# Patient Record
Sex: Male | Born: 1960 | Race: White | Hispanic: No | Smoking: Former smoker
Health system: Southern US, Community
[De-identification: ages and names within clinical notes are randomized; demographics above are authoritative.]

## PROBLEM LIST (undated history)

## (undated) DIAGNOSIS — F410 Panic disorder [episodic paroxysmal anxiety] without agoraphobia: Secondary | ICD-10-CM

## (undated) DIAGNOSIS — G8929 Other chronic pain: Secondary | ICD-10-CM

## (undated) DIAGNOSIS — F17210 Nicotine dependence, cigarettes, uncomplicated: Secondary | ICD-10-CM

## (undated) DIAGNOSIS — M549 Dorsalgia, unspecified: Secondary | ICD-10-CM

## (undated) DIAGNOSIS — F419 Anxiety disorder, unspecified: Secondary | ICD-10-CM

## (undated) DIAGNOSIS — E876 Hypokalemia: Secondary | ICD-10-CM

## (undated) HISTORY — DX: Panic disorder (episodic paroxysmal anxiety): F41.0

## (undated) HISTORY — DX: Hypokalemia: E87.6

## (undated) HISTORY — DX: Anxiety disorder, unspecified: F41.9

## (undated) HISTORY — DX: Dorsalgia, unspecified: M54.9

## (undated) HISTORY — DX: Nicotine dependence, cigarettes, uncomplicated: F17.210

## (undated) HISTORY — DX: Other chronic pain: G89.29

---

## 2005-10-04 ENCOUNTER — Emergency Department: Payer: Self-pay | Admitting: Internal Medicine

## 2008-03-07 HISTORY — PX: COLONOSCOPY: SHX174

## 2008-10-06 ENCOUNTER — Emergency Department: Payer: Self-pay | Admitting: Emergency Medicine

## 2008-12-17 ENCOUNTER — Ambulatory Visit: Payer: Self-pay

## 2008-12-30 ENCOUNTER — Ambulatory Visit: Payer: Self-pay | Admitting: Pain Medicine

## 2009-01-12 ENCOUNTER — Ambulatory Visit: Payer: Self-pay | Admitting: Gastroenterology

## 2009-01-15 ENCOUNTER — Emergency Department: Payer: Self-pay | Admitting: Emergency Medicine

## 2009-01-20 ENCOUNTER — Ambulatory Visit: Payer: Self-pay | Admitting: Physician Assistant

## 2009-02-18 ENCOUNTER — Ambulatory Visit: Payer: Self-pay | Admitting: Family Medicine

## 2009-03-24 ENCOUNTER — Ambulatory Visit: Payer: Self-pay | Admitting: Unknown Physician Specialty

## 2009-03-26 ENCOUNTER — Ambulatory Visit: Payer: Self-pay | Admitting: Unknown Physician Specialty

## 2009-07-28 ENCOUNTER — Ambulatory Visit: Payer: Self-pay | Admitting: Family Medicine

## 2009-08-27 ENCOUNTER — Ambulatory Visit: Payer: Self-pay | Admitting: Family Medicine

## 2009-10-06 ENCOUNTER — Ambulatory Visit: Payer: Self-pay | Admitting: Unknown Physician Specialty

## 2009-12-03 ENCOUNTER — Ambulatory Visit: Payer: Self-pay | Admitting: Unknown Physician Specialty

## 2009-12-08 ENCOUNTER — Inpatient Hospital Stay: Payer: Self-pay | Admitting: Unknown Physician Specialty

## 2010-10-28 ENCOUNTER — Ambulatory Visit: Payer: Self-pay

## 2011-04-28 ENCOUNTER — Ambulatory Visit: Payer: Self-pay

## 2012-08-20 ENCOUNTER — Other Ambulatory Visit (HOSPITAL_COMMUNITY): Payer: Self-pay | Admitting: Neurosurgery

## 2012-08-20 DIAGNOSIS — M545 Low back pain, unspecified: Secondary | ICD-10-CM

## 2012-08-21 ENCOUNTER — Other Ambulatory Visit: Payer: Self-pay | Admitting: Neurosurgery

## 2012-08-22 ENCOUNTER — Ambulatory Visit (HOSPITAL_COMMUNITY)
Admission: RE | Admit: 2012-08-22 | Discharge: 2012-08-22 | Disposition: A | Discharge status: Home / Self Care | Payer: Medicaid Other | Source: Ambulatory Visit | Attending: Neurosurgery | Admitting: Neurosurgery

## 2012-08-22 ENCOUNTER — Encounter (HOSPITAL_COMMUNITY): Payer: Self-pay | Admitting: Pharmacy Technician

## 2012-08-22 ENCOUNTER — Other Ambulatory Visit (HOSPITAL_COMMUNITY): Payer: Self-pay | Admitting: Neurosurgery

## 2012-08-22 DIAGNOSIS — Z981 Arthrodesis status: Secondary | ICD-10-CM | POA: Insufficient documentation

## 2012-08-22 DIAGNOSIS — M545 Low back pain: Secondary | ICD-10-CM

## 2012-08-22 DIAGNOSIS — R52 Pain, unspecified: Secondary | ICD-10-CM

## 2012-08-22 DIAGNOSIS — IMO0002 Reserved for concepts with insufficient information to code with codable children: Secondary | ICD-10-CM | POA: Insufficient documentation

## 2012-08-22 DIAGNOSIS — M5020 Other cervical disc displacement, unspecified cervical region: Secondary | ICD-10-CM | POA: Insufficient documentation

## 2012-08-22 MED ORDER — IOHEXOL 300 MG/ML  SOLN
10.0000 mL | Freq: Once | INTRAMUSCULAR | Status: AC | PRN
  Administered 2012-08-22: 10 mL via INTRATHECAL

## 2012-08-22 MED ORDER — DIAZEPAM 5 MG PO TABS: 10.0000 mg | ORAL_TABLET | Freq: Once | ORAL | Status: AC

## 2012-08-22 MED ORDER — ONDANSETRON HCL 4 MG/2ML IJ SOLN: 4.0000 mg | Freq: Four times a day (QID) | INTRAMUSCULAR | Status: DC | PRN

## 2012-08-22 MED ORDER — OXYCODONE HCL 5 MG PO TABS: 5.0000 mg | ORAL_TABLET | ORAL | Status: DC | PRN

## 2012-08-22 MED ORDER — DIAZEPAM 5 MG PO TABS
ORAL_TABLET | ORAL | Status: AC
  Administered 2012-08-22: 10 mg via ORAL
  Filled 2012-08-22: qty 2

## 2012-08-22 NOTE — Op Note (Signed)
*   No surgery found * Lumbar Myelogram  PATIENT:  Lucas Burke is a 52 y.o. male With pain in the cervical and lumbar regions. He has agreed to undergo an elective myelogram for diagnostic purposes. PRE-OPERATIVE DIAGNOSIS:  Cervicalgia, lumbago  POST-OPERATIVE DIAGNOSIS:  Cervicalgia, lumbago  PROCEDURE:  Lumbar Myelogram  SURGEON:  Adeleine Pask  ANESTHESIA:   local LOCAL MEDICATIONS USED:  LIDOCAINE  and Amount: 4 ml Procedure Note: Lucas Burke was positioned prone on the fluoroscopy table . His back was prepped and draped in a sterile manner. I infiltrated lidocaine into my proposed injection site. With fluoroscopic guidance i placed the needle into the thecal sac. I injected 10cc 300 omnipaque into the thecal sac. I confirmed this with a spot film. Lucas Burke tolerated this procedure without difficulty.    PATIENT DISPOSITION:  PACU - hemodynamically stable.

## 2013-09-12 ENCOUNTER — Encounter: Payer: Self-pay | Admitting: Podiatry

## 2013-10-01 ENCOUNTER — Ambulatory Visit (INDEPENDENT_AMBULATORY_CARE_PROVIDER_SITE_OTHER): Payer: Medicaid Other | Admitting: Podiatry

## 2013-10-01 ENCOUNTER — Encounter: Payer: Self-pay | Admitting: Podiatry

## 2013-10-01 ENCOUNTER — Ambulatory Visit: Payer: Self-pay

## 2013-10-01 VITALS — BP 134/86 | HR 62 | Resp 12

## 2013-10-01 DIAGNOSIS — G5752 Tarsal tunnel syndrome, left lower limb: Secondary | ICD-10-CM

## 2013-10-01 DIAGNOSIS — R52 Pain, unspecified: Secondary | ICD-10-CM

## 2013-10-01 DIAGNOSIS — M7750 Other enthesopathy of unspecified foot: Secondary | ICD-10-CM

## 2013-10-01 MED ORDER — TRIAMCINOLONE ACETONIDE 10 MG/ML IJ SUSP
10.0000 mg | Freq: Once | INTRAMUSCULAR | Status: AC
  Administered 2013-10-01: 10 mg

## 2013-10-01 NOTE — Progress Notes (Signed)
Subjective:     Patient ID: Lucas Burke, male   DOB: [birth year], 53 y.o.   MRN: [id number]  Foot Pain   patient presents stating I'm having a lot of pain in my left ankle over my right and I am due to have my back worked on again and I wanted to see if these may be related. States it's been going on for a fairly long time but it seems to be worse recently   Review of Systems  All other systems reviewed and are negative.      Objective:   Physical Exam  Nursing note and vitals reviewed. Constitutional: He is oriented to person, place, and time.  Cardiovascular: Intact distal pulses.   Musculoskeletal: Normal range of motion.  Neurological: He is oriented to person, place, and time.  Skin: Skin is warm and dry.   neurovascular status found to be intact and muscle strength was noted to be moderately diminished on both sides with diminishment of sharp dull and vibratory testing. 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 I did do an injection into the left tarsal tunnel and into the posterior tibial tendon sheath to try to reduce inflammation and placed into a fascially brace which he stated helped him quite a bit. We may be able to help him that if this is localized but it's hard to make any kind of determination this early as to wear this will go long-term

## 2013-10-01 NOTE — Progress Notes (Signed)
   Subjective:    Patient ID: Lucas Burke, male    DOB: 1960-09-18, 53 y.o.   MRN: [id number]  HPI PT STATED BOTH ANKLE ARE PAINFUL AND HAVE BURNING SENSATION. FEET ARE WORSE NOT GETTING BETTER. THE FEET GET AGGRAVATED BY WALKING. TRIED TO TAKE IBUPROFEN BUT NO HELP.   Review of Systems  All other systems reviewed and are negative.      Objective:   Physical Exam        Assessment & Plan:

## 2013-10-22 ENCOUNTER — Ambulatory Visit: Payer: Medicaid Other | Admitting: Podiatry

## 2015-07-06 ENCOUNTER — Ambulatory Visit
Admission: RE | Admit: 2015-07-06 | Discharge: 2015-07-06 | Disposition: A | Discharge status: Home / Self Care | Payer: Medicaid Other | Source: Ambulatory Visit | Attending: Nurse Practitioner | Admitting: Nurse Practitioner

## 2015-07-06 ENCOUNTER — Other Ambulatory Visit: Payer: Self-pay | Admitting: Nurse Practitioner

## 2015-07-06 DIAGNOSIS — M79672 Pain in left foot: Secondary | ICD-10-CM

## 2015-07-06 DIAGNOSIS — M7732 Calcaneal spur, left foot: Secondary | ICD-10-CM | POA: Insufficient documentation

## 2016-09-27 ENCOUNTER — Emergency Department
Admission: EM | Admit: 2016-09-27 | Discharge: 2016-09-27 | Disposition: A | Discharge status: Home / Self Care | Payer: Medicaid Other | Attending: Emergency Medicine | Admitting: Emergency Medicine

## 2016-09-27 DIAGNOSIS — Z5321 Procedure and treatment not carried out due to patient leaving prior to being seen by health care provider: Secondary | ICD-10-CM | POA: Diagnosis not present

## 2016-09-27 DIAGNOSIS — J029 Acute pharyngitis, unspecified: Secondary | ICD-10-CM | POA: Diagnosis present

## 2016-09-27 NOTE — ED Triage Notes (Signed)
PT reports having had a double ear infection that was treated at [facility]. PT had right ear drum "poped" to relieve swelling. Pt reports pain in ears remains at this time but has improved slightly. Pt also now reports swelling in the right side of neck and nasal drainage. Pt denies sore throat "on the inside" but pain in neck when swallowing. Pt denies fever. No SOB or difficulty breathing. Pt reports he has been taking medications as prescribed for ear infection.

## 2019-01-30 ENCOUNTER — Emergency Department
Admission: EM | Admit: 2019-01-30 | Discharge: 2019-01-30 | Disposition: A | Discharge status: Home / Self Care | Payer: Medicaid Other | Attending: Emergency Medicine | Admitting: Emergency Medicine

## 2019-01-30 ENCOUNTER — Other Ambulatory Visit: Payer: Self-pay

## 2019-01-30 ENCOUNTER — Emergency Department: Payer: Medicaid Other

## 2019-01-30 ENCOUNTER — Encounter: Payer: Self-pay | Admitting: Emergency Medicine

## 2019-01-30 DIAGNOSIS — Y999 Unspecified external cause status: Secondary | ICD-10-CM | POA: Diagnosis not present

## 2019-01-30 DIAGNOSIS — Y929 Unspecified place or not applicable: Secondary | ICD-10-CM | POA: Insufficient documentation

## 2019-01-30 DIAGNOSIS — R42 Dizziness and giddiness: Secondary | ICD-10-CM | POA: Diagnosis not present

## 2019-01-30 DIAGNOSIS — Y939 Activity, unspecified: Secondary | ICD-10-CM | POA: Diagnosis not present

## 2019-01-30 DIAGNOSIS — W208XXA Other cause of strike by thrown, projected or falling object, initial encounter: Secondary | ICD-10-CM | POA: Diagnosis not present

## 2019-01-30 DIAGNOSIS — S0101XA Laceration without foreign body of scalp, initial encounter: Secondary | ICD-10-CM | POA: Diagnosis not present

## 2019-01-30 DIAGNOSIS — S0990XA Unspecified injury of head, initial encounter: Secondary | ICD-10-CM

## 2019-01-30 DIAGNOSIS — F17210 Nicotine dependence, cigarettes, uncomplicated: Secondary | ICD-10-CM | POA: Insufficient documentation

## 2019-01-30 MED ORDER — SODIUM CHLORIDE 0.9 % IV BOLUS: 1000.0000 mL | Freq: Once | INTRAVENOUS | Status: DC

## 2019-01-30 NOTE — Discharge Instructions (Addendum)
Please seek medical attention for any high fevers, chest pain, shortness of breath, change in behavior, persistent vomiting, bloody stool or any other new or concerning symptoms.  

## 2019-01-30 NOTE — ED Provider Notes (Addendum)
[facility] Emergency Department Provider Note   ____________________________________________   I have reviewed the triage vital signs and the nursing notes.   HISTORY  Chief Complaint Head Injury, Head Laceration, and Dizziness   History limited by: Not Limited   HPI Lucas Burke is a 58 y.o. male who presents to the emergency department today because of concern for head injury and dizziness. The patient was putting up christmas decorations when he placed a hammer on the top rung of the ladder. He forgot about it and when he went to move the ladder it fell and hit him on the top of his head. The patient did not lose consciousness nor did he fall to the ground. He did notice some bleeding so applied pressure. Roughly 10 minutes later he started feeling dizzy and light headed, felt like he might pass out. Did have some nausea. Never lost consciousness. Also started developing some neck pain. Denies any recent illness or fevers.    Records reviewed. Per medical record review patient has a history of anxiety, chronic back pain.   Past Medical History:  Diagnosis Date  . Anxiety   . Chronic back pain   . Cigarette smoker   . Hypokalemia   . Panic attacks     There are no active problems to display for this patient.   Past Surgical History:  Procedure Laterality Date  . COLONOSCOPY  2010    Prior to Admission medications   Medication Sig Start Date End Date Taking? Authorizing Provider  gabapentin (NEURONTIN) 300 MG capsule Take 300 mg by mouth 2 (two) times daily.    [provider]  ibuprofen (ADVIL,MOTRIN) 400 MG tablet Take 800 mg by mouth every 6 (six) hours as needed for pain.    [provider]  PARoxetine (PAXIL) 10 MG tablet Take 10 mg by mouth every morning.    [provider]  vitamin B-12 (CYANOCOBALAMIN) 1000 MCG tablet Take 1,000 mcg by mouth daily.    [provider]    Allergies Codeine  No  family history on file.  Social History Social History   Tobacco Use  . Smoking status: Current Every Day Smoker    Packs/day: 2.00  . Smokeless tobacco: Never Used  Substance Use Topics  . Alcohol use: Not Currently  . Drug use: Not on file    Review of Systems Constitutional: No fever/chills Eyes: No visual changes. ENT: No sore throat. Cardiovascular: Denies chest pain. Respiratory: Denies shortness of breath. Gastrointestinal: No abdominal pain.  No nausea, no vomiting.  No diarrhea.   Genitourinary: Negative for dysuria. Musculoskeletal: Positive for neck pain.  Skin: Positive for scalp laceration. Neurological: Positive for headache.  ____________________________________________   PHYSICAL EXAM:  VITAL SIGNS: ED Triage Vitals  Enc Vitals Group     BP 01/30/19 1449 (!) 91/56     Pulse Rate 01/30/19 1449 (!) 57     Resp 01/30/19 1449 14     Temp 01/30/19 1449 97.8 F (36.6 C)     Temp Source 01/30/19 1449 Oral     SpO2 01/30/19 1449 100 %     Weight 01/30/19 1459 175 lb (79.4 kg)     Height 01/30/19 1459 6' (1.829 m)     Head Circumference --      Peak Flow --      Pain Score 01/30/19 1458 0   Constitutional: Alert and oriented.  Eyes: Conjunctivae are normal.  ENT      Head:  Normocephalic and atraumatic.      Nose: No congestion/rhinnorhea.      Mouth/Throat: Mucous membranes are moist.      Neck: No stridor. No midline tenderness.  Hematological/Lymphatic/Immunilogical: No cervical lymphadenopathy. Cardiovascular: Normal rate, regular rhythm.  No murmurs, rubs, or gallops.  Respiratory: Normal respiratory effort without tachypnea nor retractions. Breath sounds are clear and equal bilaterally. No wheezes/rales/rhonchi. Gastrointestinal: Soft and non tender. No rebound. No guarding.  Genitourinary: Deferred Musculoskeletal: Normal range of motion in all extremities. No lower extremity edema. Neurologic:  Normal speech and language. No gross focal  neurologic deficits are appreciated.  Skin:  Roughly 1 cm non bleeding laceration noted to apex of scalp.  Psychiatric: Mood and affect are normal. Speech and behavior are normal. Patient exhibits appropriate insight and judgment.  ____________________________________________    LABS (pertinent positives/negatives)  None  ____________________________________________   EKG  I, Nance Pear, attending physician, personally viewed and interpreted this EKG  EKG Time: 1500 Rate: 57 Rhythm: sinus rhythm Axis: normal Intervals: qtc 405 QRS: narrow ST changes: no st elevation Impression: normal ekg  ____________________________________________    RADIOLOGY  CT head/cervical spine No acute traumatic injuries.   ____________________________________________   PROCEDURES  Procedures  ____________________________________________   INITIAL IMPRESSION / ASSESSMENT AND PLAN / ED COURSE  Pertinent labs & imaging results that were available during my care of the patient were reviewed by me and considered in my medical decision making (see chart for details).   Patient presented to the emergency department today because of concern for head injury after a hammer fell from a hammer and hit him on the top of his head. Was also complaining of some neck pain. CT scan of the head and neck negative for any acute traumatic finding. Patient did feel better while here in the emergency department. Discussed concern for possible concussion and brain rest.   Blood work had been ordered however patient did request discharge prior to blood work resulting. At this point I do think it is reasonable to discharge prior to blood work. Blood pressure did improve and I think it would be unlikely that we would find a concerning abnormality given history of symptoms starting after trauma.  ____________________________________________   FINAL CLINICAL IMPRESSION(S) / ED DIAGNOSES  Final diagnoses:   Injury of head, initial encounter  Laceration of scalp, initial encounter     Note: This dictation was prepared with Dragon dictation. Any transcriptional errors that result from this process are unintentional     Nance Pear, MD 01/30/19 1716    Nance Pear, MD 01/30/19 [phone]

## 2019-01-30 NOTE — ED Triage Notes (Signed)
Pt here via EMS from home after being hit in the top of his head with a hammer which fell from the top of a ladder about 10 feet. Pt states he is dizzy, has blurred vision bilaterally, while in triage, pt became pale and diaphoretic. Lac to top of head noted with bleeding controlled. Alert and oriented. NAD.

## 2019-01-30 NOTE — ED Triage Notes (Signed)
Per ems patient got hit in head with hammer.  Bleeding controlled.  hypotensicve  84/44 with NS running wide open in LFA 18 g IV.

## 2019-01-30 NOTE — ED Notes (Signed)
IV hep lock d/c'd. Steri strips placed on head laC. VSS. PATIENT DISCHARGED TO HOME.

## 2019-05-22 ENCOUNTER — Encounter: Payer: Self-pay | Admitting: Pain Medicine

## 2019-05-22 NOTE — Progress Notes (Signed)
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 impingement.  L4-5: Solid fusion.  Adequate posterior decompression.  No residual neural compression.  Hardware intact.  L5-S1: Solid fusion.  At a posterior decompression.  No residual neural compression.  Hardware intact.  IMPRESSION: Solid L4-S1 fusion.  Severe adjacent segment disease L3-L4 as described, with left greater than right L4 and L3 nerve root impingement.   Original Report Authenticated By: Rolla Flatten, M.D.    Foot Imaging: Foot-L DG Complete:  Results for orders placed during the hospital encounter of 07/06/15  DG Foot Complete Left   Narrative CLINICAL DATA:  Left heel pain and swelling after hitting the heel 4 days ago.  EXAM: LEFT FOOT - COMPLETE 3+ VIEW  COMPARISON:  Left ankle dated 10/01/2013  FINDINGS: Mild posterior and minimal inferior calcaneal spur formation. There is also linear calcification in the distal Achilles tendon. No fracture or dislocation seen. No effusion.  IMPRESSION: 1. No  fracture. 2. Interval calcaneal spurs. 3. Interval distal Achilles calcific tendinosis.   Electronically Signed   By: Claudie Revering M.D.   On: 07/06/2015 15:32  Complexity Note: Imaging results reviewed. Results shared with Mr. Noda, using Layman's terms.                        ROS  Cardiovascular: No reported cardiovascular signs or symptoms such as High blood pressure, coronary artery disease, abnormal heart rate or rhythm, heart attack, blood thinner therapy or heart weakness and/or failure Pulmonary or Respiratory: Smoking Neurological: No reported neurological signs or symptoms such as seizures, abnormal skin sensations, urinary and/or fecal incontinence, being born with an abnormal open spine and/or a tethered spinal cord Psychological-Psychiatric: Anxiousness, Depressed and Prone to panicking Gastrointestinal: No reported gastrointestinal signs or symptoms such as vomiting or evacuating blood, reflux, heartburn, alternating episodes of diarrhea and constipation, inflamed or scarred liver, or pancreas or irrregular and/or infrequent bowel movements Genitourinary: No reported renal or genitourinary signs or symptoms such as difficulty voiding or producing urine, peeing blood, non-functioning kidney, kidney stones, difficulty emptying the bladder, difficulty controlling the flow of urine, or chronic kidney disease Hematological: No reported hematological signs or symptoms such as prolonged bleeding, low or poor functioning platelets, bruising or bleeding easily, hereditary bleeding problems, low energy levels due to low hemoglobin or being anemic Endocrine: No reported endocrine signs or symptoms such as high or low blood sugar, rapid heart rate due to high thyroid levels, obesity or weight gain due to slow thyroid or thyroid disease Rheumatologic: No reported rheumatological signs and symptoms such as fatigue, joint pain, tenderness, swelling, redness, heat, stiffness, decreased range  of motion, with or without associated rash Musculoskeletal: Negative for myasthenia gravis, muscular dystrophy, multiple sclerosis or malignant hyperthermia Work History: Disabled  Allergies  Mr. Habermann is allergic to codeine.  Laboratory Chemistry Profile   Renal No results found for: BUN, CREATININE, LABCREA, BCR, GFR, GFRAA, GFRNONAA, SPECGRAV, PHUR, PROTEINUR  Electrolytes No results found for: NA, K, CL, CALCIUM, MG, PHOS  Hepatic No results found for: AST, ALT, ALBUMIN, ALKPHOS, AMYLASE, LIPASE, AMMONIA  ID No results found for: LYMEIGGIGMAB, HIV, SARSCOV2NAA, STAPHAUREUS, MRSAPCR, HCVAB, PREGTESTUR, RMSFIGG, QFVRPH1IGG, QFVRPH2IGG, LYMEIGGIGMAB  Bone No results found for: VD25OH, GX211HE1DEY, CX4481EH6, DJ4970YO3, 25OHVITD1, 25OHVITD2, 25OHVITD3, TESTOFREE, TESTOSTERONE  Endocrine No results found for: GLUCOSE, GLUCOSEU, HGBA1C, TSH, FREET4, TESTOFREE, TESTOSTERONE, SHBG, ESTRADIOL, ESTRADIOLPCT, ESTRADIOLFRE, LABPREG, ACTH, CRTSLPL, UCORFRPERLTR, UCORFRPERDAY, CORTISOLBASE, LABPREG  Neuropathy No results found for: VITAMINB12, FOLATE, HGBA1C, HIV  CNS No results found for: COLORCSF, APPEARCSF, RBCCOUNTCSF, WBCCSF, POLYSCSF, LYMPHSCSF, EOSCSF, PROTEINCSF, GLUCCSF, JCVIRUS, CSFOLI, IGGCSF, LABACHR, ACETBL, LABACHR, ACETBL  Inflammation (CRP: Acute  ESR: Chronic) No results found for: CRP, ESRSEDRATE, LATICACIDVEN  Rheumatology No results found for: RF, ANA, LABURIC, URICUR, LYMEIGGIGMAB, LYMEABIGMQN, HLAB27  Coagulation No results found for: INR, LABPROT, APTT, PLT, DDIMER, LABHEMA, VITAMINK1, AT3  Cardiovascular No results found for: BNP, CKTOTAL, CKMB, TROPONINI, HGB, HCT, LABVMA, EPIRU, EPINEPH24HUR, NOREPRU, NOREPI24HUR, DOPARU, DOPAM24HRUR  Screening No results found for: SARSCOV2NAA, COVIDSOURCE, STAPHAUREUS, MRSAPCR, HCVAB, HIV, PREGTESTUR  Cancer No results found for: CEA, CA125, LABCA2  Allergens No results found for: ALMOND, APPLE, ASPARAGUS, AVOCADO, BANANA, BARLEY,  BASIL, BAYLEAF, GREENBEAN, LIMABEAN, WHITEBEAN, BEEFIGE, REDBEET, BLUEBERRY, BROCCOLI, CABBAGE, MELON, CARROT, CASEIN, CASHEWNUT, CAULIFLOWER, CELERY    Note: Lab results reviewed.   [place]  Drug: Mr. Gandolfo  has no history on file for drug. Alcohol:  reports previous alcohol use. Tobacco:  reports that he has been smoking. He has been smoking about 2.00 packs per day. He has never used smokeless tobacco. Medical:  has a past medical history of Anxiety, Chronic  back pain, Cigarette smoker, Hypokalemia, and Panic attacks. Family: family history is not on file.  Past Surgical History:  Procedure Laterality Date  . COLONOSCOPY  2010   Active Ambulatory Problems    Diagnosis Date Noted  . Chronic pain syndrome 05/23/2019  . Pharmacologic therapy 05/23/2019  . Disorder of skeletal system 05/23/2019  . Problems influencing health status 05/23/2019  . Long term prescription benzodiazepine use (Alprazolam) 05/23/2019  . Abnormal CT scan, cervical spine (01/30/2019) 05/23/2019  . DDD (degenerative disc disease), cervical 05/23/2019  . Cervical facet arthropathy (Multilevel) (Bilateral) 05/23/2019  . Cervical foraminal stenosis 05/23/2019  . Abnormal myelogram of cervical spine (2014) 05/23/2019  . Cervical Grade 1 Anterolisthesis of C3/C4 05/23/2019  . Abnormal CT myelogram of lumbar spine (2014) 05/23/2019  . Chronic low back pain (2ry area of Pain) (Bilateral) (L>R) 05/23/2019  . Chronic lower extremity pain (1ry area of Pain) (Bilateral) (L>R) 05/23/2019  . Chronic lumbar radiculitis (Left) (L4) 05/23/2019  . Chronic neck pain (3ry area of Pain) (Left) 05/23/2019  . Cervicalgia 05/23/2019  . Chronic thoracic back pain (Midline) 05/23/2019  . Chronic hip pain (Left) 05/23/2019  . Chronic groin pain (Left) 05/23/2019   Resolved Ambulatory Problems    Diagnosis Date Noted  . No Resolved Ambulatory Problems   Past Medical History:  Diagnosis Date  . Anxiety   . Chronic back pain    . Cigarette smoker   . Hypokalemia   . Panic attacks    Constitutional Exam  General appearance: Well nourished, well developed, and well hydrated. In no apparent acute distress Vitals:   05/22/19 1010  BP: 125/84  Pulse: (!) 58  Resp: 18  Temp: 98 F (36.7 C)  SpO2: 97%  Weight: 175 lb (79.4 kg)  Height: 6' (1.829 m)   BMI Assessment: Estimated body mass index is 23.73 kg/m as calculated from the following:   Height as of this encounter: 6' (1.829 m).   Weight as of this encounter: 175 lb (79.4 kg).  BMI interpretation table: BMI level Category Range association with higher incidence of chronic pain  <18 kg/m2 Underweight   18.5-24.9 kg/m2 Ideal body weight   25-29.9 kg/m2 Overweight Increased incidence by 20%  30-34.9 kg/m2 Obese (Class I) Increased incidence by 68%  35-39.9 kg/m2 Severe obesity (Class II) Increased incidence by 136%  >40 kg/m2 Extreme obesity (Class III) Increased incidence by 254%   Patient's current BMI Ideal Body weight  Body mass index is 23.73 kg/m. Ideal body weight: 77.6 kg (171 lb 1.2 oz) Adjusted ideal body weight: 78.3 kg (172 lb 10.3 oz)   BMI Readings from Last 4 Encounters:  05/22/19 23.73 kg/m  01/30/19 23.73 kg/m  09/27/16 26.31 kg/m  08/22/12 30.52 kg/m   Wt Readings from Last 4 Encounters:  05/22/19 175 lb (79.4 kg)  01/30/19 175 lb (79.4 kg)  09/27/16 194 lb (88 kg)  08/22/12 225 lb (102.1 kg)    Psych/Mental status: Alert, oriented x 3 (person, place, & time)       Eyes: PERLA Respiratory: No evidence of acute respiratory distress  Cervical Spine Exam  Skin & Axial Inspection: No masses, redness, edema, swelling, or associated skin lesions Alignment: Symmetrical Functional ROM: Decreased ROMHe indicated experiencing some pain on the left side of the neck when turning or rotating his head towards the right as well as with hyperextension. Stability: No instability detected Muscle Tone/Strength: Functionally intact.  No obvious neuro-muscular anomalies detected. Sensory (Neurological): Unimpaired Palpation: No palpable anomalies  Upper Extremity (UE) Exam    Side: Right upper extremity  Side: Left upper extremity  Skin & Extremity Inspection: Skin color, temperature, and hair growth are WNL. No peripheral edema or cyanosis. No masses, redness, swelling, asymmetry, or associated skin lesions. No contractures.  Skin & Extremity Inspection: Skin color, temperature, and hair growth are WNL. No peripheral edema or cyanosis. No masses, redness, swelling, asymmetry, or associated skin lesions. No contractures.  Functional ROM: Unrestricted ROM          Functional ROM: Unrestricted ROM          Muscle Tone/Strength: Functionally intact. No obvious neuro-muscular anomalies detected.  Muscle Tone/Strength: Functionally intact. No obvious neuro-muscular anomalies detected.  Sensory (Neurological): Unimpaired          Sensory (Neurological): Unimpaired          Palpation: No palpable anomalies              Palpation: No palpable anomalies              Provocative Test(s):  Phalen's test: deferred Tinel's test: deferred Apley's scratch test (touch opposite shoulder):  Action 1 (Across chest): deferred Action 2 (Overhead): deferred Action 3 (LB reach): deferred   Provocative Test(s):  Phalen's test: deferred Tinel's test: deferred Apley's scratch test (touch opposite shoulder):  Action 1 (Across chest): deferred Action 2 (Overhead): deferred Action 3 (LB reach): deferred    Thoracic Spine Area Exam  Skin & Axial Inspection: No masses, redness, or swelling Alignment: Symmetrical Functional ROM: Unrestricted ROM Stability: No instability detected Muscle Tone/Strength: Functionally intact. No obvious neuro-muscular anomalies detected. Sensory (Neurological): Unimpaired Muscle strength & Tone: No palpable anomalies  Lumbar Exam  Skin & Axial Inspection: No masses, redness, or swelling Alignment:  Symmetrical Functional ROM: Minimal ROM affecting both sides Stability: No instability detected Muscle Tone/Strength: Guarding detected Sensory (Neurological): Movement-associated pain Palpation: Complains of area being tender to palpation       Provocative Tests: Hyperextension/rotation test: (+) bilaterally for facet joint pain. Lumbar quadrant test (Kemp's test): (+) bilaterally for facet joint pain. Lateral bending test: deferred today       Patrick's Maneuver: (+) for left hip arthralgia             FABER* test: (+) for left hip arthralgia             S-I anterior distraction/compression test: deferred today         S-I lateral compression test: deferred today         S-I Thigh-thrust test: deferred today         S-I Gaenslen's test: deferred today         *(Flexion, ABduction and External Rotation)  Gait & Posture Assessment  Ambulation: Unassisted Gait: Relatively normal for age and body habitus Posture: WNL   Lower Extremity Exam    Side: Right lower extremity  Side: Left lower extremity  Stability: No instability observed          Stability: No instability observed          Skin & Extremity Inspection: Skin color, temperature, and hair growth are WNL. No peripheral edema or cyanosis. No masses, redness, swelling, asymmetry, or associated skin lesions. No contractures.  Skin & Extremity Inspection: Skin color, temperature, and hair growth are WNL. No peripheral edema or cyanosis. No masses, redness, swelling, asymmetry, or associated skin lesions. No contractures.  Functional ROM: Unrestricted ROM  Functional ROM: Unrestricted ROM                  Muscle Tone/Strength: Able to Toe-walk & Heel-walk without problems  Muscle Tone/Strength: Able to Toe-walk & Heel-walk without problems  Sensory (Neurological): Unimpaired        Sensory (Neurological): Dermatomal pain pattern medial portion of foot (L4)  DTR: Patellar: deferred today Achilles: deferred  today Plantar: deferred today  DTR: Patellar: deferred today Achilles: deferred today Plantar: deferred today  Palpation: No palpable anomalies  Palpation: No palpable anomalies   Assessment  Primary Diagnosis & Pertinent Problem List: The primary encounter diagnosis was Chronic pain syndrome. Diagnoses of Chronic lower extremity pain (1ry area of Pain) (Bilateral) (L>R), Abnormal CT myelogram of lumbar spine (2014), Chronic lumbar radiculitis (Left) (L4), Chronic groin pain (Left), Chronic hip pain (Left), Chronic low back pain (2ry area of Pain) (Bilateral) (L>R), Chronic neck pain (3ry area of Pain) (Left), Cervicalgia, Abnormal myelogram of cervical spine (2014), Abnormal CT scan, cervical spine (01/30/2019), DDD (degenerative disc disease), cervical, Cervical Grade 1 Anterolisthesis of C3/C4, Cervical facet arthropathy (Multilevel) (Bilateral), Cervical foraminal stenosis, Chronic thoracic back pain (Midline), Pharmacologic therapy, Disorder of skeletal system, Problems influencing health status, and Long term prescription benzodiazepine use (Alprazolam) were also pertinent to this visit.  Visit Diagnosis (New problems to examiner): 1. Chronic pain syndrome   2. Chronic lower extremity pain (1ry area of Pain) (Bilateral) (L>R)   3. Abnormal CT myelogram of lumbar spine (2014)   4. Chronic lumbar radiculitis (Left) (L4)   5. Chronic groin pain (Left)   6. Chronic hip pain (Left)   7. Chronic low back pain (2ry area of Pain) (Bilateral) (L>R)   8. Chronic neck pain (3ry area of Pain) (Left)   9. Cervicalgia   10. Abnormal myelogram of cervical spine (2014)   11. Abnormal CT scan, cervical spine (01/30/2019)   12. DDD (degenerative disc disease), cervical   13. Cervical Grade 1 Anterolisthesis of C3/C4   14. Cervical facet arthropathy (Multilevel) (Bilateral)   15. Cervical foraminal stenosis   16. Chronic thoracic back pain (Midline)   17. Pharmacologic therapy   18. Disorder of  skeletal system   19. Problems influencing health status   20. Long term prescription benzodiazepine use (Alprazolam)    Plan of Care (Initial workup plan)  Note: Mr. Parham was reminded that as per protocol, today's visit has been an evaluation only. We have not taken over the patient's controlled substance management.  Problem-specific plan: No problem-specific Assessment & Plan notes found for this encounter.   Lab Orders     Compliance Drug Analysis, Ur     Comp. Metabolic Panel (12)     Magnesium     Vitamin B12     Sedimentation rate     25-Hydroxy vitamin D Lcms D2+D3     C-reactive protein  Imaging Orders     DG HIP UNILAT W OR W/O PELVIS 2-3 VIEWS LEFT     DG Lumbar Spine Complete W/Bend     CT LUMBAR SPINE W CONTRAST     DG Thoracic Spine 2 View Referral Orders  No referral(s) requested today   Procedure Orders    No procedure(s) ordered today   Pharmacotherapy (current): Medications ordered:  No orders of the defined types were placed in this encounter.  Medications administered during this visit: Lucas Burke had no medications administered during this visit.   Pharmacological management options:  Opioid Analgesics: The patient was  informed that there is no guarantee that he would be a candidate for opioid analgesics. The decision will be made following CDC guidelines. This decision will be based on the results of diagnostic studies, as well as Mr. Mander risk profile.   Membrane stabilizer: To be determined at a later time  Muscle relaxant: To be determined at a later time  NSAID: To be determined at a later time  Other analgesic(s): To be determined at a later time   Interventional management options: Mr. Emma was informed that there is no guarantee that he would be a candidate for interventional therapies. The decision will be based on the results of diagnostic studies, as well as Mr. Harman risk profile.  Procedure(s) under consideration:   Diagnostic left caudal ESI + epidurogram  Diagnostic left IA hip joint injection  Diagnostic bilateral lumbar facet block    Provider-requested follow-up: Return for (VV), (2V), (s/p Tests).  No future appointments.  Note by: Gaspar Cola, MD Date: 05/23/2019; Time: 2:29 PM

## 2019-05-23 ENCOUNTER — Other Ambulatory Visit: Payer: Self-pay

## 2019-05-23 ENCOUNTER — Ambulatory Visit: Payer: Medicaid Other | Attending: Pain Medicine | Admitting: Pain Medicine

## 2019-05-23 ENCOUNTER — Encounter: Payer: Self-pay | Admitting: Pain Medicine

## 2019-05-23 VITALS — BP 125/84 | HR 58 | Temp 98.0°F | Resp 18 | Ht 72.0 in | Wt 175.0 lb

## 2019-05-23 DIAGNOSIS — M4310 Spondylolisthesis, site unspecified: Secondary | ICD-10-CM

## 2019-05-23 DIAGNOSIS — G8929 Other chronic pain: Secondary | ICD-10-CM

## 2019-05-23 DIAGNOSIS — M5416 Radiculopathy, lumbar region: Secondary | ICD-10-CM | POA: Diagnosis present

## 2019-05-23 DIAGNOSIS — R9089 Other abnormal findings on diagnostic imaging of central nervous system: Secondary | ICD-10-CM | POA: Insufficient documentation

## 2019-05-23 DIAGNOSIS — M542 Cervicalgia: Secondary | ICD-10-CM | POA: Insufficient documentation

## 2019-05-23 DIAGNOSIS — M79604 Pain in right leg: Secondary | ICD-10-CM

## 2019-05-23 DIAGNOSIS — M47812 Spondylosis without myelopathy or radiculopathy, cervical region: Secondary | ICD-10-CM | POA: Diagnosis present

## 2019-05-23 DIAGNOSIS — G894 Chronic pain syndrome: Secondary | ICD-10-CM | POA: Diagnosis present

## 2019-05-23 DIAGNOSIS — M25552 Pain in left hip: Secondary | ICD-10-CM | POA: Diagnosis present

## 2019-05-23 DIAGNOSIS — M4802 Spinal stenosis, cervical region: Secondary | ICD-10-CM | POA: Diagnosis present

## 2019-05-23 DIAGNOSIS — M899 Disorder of bone, unspecified: Secondary | ICD-10-CM

## 2019-05-23 DIAGNOSIS — R937 Abnormal findings on diagnostic imaging of other parts of musculoskeletal system: Secondary | ICD-10-CM

## 2019-05-23 DIAGNOSIS — Z79899 Other long term (current) drug therapy: Secondary | ICD-10-CM

## 2019-05-23 DIAGNOSIS — M5030 Other cervical disc degeneration, unspecified cervical region: Secondary | ICD-10-CM | POA: Diagnosis present

## 2019-05-23 DIAGNOSIS — M5442 Lumbago with sciatica, left side: Secondary | ICD-10-CM

## 2019-05-23 DIAGNOSIS — M546 Pain in thoracic spine: Secondary | ICD-10-CM

## 2019-05-23 DIAGNOSIS — Z789 Other specified health status: Secondary | ICD-10-CM

## 2019-05-23 DIAGNOSIS — M79605 Pain in left leg: Secondary | ICD-10-CM

## 2019-05-23 DIAGNOSIS — R1032 Left lower quadrant pain: Secondary | ICD-10-CM

## 2019-05-23 NOTE — Progress Notes (Signed)
Safety precautions to be maintained throughout the outpatient stay will include: orient to surroundings, keep bed in low position, maintain call bell within reach at all times, provide assistance with transfer out of bed and ambulation.  

## 2019-05-28 ENCOUNTER — Other Ambulatory Visit: Payer: Self-pay | Admitting: Pain Medicine

## 2019-05-28 DIAGNOSIS — F1290 Cannabis use, unspecified, uncomplicated: Secondary | ICD-10-CM | POA: Insufficient documentation

## 2019-05-28 LAB — COMPLIANCE DRUG ANALYSIS, UR

## 2019-05-29 LAB — COMP. METABOLIC PANEL (12)
AST: 17 IU/L (ref 0–40)
Albumin/Globulin Ratio: 1.6 (ref 1.2–2.2)
Albumin: 4.4 g/dL (ref 3.8–4.9)
Alkaline Phosphatase: 105 IU/L (ref 39–117)
BUN/Creatinine Ratio: 11 (ref 10–24)
BUN: 16 mg/dL (ref 8–27)
Bilirubin Total: 0.3 mg/dL (ref 0.0–1.2)
Calcium: 10.1 mg/dL (ref 8.6–10.2)
Chloride: 103 mmol/L (ref 96–106)
Creatinine, Ser: 1.42 mg/dL — ABNORMAL HIGH (ref 0.76–1.27)
GFR calc Af Amer: 62 mL/min/{1.73_m2} (ref >59)
GFR calc non Af Amer: 53 mL/min/{1.73_m2} — ABNORMAL LOW (ref >59)
Globulin, Total: 2.8 g/dL (ref 1.5–4.5)
Glucose: 71 mg/dL (ref 65–99)
Potassium: 5 mmol/L (ref 3.5–5.2)
Sodium: 141 mmol/L (ref 134–144)
Total Protein: 7.2 g/dL (ref 6.0–8.5)

## 2019-05-29 LAB — C-REACTIVE PROTEIN: CRP: 4 mg/L (ref 0–10)

## 2019-05-29 LAB — 25-HYDROXY VITAMIN D LCMS D2+D3
25-Hydroxy, Vitamin D-2: <1 ng/mL
25-Hydroxy, Vitamin D-3: 39 ng/mL
25-Hydroxy, Vitamin D: 39 ng/mL

## 2019-05-29 LAB — MAGNESIUM: Magnesium: 1.9 mg/dL (ref 1.6–2.3)

## 2019-05-29 LAB — SEDIMENTATION RATE: Sed Rate: 21 mm/hr (ref 0–30)

## 2019-05-29 LAB — VITAMIN B12: Vitamin B-12: 463 pg/mL (ref 232–1245)

## 2021-05-22 ENCOUNTER — Other Ambulatory Visit: Payer: Self-pay

## 2021-05-22 ENCOUNTER — Inpatient Hospital Stay
Admission: EM | Admit: 2021-05-22 | Discharge: 2021-05-30 | DRG: 871 | Disposition: A | Discharge status: Other Acute Inpatient Hospital | Payer: Medicaid Other | Attending: Internal Medicine | Admitting: Internal Medicine

## 2021-05-22 ENCOUNTER — Emergency Department: Payer: Medicaid Other

## 2021-05-22 DIAGNOSIS — G8929 Other chronic pain: Secondary | ICD-10-CM | POA: Diagnosis present

## 2021-05-22 DIAGNOSIS — R5381 Other malaise: Secondary | ICD-10-CM

## 2021-05-22 DIAGNOSIS — J9601 Acute respiratory failure with hypoxia: Secondary | ICD-10-CM | POA: Diagnosis present

## 2021-05-22 DIAGNOSIS — A419 Sepsis, unspecified organism: Principal | ICD-10-CM | POA: Diagnosis present

## 2021-05-22 DIAGNOSIS — Z6821 Body mass index (BMI) 21.0-21.9, adult: Secondary | ICD-10-CM

## 2021-05-22 DIAGNOSIS — Z20822 Contact with and (suspected) exposure to covid-19: Secondary | ICD-10-CM | POA: Diagnosis present

## 2021-05-22 DIAGNOSIS — C3492 Malignant neoplasm of unspecified part of left bronchus or lung: Secondary | ICD-10-CM | POA: Diagnosis not present

## 2021-05-22 DIAGNOSIS — E46 Unspecified protein-calorie malnutrition: Secondary | ICD-10-CM | POA: Insufficient documentation

## 2021-05-22 DIAGNOSIS — J9811 Atelectasis: Secondary | ICD-10-CM | POA: Diagnosis present

## 2021-05-22 DIAGNOSIS — Z885 Allergy status to narcotic agent status: Secondary | ICD-10-CM

## 2021-05-22 DIAGNOSIS — F410 Panic disorder [episodic paroxysmal anxiety] without agoraphobia: Secondary | ICD-10-CM | POA: Diagnosis present

## 2021-05-22 DIAGNOSIS — D75839 Thrombocytosis, unspecified: Secondary | ICD-10-CM

## 2021-05-22 DIAGNOSIS — E209 Hypoparathyroidism, unspecified: Secondary | ICD-10-CM | POA: Diagnosis not present

## 2021-05-22 DIAGNOSIS — R638 Other symptoms and signs concerning food and fluid intake: Secondary | ICD-10-CM | POA: Diagnosis present

## 2021-05-22 DIAGNOSIS — J189 Pneumonia, unspecified organism: Secondary | ICD-10-CM | POA: Diagnosis present

## 2021-05-22 DIAGNOSIS — E8809 Other disorders of plasma-protein metabolism, not elsewhere classified: Secondary | ICD-10-CM | POA: Diagnosis present

## 2021-05-22 DIAGNOSIS — R443 Hallucinations, unspecified: Secondary | ICD-10-CM | POA: Diagnosis not present

## 2021-05-22 DIAGNOSIS — E611 Iron deficiency: Secondary | ICD-10-CM | POA: Diagnosis present

## 2021-05-22 DIAGNOSIS — R634 Abnormal weight loss: Secondary | ICD-10-CM | POA: Diagnosis not present

## 2021-05-22 DIAGNOSIS — F17210 Nicotine dependence, cigarettes, uncomplicated: Secondary | ICD-10-CM | POA: Diagnosis present

## 2021-05-22 DIAGNOSIS — R6520 Severe sepsis without septic shock: Secondary | ICD-10-CM | POA: Diagnosis present

## 2021-05-22 DIAGNOSIS — R17 Unspecified jaundice: Secondary | ICD-10-CM | POA: Diagnosis present

## 2021-05-22 DIAGNOSIS — E43 Unspecified severe protein-calorie malnutrition: Secondary | ICD-10-CM | POA: Diagnosis present

## 2021-05-22 DIAGNOSIS — J90 Pleural effusion, not elsewhere classified: Secondary | ICD-10-CM | POA: Diagnosis present

## 2021-05-22 DIAGNOSIS — E860 Dehydration: Secondary | ICD-10-CM | POA: Diagnosis present

## 2021-05-22 DIAGNOSIS — R451 Restlessness and agitation: Secondary | ICD-10-CM | POA: Diagnosis not present

## 2021-05-22 DIAGNOSIS — J869 Pyothorax without fistula: Secondary | ICD-10-CM | POA: Diagnosis present

## 2021-05-22 DIAGNOSIS — M549 Dorsalgia, unspecified: Secondary | ICD-10-CM | POA: Diagnosis present

## 2021-05-22 DIAGNOSIS — D509 Iron deficiency anemia, unspecified: Secondary | ICD-10-CM | POA: Diagnosis present

## 2021-05-22 DIAGNOSIS — Z79899 Other long term (current) drug therapy: Secondary | ICD-10-CM

## 2021-05-22 DIAGNOSIS — R251 Tremor, unspecified: Secondary | ICD-10-CM

## 2021-05-22 LAB — CBC WITH DIFFERENTIAL/PLATELET
Abs Immature Granulocytes: 0.22 10*3/uL — ABNORMAL HIGH (ref 0.00–0.07)
Basophils Absolute: 0.1 10*3/uL (ref 0.0–0.1)
Basophils Relative: 0 %
Eosinophils Absolute: 0 10*3/uL (ref 0.0–0.5)
Eosinophils Relative: 0 %
HCT: 37.6 % — ABNORMAL LOW (ref 39.0–52.0)
Hemoglobin: 12 g/dL — ABNORMAL LOW (ref 13.0–17.0)
Immature Granulocytes: 1 %
Lymphocytes Relative: 4 %
Lymphs Abs: 1 10*3/uL (ref 0.7–4.0)
MCH: 25.6 pg — ABNORMAL LOW (ref 26.0–34.0)
MCHC: 31.9 g/dL (ref 30.0–36.0)
MCV: 80.3 fL (ref 80.0–100.0)
Monocytes Absolute: 2.6 10*3/uL — ABNORMAL HIGH (ref 0.1–1.0)
Monocytes Relative: 10 %
Neutro Abs: 21.8 10*3/uL — ABNORMAL HIGH (ref 1.7–7.7)
Neutrophils Relative %: 85 %
Platelets: 712 10*3/uL — ABNORMAL HIGH (ref 150–400)
RBC: 4.68 MIL/uL (ref 4.22–5.81)
RDW: 13.1 % (ref 11.5–15.5)
WBC: 25.7 10*3/uL — ABNORMAL HIGH (ref 4.0–10.5)
nRBC: 0 % (ref 0.0–0.2)

## 2021-05-22 LAB — COMPREHENSIVE METABOLIC PANEL
ALT: 18 U/L (ref 0–44)
AST: 21 U/L (ref 15–41)
Albumin: 2.1 g/dL — ABNORMAL LOW (ref 3.5–5.0)
Alkaline Phosphatase: 168 U/L — ABNORMAL HIGH (ref 38–126)
Anion gap: 8 (ref 5–15)
BUN: 41 mg/dL — ABNORMAL HIGH (ref 8–23)
CO2: 27 mmol/L (ref 22–32)
Calcium: 12.7 mg/dL — ABNORMAL HIGH (ref 8.9–10.3)
Chloride: 100 mmol/L (ref 98–111)
Creatinine, Ser: 1.25 mg/dL — ABNORMAL HIGH (ref 0.61–1.24)
GFR, Estimated: >60 mL/min (ref >60)
Glucose, Bld: 110 mg/dL — ABNORMAL HIGH (ref 70–99)
Potassium: 4.5 mmol/L (ref 3.5–5.1)
Sodium: 135 mmol/L (ref 135–145)
Total Bilirubin: 1.3 mg/dL — ABNORMAL HIGH (ref 0.3–1.2)
Total Protein: 7.3 g/dL (ref 6.5–8.1)

## 2021-05-22 LAB — URINALYSIS, ROUTINE W REFLEX MICROSCOPIC
Bilirubin Urine: NEGATIVE
Glucose, UA: NEGATIVE mg/dL
Hgb urine dipstick: NEGATIVE
Ketones, ur: NEGATIVE mg/dL
Leukocytes,Ua: NEGATIVE
Nitrite: NEGATIVE
Protein, ur: NEGATIVE mg/dL
Specific Gravity, Urine: 1.024 (ref 1.005–1.030)
pH: 5 (ref 5.0–8.0)

## 2021-05-22 LAB — TROPONIN I (HIGH SENSITIVITY): Troponin I (High Sensitivity): 9 ng/L (ref <18)

## 2021-05-22 LAB — LIPASE, BLOOD: Lipase: 33 U/L (ref 11–51)

## 2021-05-22 LAB — RESP PANEL BY RT-PCR (FLU A&B, COVID) ARPGX2
Influenza A by PCR: NEGATIVE
Influenza B by PCR: NEGATIVE
SARS Coronavirus 2 by RT PCR: NEGATIVE

## 2021-05-22 MED ORDER — LACTATED RINGERS IV BOLUS
1000.0000 mL | Freq: Once | INTRAVENOUS | Status: AC
  Administered 2021-05-22: 1000 mL via INTRAVENOUS

## 2021-05-22 MED ORDER — ACETAMINOPHEN 325 MG PO TABS
650.0000 mg | ORAL_TABLET | Freq: Four times a day (QID) | ORAL | Status: DC | PRN
  Administered 2021-05-22 – 2021-05-28 (×13): 650 mg via ORAL
  Filled 2021-05-22 (×13): qty 2

## 2021-05-22 MED ORDER — ACETAMINOPHEN 650 MG RE SUPP: 650.0000 mg | Freq: Four times a day (QID) | RECTAL | Status: DC | PRN

## 2021-05-22 MED ORDER — ONDANSETRON HCL 4 MG/2ML IJ SOLN
4.0000 mg | Freq: Four times a day (QID) | INTRAMUSCULAR | Status: DC | PRN
  Administered 2021-05-23: 4 mg via INTRAVENOUS
  Filled 2021-05-22: qty 2

## 2021-05-22 MED ORDER — SODIUM CHLORIDE 0.9 % IV SOLN: 2.0000 g | INTRAVENOUS | Status: DC

## 2021-05-22 MED ORDER — ONDANSETRON HCL 4 MG PO TABS: 4.0000 mg | ORAL_TABLET | Freq: Four times a day (QID) | ORAL | Status: DC | PRN

## 2021-05-22 MED ORDER — ENOXAPARIN SODIUM 40 MG/0.4ML IJ SOSY
40.0000 mg | PREFILLED_SYRINGE | INTRAMUSCULAR | Status: DC
  Administered 2021-05-22 – 2021-05-29 (×8): 40 mg via SUBCUTANEOUS
  Filled 2021-05-22 (×8): qty 0.4

## 2021-05-22 MED ORDER — LACTATED RINGERS IV SOLN: INTRAVENOUS | Status: DC

## 2021-05-22 MED ORDER — SODIUM CHLORIDE 0.9 % IV SOLN
2.0000 g | INTRAVENOUS | Status: DC
  Administered 2021-05-22 – 2021-05-25 (×4): 2 g via INTRAVENOUS
  Filled 2021-05-22 (×4): qty 20

## 2021-05-22 MED ORDER — SODIUM CHLORIDE 0.9 % IV SOLN
500.0000 mg | INTRAVENOUS | Status: DC
  Administered 2021-05-22 – 2021-05-25 (×4): 500 mg via INTRAVENOUS
  Filled 2021-05-22 (×4): qty 5

## 2021-05-22 MED ORDER — SODIUM CHLORIDE 0.9 % IV SOLN: 500.0000 mg | INTRAVENOUS | Status: DC

## 2021-05-22 NOTE — Assessment & Plan Note (Addendum)
Management as above under unintentional weight loss.  Off IV hydration.  ?Pt reports appetite significantly improved. ?--Appreciate dietitian recommendations ?

## 2021-05-22 NOTE — Consult Note (Signed)
CODE SEPSIS - PHARMACY COMMUNICATION ? ?**Broad Spectrum Antibiotics should be administered within 1 hour of Sepsis diagnosis** ? ?Time Code Sepsis Called/Page Received: none received ?Consult entered at 2103 ? ?Antibiotics Ordered: azithromycin and ceftriaxone ? ?Time of 1st antibiotic administration: 2106 ? ?Additional action taken by pharmacy: none ? ?If necessary, Name of Provider/Nurse Contacted: n/a ? ? ?Ashanta Amoroso Rodriguez-Guzman PharmD, BCPS ?05/22/2021 9:43 PM ? ? ?

## 2021-05-22 NOTE — ED Notes (Signed)
Confirmed with alexis in lab, two sets of blood cultures are currently in lab.  ?

## 2021-05-22 NOTE — ED Notes (Signed)
Pt had incontinence of urine; bed linens changed  ?

## 2021-05-22 NOTE — Assessment & Plan Note (Addendum)
Possibly related to underlying acute illness with some concern for underlying malignant process given hypercalcemia. ?Management as outlined. ?Dietitian following and patient started on supplements and vitamins. ? ?

## 2021-05-22 NOTE — Assessment & Plan Note (Addendum)
Pain control as needed. ?Mobility as tolerated ?Nonpharmacologic pain control ?

## 2021-05-22 NOTE — ED Triage Notes (Signed)
BIB [facility] from home for drastic weight loss in the last few days. Family advised pt is not acting normal. 80s oxygen on RA per EMS.  ? ?127 ?100 BP  ?98/64 after 500 NS ?15NB ?Gabapentin PO at home ?ECG normal ?Bilateral 18G  ?Urine is very dark per family with odor.  ?

## 2021-05-22 NOTE — Assessment & Plan Note (Addendum)
PT recommended home health and rolling walker which has been arranged. ?

## 2021-05-22 NOTE — Assessment & Plan Note (Addendum)
Secondary to acute illness ?Monitor CBC ? ?

## 2021-05-22 NOTE — H&P (Signed)
?History and Physical  ? ? ?Patient: Lucas Burke FVC:[id number] DOB: March 02, 1961 ?DOA: 05/22/2021 ?DOS: the patient was seen and examined on 05/22/2021 ?PCP: [facility] [place]  ?Patient coming from: Home ? ?Chief Complaint:  ?Chief Complaint  ?Patient presents with  ? Weakness  ? ? ?HPI: Lucas Burke is a 61 y.o. male with medical history significant for With history of chronic back pain who was brought in by EMS with a 1 week history of generalized weakness, poor oral intake and unintentional weight loss.  On arrival of EMS O2 sat reportedly in the 80s on room air. ?On arrival, tachypneic to 24 with O2 sat 93% on room air, BP initially 149/119, then trending down to 98/69 by admission.  Blood work significant for leukocytosis of 25,000.  Lactic acid not done.  Also had thrombocytosis of 712,000.  Hemoglobin 12, baseline unknown.  Creatinine 1.25 with no recent baseline.  Also noted to have elevated alk phos of 168 and elevated total bilirubin of 1.3.  Troponin 9.  Lipase normal.  EKG, personally viewed and interpreted showed NSR at 96 with nonspecific ST-T wave changes.  Chest x-ray showed the following: ?IMPRESSION: ?Opacification of the lower 1/2 of left hemithorax, likely ?combination of pleural effusion and atelectasis and/or airspace ?disease. ? ?Patient started on Rocephin and azithromycin and given a 1 L fluid bolus.  Hospitalist consulted for admission.  RUQ ultrasound ordered from the ED just prior to admission.  ? ?Review of Systems: As mentioned in the history of present illness. All other systems reviewed and are negative. ?Past Medical History:  ?Diagnosis Date  ? Anxiety   ? Chronic back pain   ? Cigarette smoker   ? Hypokalemia   ? Panic attacks   ? ?Past Surgical History:  ?Procedure Laterality Date  ? COLONOSCOPY  2010  ? ?Social History:  reports that he has been smoking. He has been smoking an average of 2 packs per day. He has never used smokeless tobacco. He reports that he  does not currently use alcohol. No history on file for drug use. ? ?Allergies  ?Allergen Reactions  ? Codeine Other (See Comments)  ?  Critical  ? ? ?No family history on file. ? ?Prior to Admission medications   ?Medication Sig Start Date End Date Taking? Authorizing Provider  ?acetaminophen (TYLENOL) 325 MG tablet Take 650 mg by mouth every 6 (six) hours as needed.    [provider]  ?gabapentin (NEURONTIN) 300 MG capsule Take 300 mg by mouth 2 (two) times daily.    [provider]  ?ibuprofen (ADVIL,MOTRIN) 400 MG tablet Take 800 mg by mouth every 6 (six) hours as needed for pain.    [provider]  ?PARoxetine (PAXIL) 10 MG tablet Take 10 mg by mouth every morning.    [provider]  ? ? ?Physical Exam: ?Vitals:  ? 05/22/21 1842 05/22/21 1843 05/22/21 1845 05/22/21 1934  ?BP: (!) 149/119  (!) 179/74 98/69  ?Pulse: 97  98 88  ?Resp: 16  10 (!) 24  ?Temp: 98 ?F (36.7 ?C)     ?TempSrc: Oral     ?SpO2: 93%  94% 93%  ?Weight:  72.6 kg    ?Height:  6' (1.829 m)    ? ?Physical Exam ?Vitals and nursing note reviewed.  ?Constitutional:   ?   General: He is not in acute distress. ?   Appearance: Normal appearance.  ?   Comments: Frail appearing  ?HENT:  ?  Head: Normocephalic and atraumatic.  ?Cardiovascular:  ?   Rate and Rhythm: Normal rate and regular rhythm.  ?   Pulses: Normal pulses.  ?   Heart sounds: Normal heart sounds. No murmur heard. ?Pulmonary:  ?   Effort: Pulmonary effort is normal.  ?   Breath sounds: Decreased air movement present. Examination of the left-middle field reveals decreased breath sounds. Decreased breath sounds present. No wheezing or rhonchi.  ?Abdominal:  ?   General: Bowel sounds are normal.  ?   Palpations: Abdomen is soft.  ?   Tenderness: There is no abdominal tenderness.  ?Musculoskeletal:     ?   General: No swelling or tenderness. Normal range of motion.  ?   Cervical back: Normal range of motion and neck supple.  ?Skin: ?   General: Skin is  warm and dry.  ?Neurological:  ?   General: No focal deficit present.  ?   Mental Status: He is alert. Mental status is at baseline.  ?   Comments: Fine tremor  ?Psychiatric:     ?   Mood and Affect: Mood normal.     ?   Behavior: Behavior normal.  ? ? ? ?Data Reviewed: ?Relevant notes from primary care and specialist visits, past discharge summaries as available in EHR, including Care Everywhere. ?Prior diagnostic testing as pertinent to current admission diagnoses ?Updated medications and problem lists for reconciliation ?ED course, including vitals, labs, imaging, treatment and response to treatment ?Triage notes, nursing and pharmacy notes and ED provider's notes ?Notable results as noted in HPI ? ? ?Assessment and Plan: ?* Pneumonia ?Pneumonia with some concern for sepsis ?Lethargic, with decreased mentation, hypoxia with EMS, hypotensive after arrival in ED, leukocytosis ?We will get lactic acid to evaluate for sepsis as well as procalcitonin ?Rocephin and azithromycin ?IV hydration for possible sepsis ?As needed DuoNebs, antitussives ?We will keep n.p.o. overnight ?Aspiration precautions ?Consider further evaluation with CT given associated unintentional weight loss ? ?Thrombocytosis ?Secondary to acute illness ?Continue to trend ? ? ?Hyperbilirubinemia ?Elevated bilirubin and alkaline phosphatase ?Follow-up right upper quadrant ultrasound ordered from the ED ? ?Physical debility ?Physical therapy evaluation ? ?Inadequate oral intake ?Management as above under unintentional weight loss ?IV hydration ? ?Unintentional weight loss ?Possibly related to underlying acute illness with some concern for underlying occult process ?Management as above ?Can consider nutritionist eval in the a.m. ? ?Tremor ?chronic ? ?Chronic back pain ?Pain control: Pharmacologic and nonpharmacologic ? ? ? ? ? ? ?Advance Care Planning:   Code Status: Not on file full ? ?Consults: none ? ?Family Communication: son at bedside ? ?Severity  of Illness: ?The appropriate patient status for this patient is INPATIENT. Inpatient status is judged to be reasonable and necessary in order to provide the required intensity of service to ensure the patient's safety. The patient's presenting symptoms, physical exam findings, and initial radiographic and laboratory data in the context of their chronic comorbidities is felt to place them at high risk for further clinical deterioration. Furthermore, it is not anticipated that the patient will be medically stable for discharge from the hospital within 2 midnights of admission.  ? ?* I certify that at the point of admission it is my clinical judgment that the patient will require inpatient hospital care spanning beyond 2 midnights from the point of admission due to high intensity of service, high risk for further deterioration and high frequency of surveillance required.* ? ?Author: ?Athena Masse, MD ?05/22/2021 8:56 PM ? ?For on  call review [email or web address].  ?

## 2021-05-22 NOTE — Assessment & Plan Note (Addendum)
Severe sepsis -present on admission with leukocytosis, hypotension, altered mental status consistent with organ dysfunction, hypoxia with EMS. ?Started on Rocephin and Zithromax on admission.  Transitioned to cefepime due to ongoing fevers.  Now on Unasyn for empyema. ?-- Continue Unasyn ?-- Incentive spirometer ?-- Antipyretics, antitussives mucolytic's, pulmonary hygiene ?-- Supplement O2 if needed to maintain sat above 90% ?

## 2021-05-22 NOTE — Assessment & Plan Note (Addendum)
Elevated bilirubin and alkaline phosphatase ?RUQ ultrasound showed gallbladder filled with sludge, no cholelithiasis or evidence of acute cholecystitis.  No evidence of bile duct obstruction.  Abnormal parenchymal echogenicity of the liver compared to the kidney differential considerations include acute hepatic edema such as due to hepatitis, versus abnormal kidney echogenicity due to chronic medical renal disease. ? ?3/22 T. bili 0.6, resolved ?

## 2021-05-22 NOTE — ED Provider Notes (Signed)
? ?[facility] ?Provider Note ? ? ? Event Date/Time  ? First MD Initiated Contact with Patient 05/22/21 2042   ?  (approximate) ? ? ?History  ? ?Weakness ? ? ?HPI ? ?Lucas Burke is a 61 y.o. male with a history of chronic pain, anxiety who is brought to the ED due to generalized weakness and decreased oral intake for the past week, weight loss.  Patient also reports a cough and dark urine.  EMS reported oxygen saturation of 89% on room air, but it is 95% on room air on arrival to the ED. ? ?Patient denies any focal pain.  No chest pain or abdominal pain, no vomiting or diarrhea.  He states he has loss of appetite. ?  ? ? ?Physical Exam  ? ?Triage Vital Signs: ?ED Triage Vitals  ?Enc Vitals Group  ?   BP 05/22/21 1842 (!) 149/119  ?   Pulse Rate 05/22/21 1842 97  ?   Resp 05/22/21 1842 16  ?   Temp 05/22/21 1842 98 ?F (36.7 ?C)  ?   Temp Source 05/22/21 1842 Oral  ?   SpO2 05/22/21 1842 93 %  ?   Weight 05/22/21 1843 160 lb (72.6 kg)  ?   Height 05/22/21 1843 6' (1.829 m)  ?   Head Circumference --   ?   Peak Flow --   ?   Pain Score 05/22/21 1843 0  ?   Pain Loc --   ?   Pain Edu? --   ?   Excl. in [place]? --   ? ? ?Most recent vital signs: ?Vitals:  ? 05/22/21 1845 05/22/21 1934  ?BP: (!) 179/74 98/69  ?Pulse: 98 88  ?Resp: 10 (!) 24  ?Temp:    ?SpO2: 94% 93%  ? ? ? ?General: Awake, no distress.  ?CV:  Good peripheral perfusion.  Regular rate and rhythm. ?Resp:  Normal effort.  Diminished breath sounds in the left lower lung. ?Abd:  No distention.  Soft and nontender ?Other:  Dry mucous membranes. ? ? ?ED Results / Procedures / Treatments  ? ?Labs ?(all labs ordered are listed, but only abnormal results are displayed) ?Labs Reviewed  ?COMPREHENSIVE METABOLIC PANEL - Abnormal; Notable for the following components:  ?    Result Value  ? Glucose, Bld 110 (*)   ? BUN 41 (*)   ? Creatinine, Ser 1.25 (*)   ? Calcium 12.7 (*)   ? Albumin 2.1 (*)   ? Alkaline Phosphatase 168 (*)   ? Total Bilirubin 1.3  (*)   ? All other components within normal limits  ?CBC WITH DIFFERENTIAL/PLATELET - Abnormal; Notable for the following components:  ? WBC 25.7 (*)   ? Hemoglobin 12.0 (*)   ? HCT 37.6 (*)   ? MCH 25.6 (*)   ? Platelets 712 (*)   ? Neutro Abs 21.8 (*)   ? Monocytes Absolute 2.6 (*)   ? Abs Immature Granulocytes 0.22 (*)   ? All other components within normal limits  ?RESP PANEL BY RT-PCR (FLU A&B, COVID) ARPGX2  ?LIPASE, BLOOD  ?URINALYSIS, ROUTINE W REFLEX MICROSCOPIC  ?ETHANOL  ?TROPONIN I (HIGH SENSITIVITY)  ?TROPONIN I (HIGH SENSITIVITY)  ? ? ? ?EKG ? ?Interpreted by me ?Sinus rhythm rate of 98.  Left axis, normal intervals.  Normal QRS ST segments and T waves.  No ischemic changes. ? ? ?RADIOLOGY ?Chest x-ray viewed and interpreted by me, shows opacification of the left lower lung.  Radiology report reviewed. ? ? ? ?PROCEDURES: ? ?Critical Care performed: No ? ?.1-3 Lead EKG Interpretation ?Performed by: Carrie Mew, MD ?Authorized by: Carrie Mew, MD  ? ?  Interpretation: normal   ?  ECG rate:  90 ?  ECG rate assessment: normal   ?  Rhythm: sinus rhythm   ?  Ectopy: none   ?  Conduction: normal   ? ? ?MEDICATIONS ORDERED IN ED: ?Medications  ?cefTRIAXone (ROCEPHIN) 2 g in sodium chloride 0.9 % 100 mL IVPB (has no administration in time range)  ?azithromycin (ZITHROMAX) 500 mg in sodium chloride 0.9 % 250 mL IVPB (has no administration in time range)  ?lactated ringers bolus 1,000 mL (1,000 mLs Intravenous New Bag/Given 05/22/21 1930)  ? ? ? ?IMPRESSION / MDM / ASSESSMENT AND PLAN / ED COURSE  ?I reviewed the triage vital signs and the nursing notes. ?             ?               ? ?Differential diagnosis includes, but is not limited to, pneumonia, pleural effusion, pneumothorax, tumor, UTI, electrolyte abnormality, dehydration, COVID ? ?**The patient is on the cardiac monitor to evaluate for evidence of arrhythmia and/or significant heart rate changes.**} ? ?Patient presents with generalized  weakness, decreased oral intake, abnormal lung exam.  X-ray shows opacification of the left lower lung consistent with pneumonia versus pleural effusion, possibility of underlying mass given the weight loss.  Additionally, lab abnormalities could relate to malignancy versus biliary disease.  By my assessment he is not septic.  ? ? Will obtain ultrasound right upper quadrant.  With his abrupt weight loss and functional decline, he will need to be hospitalized for further management of this acute illness.  Case discussed with hospitalist. ? ?  ? ? ?FINAL CLINICAL IMPRESSION(S) / ED DIAGNOSES  ? ?Final diagnoses:  ?Community acquired pneumonia of left lower lobe of lung  ? ? ? ?Rx / DC Orders  ? ?ED Discharge Orders   ? ? None  ? ?  ? ? ? ?Note:  This document was prepared using Dragon voice recognition software and may include unintentional dictation errors. ?  ?Carrie Mew, MD ?05/22/21 2156 ? ?

## 2021-05-23 ENCOUNTER — Inpatient Hospital Stay: Payer: Medicaid Other

## 2021-05-23 ENCOUNTER — Encounter: Payer: Self-pay | Admitting: Internal Medicine

## 2021-05-23 DIAGNOSIS — R638 Other symptoms and signs concerning food and fluid intake: Secondary | ICD-10-CM

## 2021-05-23 DIAGNOSIS — R251 Tremor, unspecified: Secondary | ICD-10-CM

## 2021-05-23 DIAGNOSIS — D75839 Thrombocytosis, unspecified: Secondary | ICD-10-CM

## 2021-05-23 DIAGNOSIS — J189 Pneumonia, unspecified organism: Secondary | ICD-10-CM | POA: Diagnosis not present

## 2021-05-23 LAB — BASIC METABOLIC PANEL
Anion gap: 5 (ref 5–15)
BUN: 40 mg/dL — ABNORMAL HIGH (ref 8–23)
CO2: 27 mmol/L (ref 22–32)
Calcium: 11.9 mg/dL — ABNORMAL HIGH (ref 8.9–10.3)
Chloride: 101 mmol/L (ref 98–111)
Creatinine, Ser: 1.13 mg/dL (ref 0.61–1.24)
GFR, Estimated: >60 mL/min (ref >60)
Glucose, Bld: 106 mg/dL — ABNORMAL HIGH (ref 70–99)
Potassium: 4.3 mmol/L (ref 3.5–5.1)
Sodium: 133 mmol/L — ABNORMAL LOW (ref 135–145)

## 2021-05-23 LAB — CBC
HCT: 33.6 % — ABNORMAL LOW (ref 39.0–52.0)
Hemoglobin: 10.6 g/dL — ABNORMAL LOW (ref 13.0–17.0)
MCH: 25.7 pg — ABNORMAL LOW (ref 26.0–34.0)
MCHC: 31.5 g/dL (ref 30.0–36.0)
MCV: 81.6 fL (ref 80.0–100.0)
Platelets: 605 10*3/uL — ABNORMAL HIGH (ref 150–400)
RBC: 4.12 MIL/uL — ABNORMAL LOW (ref 4.22–5.81)
RDW: 13.1 % (ref 11.5–15.5)
WBC: 21 10*3/uL — ABNORMAL HIGH (ref 4.0–10.5)
nRBC: 0 % (ref 0.0–0.2)

## 2021-05-23 LAB — LACTIC ACID, PLASMA
Lactic Acid, Venous: 1.2 mmol/L (ref 0.5–1.9)
Lactic Acid, Venous: 1.3 mmol/L (ref 0.5–1.9)

## 2021-05-23 LAB — ETHANOL: Alcohol, Ethyl (B): <10 mg/dL (ref <10)

## 2021-05-23 LAB — TROPONIN I (HIGH SENSITIVITY): Troponin I (High Sensitivity): 6 ng/L (ref <18)

## 2021-05-23 LAB — HIV ANTIBODY (ROUTINE TESTING W REFLEX): HIV Screen 4th Generation wRfx: NONREACTIVE

## 2021-05-23 NOTE — TOC Initial Note (Signed)
Transition of Care (TOC) - Initial/Assessment Note  ? ? ?Patient Details  ?Name: Lucas Burke ?MRN: [id number] ?Date of Birth: 21-Aug-1960 ? ?Transition of Care (TOC) CM/SW Contact:    ?Jashayla Glatfelter E Jocob Dambach, LCSW ?Phone Number: ?05/23/2021, 3:38 PM ? ?Clinical Narrative:                Spoke with daughter [name] who is at bedside. ?[place] reported patient lives with his 3 children (ages 82- [place], 25, and 5). ?PCP is [facility]. Pharmacy is [facility]. No DME, HH, or SNF history.  ?Patient typically drives himself places at baseline.  ?Patient and daughter are agreeable to HHPT and RW recs. No agency preferences. Confirmed home address. Patient prefers afternoon [facility] appointments if possible. Will need to call [facility] (listed in chart) for scheduling.  ?[place] referral made to [facility] with [facility]. RW referral made to [facility] with [facility] who requested TOC call when DC date is confirmed. ? ? ?Expected Discharge Plan: [place] ?Barriers to Discharge: Continued Medical Work up ? ? ?Patient Goals and CMS Choice ?Patient states their goals for this hospitalization and ongoing recovery are:: home with home health ?CMS Medicare.gov Compare Post Acute Care list provided to:: Patient Represenative (must comment) ?Choice offered to / list presented to : Adult Children ? ?Expected Discharge Plan and Services ?Expected Discharge Plan: [place] ?  ?  ?  ?Living arrangements for the past 2 months: [place] ?                ?DME Arranged: [place] ?DME Agency: [facility] ?Date DME Agency Contacted: 05/23/21 ?  ?Representative spoke with at DME Agency: Delana Meyer ?HH Arranged: PT ?[place] Agency: [place] [facility]) ?Date HH Agency Contacted: 05/23/21 ?  ?Representative spoke with at [place]: Corene Cornea ? ?Prior Living Arrangements/Services ?Living arrangements for the past 2 months: [place] ?Lives with:: Minor Children ?Patient language and need for interpreter  reviewed:: Yes ?Do you feel safe going back to the place where you live?: Yes      ?Need for Family Participation in Patient Care: Yes (Comment) ?Care giver support system in place?: Yes (comment) ?  ?Criminal Activity/Legal Involvement Pertinent to Current Situation/Hospitalization: No - Comment as needed ? ?Activities of Daily Living ?Home Assistive Devices/Equipment: None ?ADL Screening (condition at time of admission) ?Patient's cognitive ability adequate to safely complete daily activities?: Yes ?Is the patient deaf or have difficulty hearing?: No ?Does the patient have difficulty seeing, even when wearing glasses/contacts?: Yes ?Does the patient have difficulty concentrating, remembering, or making decisions?: No ?Patient able to express need for assistance with ADLs?: Yes ?Does the patient have difficulty dressing or bathing?: Yes ?Independently performs ADLs?: No ?Communication: Independent ?Dressing (OT): Needs assistance ?Is this a change from baseline?: Change from baseline, expected to last >3 days ?Grooming: Needs assistance ?Is this a change from baseline?: Change from baseline, expected to last >3 days ?Feeding: Independent ?Bathing: Needs assistance ?Is this a change from baseline?: Change from baseline, expected to last >3 days ?In/Out Bed: Needs assistance ?Is this a change from baseline?: Change from baseline, expected to last >3 days ?Walks in Home: Needs assistance ?Is this a change from baseline?: Change from baseline, expected to last >3 days ?Does the patient have difficulty walking or climbing stairs?: Yes ?Weakness of Legs: Both ?Weakness of Arms/Hands: Both ? ?Permission Sought/Granted ?Permission sought to share information with : Customer service manager ?Permission granted to share information with :  Yes, Verbal Permission Granted ?   ? Permission granted to share info w AGENCY: [place] and DME agencies ?   ?   ? ?Emotional Assessment ?  ?  ?  ?  ?Alcohol / Substance Use: Not  Applicable ?Psych Involvement: No (comment) ? ?Admission diagnosis:  Pneumonia [J18.9] ?Community acquired pneumonia of left lower lobe of lung [J18.9] ?Patient Active Problem List  ? Diagnosis Date Noted  ? Tremor 05/23/2021  ? Pneumonia 05/22/2021  ? Chronic back pain   ? Unintentional weight loss   ? Inadequate oral intake   ? Physical debility   ? Hyperbilirubinemia   ? Thrombocytosis   ? Marijuana use 05/28/2019  ? Chronic pain syndrome 05/23/2019  ? Pharmacologic therapy 05/23/2019  ? Disorder of skeletal system 05/23/2019  ? Problems influencing health status 05/23/2019  ? Long term prescription benzodiazepine use (Alprazolam) 05/23/2019  ? Abnormal CT scan, cervical spine (01/30/2019) 05/23/2019  ? DDD (degenerative disc disease), cervical 05/23/2019  ? Cervical facet arthropathy (Multilevel) (Bilateral) 05/23/2019  ? 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 Abnormal myelogram of cervical spine (2014) 05/23/2019  ? Cervical Grade 1 Anterolisthesis of C3/C4 05/23/2019  ? Abnormal CT myelogram of lumbar spine (2014) 05/23/2019  ? Chronic low back pain (2ry area of Pain) (Bilateral) (L>R) 05/23/2019  ? Chronic lower extremity pain (1ry area of Pain) (Bilateral) (L>R) 05/23/2019  ? Chronic lumbar radiculitis (Left) (L4) 05/23/2019  ? Chronic neck pain (3ry area of Pain) (Left) 05/23/2019  ? Cervicalgia 05/23/2019  ? Chronic thoracic back pain (Midline) 05/23/2019  ? Chronic hip pain (Left) 05/23/2019  ? Chronic groin pain (Left) 05/23/2019  ? ?PCP:  [facility] [place] ?Pharmacy:   ?[facility] [place], [place] AT [facility] OF [address] ?[place] ?[place] [address] ?Phone: [phone] Fax: [phone] ? ? ? ? ?Social Determinants of Health (SDOH) Interventions ?  ? ?Readmission Risk Interventions ?Readmission Risk Prevention Plan 05/23/2021  ?Post Dischage Appt Complete  ?Medication Screening Complete  ?Transportation Screening Complete  ?Some  recent data might be hidden  ? ? ? ?

## 2021-05-23 NOTE — Evaluation (Addendum)
Clinical/Bedside Swallow Evaluation ?Patient Details  ?Name: Lucas Burke ?MRN: [id number] ?Date of Birth: [birth year] ? ?Today's Date: 05/23/2021 ?Time: SLP Start Time (ACUTE ONLY): 3532 SLP Stop Time (ACUTE ONLY): 1155 ?SLP Time Calculation (min) (ACUTE ONLY): 25 min ? ?Past Medical History:  ?Past Medical History:  ?Diagnosis Date  ? Anxiety   ? Chronic back pain   ? Cigarette smoker   ? Hypokalemia   ? Panic attacks   ? ?Past Surgical History:  ?Past Surgical History:  ?Procedure Laterality Date  ? COLONOSCOPY  2010  ? ?HPI:  ?Per H&P "Lucas Burke is a 61 y.o. male with medical history significant for With history of chronic back pain who was brought in by EMS with a 1 week history of generalized weakness, poor oral intake and unintentional weight loss.  On arrival of EMS O2 sat reportedly in the 80s on room air.  On arrival, tachypneic to 24 with O2 sat 93% on room air, BP initially 149/119, then trending down to 98/69 by admission.  Blood work significant for leukocytosis of 25,000.  Lactic acid not done.  Also had thrombocytosis of 712,000.  Hemoglobin 12, baseline unknown.  Creatinine 1.25 with no recent baseline.  Also noted to have elevated alk phos of 168 and elevated total bilirubin of 1.3.  Troponin 9.  Lipase normal.  EKG, personally viewed and interpreted showed NSR at 96 with nonspecific ST-T wave changes.  Chest x-ray showed the following:  IMPRESSION:  Opacification of the lower 1/2 of left hemithorax, likely  combination of pleural effusion and atelectasis and/or airspace  disease.     Patient started on Rocephin and azithromycin and given a 1 L fluid bolus.  Hospitalist consulted for admission.  RUQ ultrasound ordered from the ED just prior to admission."  ?  ?Assessment / Plan / Recommendation  ?Clinical Impression ? Pt seen for clinical swallowing evaluation. Pt alert and cooperative. ?mild confusion noted. Daughter at bedside. Pt and daughter deny pt with hx of dysphagia. Pt with baseline,  congested cough throughout evaluation.  ? ?Oral motor exam significant for edentulism and lingual weakness bilaterally.  ? ?Per chart review, temp WNL and WBC trending downward. CXR on admission "Opacification of the lower 1/2 of left hemithorax, likely combination of pleural effusion and atelectasis and/or airspace disease." ? ?Pt given trials of thin liquids (via cup, straw), pureed, and solid. Pt  demonstrated s/sx mild oral dysphagia c/b prolonged mastication of solids likely due to edentulism and lingual weakness. Pharyngeal swallow appeared [facility] per clinical assessment. No overt s/sx pharyngeal dysphagia. To palpation, pt with seemingly timely swallow initiation and seemingly adequate laryngeal elevation. Pt required assistance with feeding due to BUE tremors. Noted congestive cough noted; however, did not appear related to pharyngeal swallow function. ? ?Recommend initiation of a mech soft diet with thin liquids and safe swallowing strategies/aspiration precautions as outlined below.  ? ?SLP to f/u per POC for diet tolerance and trials of upgraded textures as appropriate.  ? ?Pt, pt's daughter, RN, and MD made aware of diet recommendations, safe swallowing precautions/aspiration precautions, and SLP POC. Pt and pt's daughter verbalized understanding/agreement. ? ?SLP Visit Diagnosis: Dysphagia, oral phase (R13.11) ?   ?Aspiration Risk ?    ?  ?Diet Recommendation Dysphagia 3 (Mech soft);Thin liquid  ? ?Medication Administration:  (as tolerated) ?Supervision: Staff to assist with self feeding (set up and assistance PRN) ?Compensations: Minimize environmental distractions;Slow rate;Small sips/bites ?Postural Changes: Seated upright at 90 degrees;Remain upright for at least 30  minutes after po intake  ?  ?Other  Recommendations Oral Care Recommendations: Oral care QID;Staff/trained caregiver to provide oral care (set up)   ? ?Recommendations for follow up therapy are one component of a multi-disciplinary  discharge planning process, led by the attending physician.  Recommendations may be updated based on patient status, additional functional criteria and insurance authorization. ? ?Follow up Recommendations No SLP follow up  ? ? ?  ?Assistance Recommended at Discharge Intermittent Supervision/Assistance  ?Functional Status Assessment Patient has had a recent decline in their functional status and demonstrates the ability to make significant improvements in function in a reasonable and predictable amount of time.  ?Frequency and Duration min 2x/week  ?2 weeks ?  ?   ? ?Prognosis Prognosis for Safe Diet Advancement: Fair ?Barriers to Reach Goals: Severity of deficits  ? ?  ? ?Swallow Study   ?General Date of Onset: 05/22/21 ?HPI: Per H&P "Lucas Burke is a 61 y.o. male with medical history significant for With history of chronic back pain who was brought in by EMS with a 1 week history of generalized weakness, poor oral intake and unintentional weight loss.  On arrival of EMS O2 sat reportedly in the 80s on room air.  On arrival, tachypneic to 24 with O2 sat 93% on room air, BP initially 149/119, then trending down to 98/69 by admission.  Blood work significant for leukocytosis of 25,000.  Lactic acid not done.  Also had thrombocytosis of 712,000.  Hemoglobin 12, baseline unknown.  Creatinine 1.25 with no recent baseline.  Also noted to have elevated alk phos of 168 and elevated total bilirubin of 1.3.  Troponin 9.  Lipase normal.  EKG, personally viewed and interpreted showed NSR at 96 with nonspecific ST-T wave changes.  Chest x-ray showed the following:  IMPRESSION:  Opacification of the lower 1/2 of left hemithorax, likely  combination of pleural effusion and atelectasis and/or airspace  disease.     Patient started on Rocephin and azithromycin and given a 1 L fluid bolus.  Hospitalist consulted for admission.  RUQ ultrasound ordered from the ED just prior to admission." ?Type of Study: Bedside Swallow  Evaluation ?Previous Swallow Assessment: unknown ?Diet Prior to this Study: NPO ?Temperature Spikes Noted: No ?Respiratory Status: Room air ?History of Recent Intubation: No ?Behavior/Cognition: Alert;Pleasant mood;Requires cueing ?Oral Cavity Assessment: Within Functional Limits ?Oral Care Completed by SLP: Recent completion by staff ?Oral Cavity - Dentition: Edentulous ?Vision: Functional for self-feeding ?Self-Feeding Abilities: Needs assist;Needs set up ?Patient Positioning: Upright in bed ?Baseline Vocal Quality: Normal ?Volitional Cough: Strong;Congested ?Volitional Swallow: Able to elicit  ?  ?Oral/Motor/Sensory Function Overall Oral Motor/Sensory Function: Mild impairment ?Lingual Strength: Reduced   ?Thin Liquid Thin Liquid: Within functional limits ?Presentation: Cup;Straw;Self Fed  ?  ?Puree Puree: Within functional limits ?Presentation: Self Fed   ?Solid ? ? ?  Solid: Impaired ?Oral Phase Impairments: Impaired mastication ?Oral Phase Functional Implications: Impaired mastication  ? ?  ?Cherrie Gauze, M.S., CCC-SLP ?Speech-Language Pathologist ?[facility] ?[phone] ([place])  ? ?Quintella Baton ?05/23/2021,2:45 PM ? ? ? ?

## 2021-05-23 NOTE — Progress Notes (Signed)
At 0443, updated Calla Kicks about  BP of 89/60, no new order made, will continue to monitor patient. ?

## 2021-05-23 NOTE — Plan of Care (Signed)

## 2021-05-23 NOTE — Progress Notes (Addendum)
Progress Note    Lucas Burke  ZOX:[id number] DOB: [birth year]  DOA: 05/22/2021 PCP: [facility]      Brief Narrative:    Medical records reviewed and are as summarized below:  Lucas Burke is a 61 y.o. male with medical history significant for chronic back pain panic attacks, anxiety, tobacco use disorder, tremors, who presented to the hospital because of generalized weakness, poor oral intake and unintentional weight loss.  Reportedly, when EMS arrived, he was hypoxic with oxygen saturation of 80% on room air.  He was admitted to the hospital for sepsis secondary to pneumonia complicated by acute hypoxic respiratory failure.  He was treated with empiric IV antibiotics, IV fluids and oxygen via nasal cannula.        Assessment/Plan:   Principal Problem:   Pneumonia Active Problems:   Hyperbilirubinemia   Thrombocytosis   Unintentional weight loss   Inadequate oral intake   Physical debility   Chronic back pain   Tremor    Body mass index is 21.35 kg/m.   Sepsis secondary to pneumonia, leukocytosis: Continue empiric IV antibiotics and IV fluids.  Follow-up blood cultures. Suspected dysphagia: Consult to speech therapist for swallow evaluation  Acute hypoxic respiratory failure: Improved.  He is tolerating room air.  Hypotension, dehydration and poor oral intake: Continue IV fluids  Mild hyperbilirubinemia: Right upper quadrant sonogram showed gallbladder filled with sludge.  No evidence of cholelithiasis or cholecystitis.  Thrombocytosis: This is likely reactive.  Monitor CBC  Generalized weakness: PT and OT consulted  Other comorbidities include chronic back pain, tremor, anxiety   Diet Order             DIET DYS 3 Room service appropriate? Yes; Fluid consistency: Thin  Diet effective now                           Consultants: None  Procedures: None    Medications:    enoxaparin (LOVENOX)  injection  40 mg Subcutaneous Q24H   Continuous Infusions:  azithromycin Stopped (05/22/21 2237)   cefTRIAXone (ROCEPHIN)  IV Stopped (05/22/21 2207)   lactated ringers 150 mL/hr at 05/23/21 0547     Anti-infectives (From admission, onward)    Start     Dose/Rate Route Frequency Ordered Stop   05/22/21 2115  cefTRIAXone (ROCEPHIN) 2 g in sodium chloride 0.9 % 100 mL IVPB  Status:  Discontinued        2 g 200 mL/hr over 30 Minutes Intravenous Every 24 hours 05/22/21 2105 05/22/21 2114   05/22/21 2115  azithromycin (ZITHROMAX) 500 mg in sodium chloride 0.9 % 250 mL IVPB  Status:  Discontinued        500 mg 250 mL/hr over 60 Minutes Intravenous Every 24 hours 05/22/21 2105 05/22/21 2114   05/22/21 2030  cefTRIAXone (ROCEPHIN) 2 g in sodium chloride 0.9 % 100 mL IVPB        2 g 200 mL/hr over 30 Minutes Intravenous Every 24 hours 05/22/21 2019 05/27/21 2029   05/22/21 2030  azithromycin (ZITHROMAX) 500 mg in sodium chloride 0.9 % 250 mL IVPB        500 mg 250 mL/hr over 60 Minutes Intravenous Every 24 hours 05/22/21 2019 05/27/21 2029              Family Communication/Anticipated D/C date and plan/Code Status   DVT prophylaxis: enoxaparin (LOVENOX) injection 40 mg Start: 05/22/21 2200  Code Status: Full Code  Family Communication: Plan discussed with Robin Searing, son at the bedside Disposition Plan: Plan to discharge home in 2 to 3 days   Status is: Inpatient Remains inpatient appropriate because: On IV antibiotics and IV fluids       Subjective:   Interval events noted.  He complains of cough productive of yellowish sputum, generalized weakness  Objective:    Vitals:   05/23/21 0436 05/23/21 0443 05/23/21 0550 05/23/21 0841  BP: (!) 86/59 (!) 89/60 (!) 86/54 91/63  Pulse: 66 63 64 78  Resp: 20     Temp: (!) 97.5 F (36.4 C)   98.6 F (37 C)  TempSrc: Oral     SpO2: 99%   92%  Weight:      Height:       No data found.   Intake/Output  Summary (Last 24 hours) at 05/23/2021 1224 Last data filed at 05/23/2021 0502 Gross per 24 hour  Intake 2062.61 ml  Output 300 ml  Net 1762.61 ml   Filed Weights   05/22/21 1843 05/22/21 2308  Weight: 72.6 kg 71.4 kg    Exam:  GEN: NAD SKIN: No rash EYES: EOMI ENT: MMM CV: RRR PULM: CTA B ABD: soft, ND, NT, +BS CNS: AAO x 3, non focal, tremors of upper extremities EXT: No edema or tenderness        Data Reviewed:   I have personally reviewed following labs and imaging studies:  Labs: Labs show the following:   Basic Metabolic Panel: Recent Labs  Lab 05/22/21 1859 05/23/21 0134  NA 135 133*  K 4.5 4.3  CL 100 101  CO2 27 27  GLUCOSE 110* 106*  BUN 41* 40*  CREATININE 1.25* 1.13  CALCIUM 12.7* 11.9*   GFR Estimated Creatinine Clearance: 68.5 mL/min (by C-G formula based on SCr of 1.13 mg/dL). Liver Function Tests: Recent Labs  Lab 05/22/21 1859  AST 21  ALT 18  ALKPHOS 168*  BILITOT 1.3*  PROT 7.3  ALBUMIN 2.1*   Recent Labs  Lab 05/22/21 1859  LIPASE 33   No results for input(s): AMMONIA in the last 168 hours. Coagulation profile No results for input(s): INR, PROTIME in the last 168 hours.  CBC: Recent Labs  Lab 05/22/21 1859 05/23/21 0134  WBC 25.7* 21.0*  NEUTROABS 21.8*  --   HGB 12.0* 10.6*  HCT 37.6* 33.6*  MCV 80.3 81.6  PLT 712* 605*   Cardiac Enzymes: No results for input(s): CKTOTAL, CKMB, CKMBINDEX, TROPONINI in the last 168 hours. BNP (last 3 results) No results for input(s): PROBNP in the last 8760 hours. CBG: No results for input(s): GLUCAP in the last 168 hours. D-Dimer: No results for input(s): DDIMER in the last 72 hours. Hgb A1c: No results for input(s): HGBA1C in the last 72 hours. Lipid Profile: No results for input(s): CHOL, HDL, LDLCALC, TRIG, CHOLHDL, LDLDIRECT in the last 72 hours. Thyroid function studies: No results for input(s): TSH, T4TOTAL, T3FREE, THYROIDAB in the last 72 hours.  Invalid  input(s): FREET3 Anemia work up: No results for input(s): VITAMINB12, FOLATE, FERRITIN, TIBC, IRON, RETICCTPCT in the last 72 hours. Sepsis Labs: Recent Labs  Lab 05/22/21 1859 05/22/21 2352 05/23/21 0134  WBC 25.7*  --  21.0*  LATICACIDVEN  --  1.3 1.2    Microbiology Recent Results (from the past 240 hour(s))  Resp Panel by RT-PCR (Flu A&B, Covid) Nasopharyngeal Swab     Status: None   Collection Time: 05/22/21  6:46  PM   Specimen: Nasopharyngeal Swab; Nasopharyngeal(NP) swabs in vial transport medium  Result Value Ref Range Status   SARS Coronavirus 2 by RT PCR NEGATIVE NEGATIVE Final    Comment: (NOTE) SARS-CoV-2 target nucleic acids are NOT DETECTED.  The SARS-CoV-2 RNA is generally detectable in upper respiratory specimens during the acute phase of infection. The lowest concentration of SARS-CoV-2 viral copies this assay can detect is 138 copies/mL. A negative result does not preclude SARS-Cov-2 infection and should not be used as the sole basis for treatment or other patient management decisions. A negative result may occur with  improper specimen collection/handling, submission of specimen other than nasopharyngeal swab, presence of viral mutation(s) within the areas targeted by this assay, and inadequate number of viral copies(<138 copies/mL). A negative result must be combined with clinical observations, patient history, and epidemiological information. The expected result is Negative.  Fact Sheet for Patients:  BloggerCourse.com  Fact Sheet for Healthcare Providers:  SeriousBroker.it  This test is no t yet approved or cleared by the [place] FDA and  has been authorized for detection and/or diagnosis of SARS-CoV-2 by FDA under an Emergency Use Authorization (EUA). This EUA will remain  in effect (meaning this test can be used) for the duration of the COVID-19 declaration under Section 564(b)(1) of the Act,  21 U.S.C.section 360bbb-3(b)(1), unless the authorization is terminated  or revoked sooner.       Influenza A by PCR NEGATIVE NEGATIVE Final   Influenza B by PCR NEGATIVE NEGATIVE Final    Comment: (NOTE) The Xpert Xpress SARS-CoV-2/FLU/RSV plus assay is intended as an aid in the diagnosis of influenza from Nasopharyngeal swab specimens and should not be used as a sole basis for treatment. Nasal washings and aspirates are unacceptable for Xpert Xpress SARS-CoV-2/FLU/RSV testing.  Fact Sheet for Patients: BloggerCourse.com  Fact Sheet for Healthcare Providers: SeriousBroker.it  This test is not yet approved or cleared by the [place] FDA and has been authorized for detection and/or diagnosis of SARS-CoV-2 by FDA under an Emergency Use Authorization (EUA). This EUA will remain in effect (meaning this test can be used) for the duration of the COVID-19 declaration under Section 564(b)(1) of the Act, 21 U.S.C. section 360bbb-3(b)(1), unless the authorization is terminated or revoked.  Performed at [facility], [address] [place], [place] [address]   CULTURE, BLOOD (ROUTINE X 2) w Reflex to ID Panel     Status: None (Preliminary result)   Collection Time: 05/22/21  6:46 PM   Specimen: BLOOD LEFT HAND  Result Value Ref Range Status   Specimen Description BLOOD LEFT HAND  Final   Special Requests   Final    BOTTLES DRAWN AEROBIC AND ANAEROBIC Blood Culture adequate volume   Culture   Final    NO GROWTH < 12 HOURS Performed at [facility], [address], [place], [place] [address]    Report Status PENDING  Incomplete  CULTURE, BLOOD (ROUTINE X 2) w Reflex to ID Panel     Status: None (Preliminary result)   Collection Time: 05/22/21  6:50 PM   Specimen: Left Antecubital; Blood  Result Value Ref Range Status   Specimen Description LEFT ANTECUBITAL  Final   Special Requests   Final    BOTTLES DRAWN  AEROBIC AND ANAEROBIC Blood Culture adequate volume   Culture   Final    NO GROWTH < 12 HOURS Performed at [facility], [address] [place], [place] [address]    Report  Status PENDING  Incomplete    Procedures and diagnostic studies:  DG Chest Portable 1 View  Result Date: 05/22/2021 CLINICAL DATA:  Weakness. Cough. Weight loss. Shortness of breath. EXAM: PORTABLE CHEST 1 VIEW COMPARISON:  Remote radiograph 12/11/2009 FINDINGS: Opacification of the lower 1/2 of left hemithorax, likely combination of pleural effusion and atelectasis and/or airspace disease. The heart is grossly normal in size, although the left heart border is obscured. No pulmonary edema. The right lung is clear. No visualized pneumothorax, although the lung apices are partially excluded from the field of view. On limited evaluation, no acute osseous abnormalities are seen. IMPRESSION: Opacification of the lower 1/2 of left hemithorax, likely combination of pleural effusion and atelectasis and/or airspace disease. Electronically Signed   By: Narda Rutherford M.D.   On: 05/22/2021 19:17   US ABDOMEN LIMITED RUQ (LIVER/GB)  Result Date: 05/23/2021 CLINICAL DATA:  61 year old male with abnormal LFTs. EXAM: ULTRASOUND ABDOMEN LIMITED RIGHT UPPER QUADRANT COMPARISON:  CT Abdomen and Pelvis 06/28/2009. FINDINGS: Gallbladder: The gallbladder appears filled with sludge (image 3), but the gallbladder wall thickness remains normal. No shadowing echogenic stones. No pericholecystic fluid. No sonographic Murphy sign elicited. Common bile duct: Diameter: 4 mm, normal. Liver: Questionable starry sky appearance of the liver raising the possibility of hypoechoic (edematous) liver parenchyma. No discrete liver lesion. Portal vein is patent on color Doppler imaging with normal direction of blood flow towards the liver. Other: Right renal cortex appears mildly echogenic compared to the liver (image 33). Otherwise negative visible right  kidney. Negative visible pancreas. No right upper quadrant free fluid. IMPRESSION: 1. Gallbladder filled with sludge. No cholelithiasis or evidence of acute cholecystitis. No evidence of bile duct obstruction. 2. Abnormal parenchymal echogenicity of the liver compared to the kidney. Differential considerations include acute hepatic edema such as due to hepatitis, versus abnormal kidney echogenicity due to chronic medical renal disease. Electronically Signed   By: Odessa Fleming M.D.   On: 05/23/2021 05:59               LOS: 1 day   Daevon Holdren  [facility]   Pager on [email or web address]. If 7PM-7AM, please contact night-coverage at [email or web address]     05/23/2021, 12:24 PM

## 2021-05-23 NOTE — Evaluation (Signed)
Physical Therapy Evaluation ?Patient Details ?Name: Lucas Burke ?MRN: [id number] ?DOB: 1960/03/29 ?Today's Date: 05/23/2021 ? ?History of Present Illness ? Lucas Burke is a 61 y.o. male with medical history significant for With history of chronic back pain who was brought in by EMS with a 1 week history of generalized weakness, poor oral intake and unintentional weight loss. ?  ?Clinical Impression ? Pt admitted with above diagnosis. Pt received upright in bed with daughter at bedside. Agreeable to PT services. Pt resting on RA with Spo2 >/= 90% at rest and with mobility. Pt reports at baseline he has resting tremor but currently it is worse and he also is fully indep at baseline with ADL's/IADL's and mobility. Pt requires supervision to transfer with increased time and effort with [facility] elevated tolerating seated balance with SUE support without difficulty. Noted bed saturated in urine so pt assisted in donning clean gown. Pt able to stand with minguard with VC's for hand placement and tolerate standing for ~ 60 sec reporting dizziness thus step transferred to recliner with good sequencing but requiring min VC's for RW proximity. Dizziness improves with sitting. Anticipate orthostatics involved due to positional changes and soft BP from EMR. All needs in reach with family at bedside. Anticipate with ease of bed mobility and transfer, if pt can safely ambulate household distances pt will be safe to d/c home. Pt currently with functional limitations due to the deficits listed below (see PT Problem List). Pt will benefit from skilled PT to increase their independence and safety with mobility to allow discharge to the venue listed below.     ? ?Recommendations for follow up therapy are one component of a multi-disciplinary discharge planning process, led by the attending physician.  Recommendations may be updated based on patient status, additional functional criteria and insurance authorization. ? ?Follow Up  Recommendations Home health PT ? ?  ?Assistance Recommended at Discharge Frequent or constant Supervision/Assistance  ?Patient can return home with the following ? A little help with walking and/or transfers;Assistance with cooking/housework;Assist for transportation;A little help with bathing/dressing/bathroom ? ?  ?Equipment Recommendations Rolling walker (2 wheels)  ?Recommendations for Other Services ?    ?  ?Functional Status Assessment Patient has had a recent decline in their functional status and demonstrates the ability to make significant improvements in function in a reasonable and predictable amount of time.  ? ?  ?Precautions / Restrictions Precautions ?Precautions: Fall ?Restrictions ?Weight Bearing Restrictions: No  ? ?  ? ?Mobility ? Bed Mobility ?Overal bed mobility: Needs Assistance ?Bed Mobility: Supine to Sit ?  ?  ?Supine to sit: Supervision ?  ?  ?General bed mobility comments: use of bed rails ?Patient Response: Cooperative ? ?Transfers ?Overall transfer level: Needs assistance ?Equipment used: Rolling walker (2 wheels) ?Transfers: Sit to/from Stand ?Sit to Stand: Min guard ?  ?  ?  ?  ?  ?General transfer comment: VC's for hand placement ?  ? ?Ambulation/Gait ?  ?  ?  ?  ?  ?  ?  ?General Gait Details: deferred due to dizziness ? ?Stairs ?  ?  ?  ?  ?  ? ?Wheelchair Mobility ?  ? ?Modified Rankin (Stroke Patients Only) ?  ? ?  ? ?Balance Overall balance assessment: Needs assistance ?Sitting-balance support: Single extremity supported, Feet supported ?Sitting balance-Leahy Scale: Fair ?  ?  ?  ?Standing balance-Leahy Scale: Fair ?Standing balance comment: Use of AD for steadying ?  ?  ?  ?  ?  ?  ?  ?  ?  ?  ?  ?   ? ? ? ?  Pertinent Vitals/Pain Pain Assessment ?Pain Assessment: No/denies pain  ? ? ?Home Living Family/patient expects to be discharged to:: Private residence ?Living Arrangements: Children ?Available Help at Discharge: Family;Available PRN/intermittently ?Type of Home: House ?Home  Access: Stairs to enter ?Entrance Stairs-Rails: Can reach both ?Entrance Stairs-Number of Steps: 3-4 ?  ?Home Layout: One level ?  ?Additional Comments: Pt reports he may still have a RW from prior back surgery but unsure. Otherwise no DME.  ?  ?Prior Function Prior Level of Function : Independent/Modified Independent ?  ?  ?  ?  ?  ?  ?  ?  ?  ? ? ?Hand Dominance  ?   ? ?  ?Extremity/Trunk Assessment  ? Upper Extremity Assessment ?Upper Extremity Assessment: Generalized weakness;Defer to OT evaluation ?  ? ?Lower Extremity Assessment ?Lower Extremity Assessment: Generalized weakness ?  ? ?Cervical / Trunk Assessment ?Cervical / Trunk Assessment: Normal  ?Communication  ? Communication: No difficulties  ?Cognition Arousal/Alertness: Awake/alert ?Behavior During Therapy: [facility] for tasks assessed/performed ?Overall Cognitive Status: Within Functional Limits for tasks assessed ?  ?  ?  ?  ?  ?  ?  ?  ?  ?  ?  ?  ?  ?  ?  ?  ?  ?  ?  ? ?  ?General Comments General comments (skin integrity, edema, etc.): SPo2 on RA: >/=90% throughout mobility and rest ? ?  ?Exercises Other Exercises ?Other Exercises: Role of PT in acute setting, D/c recs, use of RW for stability and falls prevention  ? ?Assessment/Plan  ?  ?PT Assessment Patient needs continued PT services  ?PT Problem List Decreased strength;Decreased knowledge of use of DME;Decreased activity tolerance;Decreased safety awareness;Decreased mobility ? ?   ?  ?PT Treatment Interventions DME instruction;Balance training;Gait training;Neuromuscular re-education;Stair training;Functional mobility training;Patient/family education;Therapeutic activities;Therapeutic exercise   ? ?PT Goals (Current goals can be found in the Care Plan section)  ?Acute Rehab PT Goals ?Patient Stated Goal: to get stronger ?PT Goal Formulation: With patient ?Time For Goal Achievement: 06/06/21 ?Potential to Achieve Goals: Good ? ?  ?Frequency Min 2X/week ?  ? ? ?Co-evaluation   ?  ?  ?  ?  ? ? ?   ?AM-PAC PT "6 Clicks" Mobility  ?Outcome Measure Help needed turning from your back to your side while in a flat bed without using bedrails?: A Little ?Help needed moving from lying on your back to sitting on the side of a flat bed without using bedrails?: A Little ?Help needed moving to and from a bed to a chair (including a wheelchair)?: A Little ?Help needed standing up from a chair using your arms (e.g., wheelchair or bedside chair)?: A Little ?Help needed to walk in hospital room?: A Lot ?Help needed climbing 3-5 steps with a railing? : A Lot ?6 Click Score: 16 ? ?  ?End of Session Equipment Utilized During Treatment: Gait belt ?Activity Tolerance: Patient tolerated treatment well ?Patient left: in chair;with family/visitor present ?Nurse Communication: Mobility status ?PT Visit Diagnosis: Unsteadiness on feet (R26.81);Other abnormalities of gait and mobility (R26.89);Muscle weakness (generalized) (M62.81) ?  ? ?Time: 2585-2778 ?PT Time Calculation (min) (ACUTE ONLY): 24 min ? ? ?Charges:   PT Evaluation ?$PT Eval Moderate Complexity: 1 Mod ?PT Treatments ?$Therapeutic Activity: 8-22 mins ?  ?   ? ?Salem Caster. Fairly IV, PT, DPT ?Physical Therapist- [place]  ?[facility]  ?05/23/2021, 12:33 PM ? ?

## 2021-05-23 NOTE — Assessment & Plan Note (Addendum)
chronic, stable ?

## 2021-05-24 DIAGNOSIS — J189 Pneumonia, unspecified organism: Secondary | ICD-10-CM | POA: Diagnosis not present

## 2021-05-24 LAB — BASIC METABOLIC PANEL
Anion gap: 8 (ref 5–15)
BUN: 29 mg/dL — ABNORMAL HIGH (ref 8–23)
CO2: 27 mmol/L (ref 22–32)
Calcium: 11.9 mg/dL — ABNORMAL HIGH (ref 8.9–10.3)
Chloride: 102 mmol/L (ref 98–111)
Creatinine, Ser: 0.99 mg/dL (ref 0.61–1.24)
GFR, Estimated: >60 mL/min (ref >60)
Glucose, Bld: 88 mg/dL (ref 70–99)
Potassium: 4.7 mmol/L (ref 3.5–5.1)
Sodium: 137 mmol/L (ref 135–145)

## 2021-05-24 LAB — CBC WITH DIFFERENTIAL/PLATELET
Abs Immature Granulocytes: 0.11 10*3/uL — ABNORMAL HIGH (ref 0.00–0.07)
Basophils Absolute: 0 10*3/uL (ref 0.0–0.1)
Basophils Relative: 0 %
Eosinophils Absolute: 0.1 10*3/uL (ref 0.0–0.5)
Eosinophils Relative: 0 %
HCT: 38.1 % — ABNORMAL LOW (ref 39.0–52.0)
Hemoglobin: 11.6 g/dL — ABNORMAL LOW (ref 13.0–17.0)
Immature Granulocytes: 1 %
Lymphocytes Relative: 8 %
Lymphs Abs: 1.4 10*3/uL (ref 0.7–4.0)
MCH: 25.4 pg — ABNORMAL LOW (ref 26.0–34.0)
MCHC: 30.4 g/dL (ref 30.0–36.0)
MCV: 83.6 fL (ref 80.0–100.0)
Monocytes Absolute: 1.7 10*3/uL — ABNORMAL HIGH (ref 0.1–1.0)
Monocytes Relative: 10 %
Neutro Abs: 13.9 10*3/uL — ABNORMAL HIGH (ref 1.7–7.7)
Neutrophils Relative %: 81 %
Platelets: 681 10*3/uL — ABNORMAL HIGH (ref 150–400)
RBC: 4.56 MIL/uL (ref 4.22–5.81)
RDW: 13.3 % (ref 11.5–15.5)
WBC: 17.2 10*3/uL — ABNORMAL HIGH (ref 4.0–10.5)
nRBC: 0 % (ref 0.0–0.2)

## 2021-05-24 LAB — MAGNESIUM: Magnesium: 2.1 mg/dL (ref 1.7–2.4)

## 2021-05-24 MED ORDER — GABAPENTIN 300 MG PO CAPS
300.0000 mg | ORAL_CAPSULE | Freq: Two times a day (BID) | ORAL | Status: DC
  Administered 2021-05-24 – 2021-05-30 (×13): 300 mg via ORAL
  Filled 2021-05-24 (×13): qty 1

## 2021-05-24 MED ORDER — ADULT MULTIVITAMIN W/MINERALS CH
1.0000 | ORAL_TABLET | Freq: Every day | ORAL | Status: DC
  Administered 2021-05-24 – 2021-05-30 (×7): 1 via ORAL
  Filled 2021-05-24 (×7): qty 1

## 2021-05-24 MED ORDER — PAROXETINE HCL 20 MG PO TABS
20.0000 mg | ORAL_TABLET | Freq: Every day | ORAL | Status: DC
  Administered 2021-05-24 – 2021-05-30 (×7): 20 mg via ORAL
  Filled 2021-05-24 (×7): qty 1

## 2021-05-24 MED ORDER — ENSURE ENLIVE PO LIQD
237.0000 mL | Freq: Three times a day (TID) | ORAL | Status: DC
  Administered 2021-05-24 – 2021-05-30 (×16): 237 mL via ORAL

## 2021-05-24 NOTE — Progress Notes (Signed)
Initial Nutrition Assessment ? ?DOCUMENTATION CODES:  ? ?Severe malnutrition in context of social or environmental circumstances ? ?INTERVENTION:  ? ?Ensure Enlive po TID, each supplement provides 350 kcal and 20 grams of protein. ? ?Magic cup TID with meals, each supplement provides 290 kcal and 9 grams of protein ? ?MVI po daily  ? ?Pt at high refeed risk; recommend monitor potassium, magnesium and phosphorus labs daily until stable ? ?NUTRITION DIAGNOSIS:  ? ?Severe Malnutrition related to social / environmental circumstances (chronic poor appetite and oral intake) as evidenced by moderate fat depletion, severe fat depletion, moderate muscle depletion, severe muscle depletion, percent weight loss. ? ?GOAL:  ? ?Patient will meet greater than or equal to 90% of their needs ? ?MONITOR:  ? ?PO intake, Supplement acceptance, Labs, I & O's, Skin, Weight trends ? ?REASON FOR ASSESSMENT:  ? ?Malnutrition Screening Tool ?  ? ?ASSESSMENT:  ? 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 ? ?Met with pt in room today. Pt reports decreased appetite and oral intake for several months pta but reports that his oral intake has been poor for weeks. Per chart, pt is down 34lbs(18%) since July; this is significant weight loss. Pt reports his UBW is ~190lbs; pt does have one documented weight from July of 191lbs. Pt with poor oral intake in hospital; pt eating sips and bites of meals. Pt did not touch his lunch tray today. Pt reports that he is willing to drink protein supplements. Pt seen by SLP who recommended mechanical soft diet. RD will add supplements and MVI to help pt meet his estimated needs. Pt is at high refeed risk. Pt noted to have hypercalcemia. MD notified of recent weight loss.  ? ?Medications reviewed and include: lovenox, paxil, azithromycin, ceftriaxone  ? ?Labs reviewed: K 4.7 wnl, BUN 29(H), Ca 11.9(H) adj 13.42(H), Mg 2.1 wnl, alb 2.1(L) ?Wbc- 17.2(H) ? ?NUTRITION -  FOCUSED PHYSICAL EXAM: ? ?Flowsheet Row Most Recent Value  ?Orbital Region Moderate depletion  ?Upper Arm Region Severe depletion  ?Thoracic and Lumbar Region Moderate depletion  ?Buccal Region Moderate depletion  ?Temple Region Moderate depletion  ?Clavicle Bone Region Severe depletion  ?Clavicle and Acromion Bone Region Severe depletion  ?Scapular Bone Region Moderate depletion  ?Dorsal Hand Moderate depletion  ?Patellar Region Severe depletion  ?Anterior Thigh Region Severe depletion  ?Posterior Calf Region Severe depletion  ?Edema (RD Assessment) None  ?Hair Reviewed  ?Eyes Reviewed  ?Mouth Reviewed  ?Skin Reviewed  ?Nails Reviewed  ? ?Diet Order:   ?Diet Order   ? ?       ?  DIET DYS 3 Room service appropriate? Yes; Fluid consistency: Thin  Diet effective now       ?  ? ?  ?  ? ?  ? ?EDUCATION NEEDS:  ? ?Education needs have been addressed ? ?Skin:  Skin Assessment: Reviewed RN Assessment ? ?Last BM:  3/18 ? ?Height:  ? ?Ht Readings from Last 1 Encounters:  ?05/22/21 6' (1.829 m)  ? ? ?Weight:  ? ?Wt Readings from Last 1 Encounters:  ?05/22/21 71.4 kg  ? ? ?Ideal Body Weight:  80.9 kg ? ?BMI:  Body mass index is 21.35 kg/m?. ? ?Estimated Nutritional Needs:  ? ?Kcal:  2100-2400kcal/day ? ?Protein:  105-120g/day ? ?Fluid:  2.2-2.5L/day ? ?Koleen Distance MS, RD, LDN ?Please refer to [facility]ION for RD and/or RD on-call/weekend/after hours pager ? ?

## 2021-05-24 NOTE — Evaluation (Signed)
Occupational Therapy Evaluation ?Patient Details ?Name: Lucas Burke ?MRN: [id number] ?DOB: [birth year] ?Today's Date: 05/24/2021 ? ? ?History of Present Illness DANI DANIS is a 61 y.o. male with medical history significant for With history of chronic back pain who was brought in by EMS with a 1 week history of generalized weakness, poor oral intake and unintentional weight loss.  ? ?Clinical Impression ?  ?Patient presenting with decreased Ind in self care, balance, functional mobility/transfers, endurance, and safety awareness. Patient reports living at home with 3 children ages 26, 22, and 74. Pt reports staying at home with 5 year during the day. Pt does not utilize AD at baseline and is independent with IADLs.  Patient currently needing min A overall without use of RW for functional mobility and toileting needs. Pt fatigues very quickly. BP of 99/66 after returning back to bed from bathroom. Pt reports having older adult children, friends, and neighbors that will and are currently assisting at home. Patient will benefit from acute OT to increase overall independence in the areas of ADLs, functional mobility, and safety awareness in order to safely discharge home with assist. ?   ? ?Recommendations for follow up therapy are one component of a multi-disciplinary discharge planning process, led by the attending physician.  Recommendations may be updated based on patient status, additional functional criteria and insurance authorization.  ? ?Follow Up Recommendations ? Home health OT  ?  ?Assistance Recommended at Discharge Intermittent Supervision/Assistance  ?Patient can return home with the following A little help with walking and/or transfers;A little help with bathing/dressing/bathroom;Assistance with cooking/housework;Help with stairs or ramp for entrance ? ?  ?Functional Status Assessment ? Patient has had a recent decline in their functional status and demonstrates the ability to make significant  improvements in function in a reasonable and predictable amount of time.  ?Equipment Recommendations ? BSC/3in1;Other (comment) (RW)  ?  ?   ?Precautions / Restrictions Precautions ?Precautions: Fall  ? ?  ? ?Mobility Bed Mobility ?Overal bed mobility: Needs Assistance ?Bed Mobility: Supine to Sit ?  ?  ?Supine to sit: Supervision ?  ?  ?General bed mobility comments: use of bed rails ?  ? ?Transfers ?Overall transfer level: Needs assistance ?Equipment used: Rolling walker (2 wheels) ?Transfers: Sit to/from Stand ?Sit to Stand: Min guard, Min assist ?  ?  ?  ?  ?  ?  ?  ? ?  ?Balance Overall balance assessment: Needs assistance ?Sitting-balance support: Single extremity supported, Feet supported ?Sitting balance-Leahy Scale: Good ?  ?  ?Standing balance support: During functional activity ?Standing balance-Leahy Scale: Fair ?Standing balance comment: min A for balance ?  ?  ?  ?  ?  ?  ?  ?  ?  ?  ?  ?   ? ?ADL either performed or assessed with clinical judgement  ? ?ADL Overall ADL's : Needs assistance/impaired ?  ?  ?Grooming: Wash/dry hands;Standing;Min guard ?  ?  ?  ?  ?  ?  ?  ?  ?  ?Toilet Transfer: Minimal assistance ?  ?Toileting- Clothing Manipulation and Hygiene: Minimal assistance;Sit to/from stand ?  ?  ?  ?Functional mobility during ADLs: Minimal assistance ?General ADL Comments: min A for mobility without use of AD  ? ? ? ?Vision Patient Visual Report: No change from baseline ?   ?   ?   ?   ? ?Pertinent Vitals/Pain Pain Assessment ?Pain Assessment: No/denies pain  ? ? ? ?Hand Dominance Right ?  ?  Extremity/Trunk Assessment Upper Extremity Assessment ?Upper Extremity Assessment: Generalized weakness ?  ?Lower Extremity Assessment ?Lower Extremity Assessment: Generalized weakness ?  ?  ?  ?Communication Communication ?Communication: No difficulties ?  ?Cognition Arousal/Alertness: Awake/alert ?Behavior During Therapy: [facility] for tasks assessed/performed ?Overall Cognitive Status: Within Functional Limits  for tasks assessed ?  ?  ?  ?  ?  ?  ?  ?  ?  ?  ?  ?  ?  ?  ?  ?  ?General Comments: pleasant throughout. ?  ?  ?   ?   ?   ? ? ?Home Living Family/patient expects to be discharged to:: Private residence ?Living Arrangements: Children ?Available Help at Discharge: Family;Available PRN/intermittently;Friend(s);Neighbor ?Type of Home: House ?Home Access: Stairs to enter ?Entrance Stairs-Number of Steps: 3-4 ?Entrance Stairs-Rails: Can reach both ?Home Layout: One level ?  ?  ?Bathroom Shower/Tub: Tub/shower unit ?  ?Bathroom Toilet: Standard ?Bathroom Accessibility: Yes ?  ?Home Equipment: None ?  ?  ?  ? ?  ?Prior Functioning/Environment Prior Level of Function : Independent/Modified Independent ?  ?  ?  ?  ?  ?  ?  ?ADLs Comments: Pt reports being independent in all aspects of care and has 3 children at home he cares for ages 19,8, and 5. He is at home each day with the 63 year old. ?  ? ?  ?  ?OT Problem List: Decreased strength;Decreased activity tolerance;Impaired balance (sitting and/or standing);Decreased safety awareness;Decreased knowledge of use of DME or AE;Decreased range of motion ?  ?   ?OT Treatment/Interventions: Self-care/ADL training;Therapeutic exercise;Energy conservation;DME and/or AE instruction;Cognitive remediation/compensation;Therapeutic activities;Balance training;Patient/family education;Modalities;Manual therapy  ?  ?OT Goals(Current goals can be found in the care plan section) Acute Rehab OT Goals ?Patient Stated Goal: to get stronger and go home ?OT Goal Formulation: With patient ?Time For Goal Achievement: 06/07/21 ?Potential to Achieve Goals: Fair ?ADL Goals ?Pt Will Perform Grooming: with modified independence;standing ?Pt Will Perform Lower Body Dressing: with modified independence;sit to/from stand ?Pt Will Transfer to Toilet: with modified independence;ambulating ?Pt Will Perform Toileting - Clothing Manipulation and hygiene: with modified independence;sit to/from stand  ?OT  Frequency: Min 2X/week ?  ? ?   ?AM-PAC OT "6 Clicks" Daily Activity     ?Outcome Measure Help from another person eating meals?: None ?Help from another person taking care of personal grooming?: A Little ?Help from another person toileting, which includes using toliet, bedpan, or urinal?: A Little ?Help from another person bathing (including washing, rinsing, drying)?: A Little ?Help from another person to put on and taking off regular upper body clothing?: None ?Help from another person to put on and taking off regular lower body clothing?: A Little ?6 Click Score: 20 ?  ?End of Session Nurse Communication: Mobility status;Other (comment) (hypotension) ? ?Activity Tolerance: Patient limited by fatigue ?Patient left: in bed;with call bell/phone within reach;with bed alarm set;Other (comment) (MD present) ? ?OT Visit Diagnosis: Unsteadiness on feet (R26.81);Muscle weakness (generalized) (M62.81)  ?              ?Time: 7564-3329 ?OT Time Calculation (min): 17 min ?Charges:  OT General Charges ?$OT Visit: 1 Visit ?OT Evaluation ?$OT Eval Moderate Complexity: 1 Mod ?OT Treatments ?$Self Care/Home Management : 8-22 mins ? ?Darleen Crocker, MS, OTR/L , CBIS ?ascom [phone]  ?05/24/21, 1:25 PM  ?

## 2021-05-24 NOTE — Progress Notes (Signed)
Speech Language Pathology Treatment:    ?Patient Details ?Name: Lucas Burke ?MRN: [id number] ?DOB: [birth year] ?Today's Date: 05/24/2021 ?Time: 3338-3291 ?SLP Time Calculation (min) (ACUTE ONLY): 10 min ? ?Assessment / Plan / Recommendation ?Clinical Impression ? Pt seen for diet tolerance. Pt alert, pleasant, and cooperative. Denied difficulty with breakfast this AM.  ? ?Per chart review, temp WNL and WBC trending downward. Pt on room air. No recent chest imaging.  ? ?Pt observed with soft solid and thin liquids (via straw). Pt demonstrated a grossly intact oral swallow with soft solids c/b mildly prolonged, but functional, mastication which likely due to dental status and lingual weakness. Pharyngeal swallow appeared [facility] per clinical assessment. No overt s/sx pharyngeal dysphagia. To palpation, pt with seemingly timely swallow initiation and seemingly adequate laryngeal elevation. BUE tremors noted; however, pt able to self feed this date. ? ?Recommend initiation of a mech soft diet with thin liquids and safe swallowing strategies/aspiration precautions as outlined below.  ?  ?SLP to f/u per POC for diet tolerance and trials of upgraded textures as appropriate.  ?  ?HPI HPI: Per H&P "Lucas Burke is a 61 y.o. male with medical history significant for With history of chronic back pain who was brought in by EMS with a 1 week history of generalized weakness, poor oral intake and unintentional weight loss.  On arrival of EMS O2 sat reportedly in the 80s on room air.  On arrival, tachypneic to 24 with O2 sat 93% on room air, BP initially 149/119, then trending down to 98/69 by admission.  Blood work significant for leukocytosis of 25,000.  Lactic acid not done.  Also had thrombocytosis of 712,000.  Hemoglobin 12, baseline unknown.  Creatinine 1.25 with no recent baseline.  Also noted to have elevated alk phos of 168 and elevated total bilirubin of 1.3.  Troponin 9.  Lipase normal.  EKG, personally viewed and  interpreted showed NSR at 96 with nonspecific ST-T wave changes.  Chest x-ray showed the following:  IMPRESSION:  Opacification of the lower 1/2 of left hemithorax, likely  combination of pleural effusion and atelectasis and/or airspace  disease.     Patient started on Rocephin and azithromycin and given a 1 L fluid bolus.  Hospitalist consulted for admission.  RUQ ultrasound ordered from the ED just prior to admission." ?  ?   ?SLP Plan ? Continue with current plan of care ? ?  ?  ?Recommendations for follow up therapy are one component of a multi-disciplinary discharge planning process, led by the attending physician.  Recommendations may be updated based on patient status, additional functional criteria and insurance authorization. ?  ? ?Recommendations  ?Diet recommendations: Dysphagia 3 (mechanical soft);Thin liquid ?Medication Administration:  (as tolerated) ?Supervision: Patient able to self feed (set up assistance) ?Compensations: Minimize environmental distractions;Slow rate;Small sips/bites ?Postural Changes and/or Swallow Maneuvers: Out of bed for meals;Seated upright 90 degrees;Upright 30-60 min after meal  ?   ?    ?   ? ? ? ? Oral Care Recommendations: Oral care QID;Staff/trained caregiver to provide oral care (set up assistance) ?Follow Up Recommendations: No SLP follow up ?Assistance recommended at discharge: Intermittent Supervision/Assistance ?SLP Visit Diagnosis: Dysphagia, oral phase (R13.11) ?Plan: Continue with current plan of care ? ? ? ? ?  ?  ? ?Cherrie Gauze, M.S., CCC-SLP ?Speech-Language Pathologist ?[facility] ?[phone])  ? ?Quintella Baton ? ?05/24/2021, 12:57 PM ?

## 2021-05-24 NOTE — Progress Notes (Addendum)
? ? ? ?Progress Note  ? ? ?Lucas Burke  NAT:[id number] DOB: 03-11-1960  DOA: 05/22/2021 ?PCP: [facility]  ? ? ? ? ?Brief Narrative:  ? ? ?Medical records reviewed and are as summarized below: ? ?Lucas Burke is a 61 y.o. male with medical history significant for chronic back pain panic attacks, anxiety, tobacco use disorder, tremors, who presented to the hospital because of generalized weakness, poor oral intake and unintentional weight loss.  Reportedly, when EMS arrived, he was hypoxic with oxygen saturation of 80% on room air. ? ?He was admitted to the hospital for sepsis secondary to pneumonia complicated by acute hypoxic respiratory failure.  He was treated with empiric IV antibiotics, IV fluids and oxygen via nasal cannula. ? ? ? ? ? ? ? ?Assessment/Plan:  ? ?Principal Problem: ?  Pneumonia ?Active Problems: ?  Hyperbilirubinemia ?  Thrombocytosis ?  Unintentional weight loss ?  Inadequate oral intake ?  Physical debility ?  Chronic back pain ?  Tremor ? ? ? ?Body mass index is 21.35 kg/m?. ? ? ?Sepsis secondary to pneumonia, leukocytosis: Leukocytosis is improving.  Continue IV ceftriaxone and azithromycin.  No growth on blood cultures thus far.   ? ?Dysphagia: Speech therapist recommended dysphagia 3 diet.  ? ?Acute hypoxic respiratory failure: Improved.  He is tolerating room air. ? ?Hypotension, dehydration and poor oral intake: Improving.  Discontinue IV fluids.  Orthostatic vital signs as follows: Lying BP 93/51, sitting 111/64 and standing within 1 minute 115/71, standing at 3 minutes 105/73.  No evidence of orthostatic hypotension. ? ?Mild hyperbilirubinemia: Right upper quadrant sonogram showed gallbladder filled with sludge.  No evidence of cholelithiasis or cholecystitis. ? ?Thrombocytosis: This is likely reactive.  Monitor CBC ? ?Generalized weakness: Home health therapy was recommended by OT.  Awaiting PT evaluation. ? ?Other comorbidities include chronic back pain,  tremor, anxiety ? ? ?Diet Order   ? ?       ?  DIET DYS 3 Room service appropriate? Yes; Fluid consistency: Thin  Diet effective now       ?  ? ?  ?  ? ?  ? ? ? ? ? ? ? ? ? ? ?Consultants: ?None ? ?Procedures: ?None ? ? ? ?Medications:  ? ? enoxaparin (LOVENOX) injection  40 mg Subcutaneous Q24H  ? gabapentin  300 mg Oral BID  ? PARoxetine  20 mg Oral Daily  ? ?Continuous Infusions: ? azithromycin Stopped (05/23/21 2138)  ? cefTRIAXone (ROCEPHIN)  IV Stopped (05/23/21 2106)  ? ? ? ?Anti-infectives (From admission, onward)  ? ? Start     Dose/Rate Route Frequency Ordered Stop  ? 05/22/21 2115  cefTRIAXone (ROCEPHIN) 2 g in sodium chloride 0.9 % 100 mL IVPB  Status:  Discontinued       ? 2 g ?200 mL/hr over 30 Minutes Intravenous Every 24 hours 05/22/21 2105 05/22/21 2114  ? 05/22/21 2115  azithromycin (ZITHROMAX) 500 mg in sodium chloride 0.9 % 250 mL IVPB  Status:  Discontinued       ? 500 mg ?250 mL/hr over 60 Minutes Intravenous Every 24 hours 05/22/21 2105 05/22/21 2114  ? 05/22/21 2030  cefTRIAXone (ROCEPHIN) 2 g in sodium chloride 0.9 % 100 mL IVPB       ? 2 g ?200 mL/hr over 30 Minutes Intravenous Every 24 hours 05/22/21 2019 05/27/21 2029  ? 05/22/21 2030  azithromycin (ZITHROMAX) 500 mg in sodium chloride 0.9 % 250 mL IVPB       ?  500 mg ?250 mL/hr over 60 Minutes Intravenous Every 24 hours 05/22/21 2019 05/27/21 2029  ? ?  ? ? ? ? ? ? ? ? ? ?Family Communication/Anticipated D/C date and plan/Code Status  ? ?DVT prophylaxis: enoxaparin (LOVENOX) injection 40 mg Start: 05/22/21 2200 ? ? ?  Code Status: Full Code ? ?Family Communication: None ?Disposition Plan: Plan to discharge home in 1 to 2 days ? ? ?Status is: Inpatient ?Remains inpatient appropriate because: On IV antibiotics  ? ? ? ? ? ? ?Subjective:  ? ?Interval events noted.  He complains of generalized weakness and productive cough.  He feels dizzy on standing.  He feels a little better today. ? ?Objective:  ? ? ?Vitals:  ? 05/23/21 1610 05/23/21 1616  05/23/21 1938 05/24/21 0500  ?BP: (!) 150/59 (!) 96/50 96/60 95/66   ?Pulse: 60  74 68  ?Resp:   16 16  ?Temp: 97.8 ?F (36.6 ?C)  98.4 ?F (36.9 ?C) 98.1 ?F (36.7 ?C)  ?TempSrc:   Oral Oral  ?SpO2: 98%  96% 96%  ?Weight:      ?Height:      ? ?Orthostatic VS for the past 24 hrs: ? BP- Lying Pulse- Lying BP- Sitting Pulse- Sitting BP- Standing at 0 minutes Pulse- Standing at 0 minutes  ?05/24/21 1100 93/51 108 111/64 85 115/71 120  ? ? ? ?Intake/Output Summary (Last 24 hours) at 05/24/2021 1418 ?Last data filed at 05/24/2021 1033 ?Gross per 24 hour  ?Intake 7866.07 ml  ?Output 700 ml  ?Net 7166.07 ml  ? ?Filed Weights  ? 05/22/21 1843 05/22/21 2308  ?Weight: 72.6 kg 71.4 kg  ? ? ?Exam: ? ?GEN: NAD ?SKIN: No rash ?EYES: EOMI ?ENT: MMM ?CV: RRR ?PULM: Decreased air entry bilaterally, no wheezing or rales heard ?ABD: soft, ND, NT, +BS ?CNS: AAO x 3, non focal ?EXT: No edema or tenderness ? ? ? ?  ? ? ?Data Reviewed:  ? ?I have personally reviewed following labs and imaging studies: ? ?Labs: ?Labs show the following:  ? ?Basic Metabolic Panel: ?Recent Labs  ?Lab 05/22/21 ?1859 05/23/21 ?0134 05/24/21 ?9518  ?NA 135 133* 137  ?K 4.5 4.3 4.7  ?CL 100 101 102  ?CO2 27 27 27   ?GLUCOSE 110* 106* 88  ?BUN 41* 40* 29*  ?CREATININE 1.25* 1.13 0.99  ?CALCIUM 12.7* 11.9* 11.9*  ?MG  --   --  2.1  ? ?GFR ?Estimated Creatinine Clearance: 78.1 mL/min (by C-G formula based on SCr of 0.99 mg/dL). ?Liver Function Tests: ?Recent Labs  ?Lab 05/22/21 ?1859  ?AST 21  ?ALT 18  ?ALKPHOS 168*  ?BILITOT 1.3*  ?PROT 7.3  ?ALBUMIN 2.1*  ? ?Recent Labs  ?Lab 05/22/21 ?1859  ?LIPASE 33  ? ?No results for input(s): AMMONIA in the last 168 hours. ?Coagulation profile ?No results for input(s): INR, PROTIME in the last 168 hours. ? ?CBC: ?Recent Labs  ?Lab 05/22/21 ?1859 05/23/21 ?0134 05/24/21 ?8416  ?WBC 25.7* 21.0* 17.2*  ?NEUTROABS 21.8*  --  13.9*  ?HGB 12.0* 10.6* 11.6*  ?HCT 37.6* 33.6* 38.1*  ?MCV 80.3 81.6 83.6  ?PLT 712* 605* 681*  ? ?Cardiac  Enzymes: ?No results for input(s): CKTOTAL, CKMB, CKMBINDEX, TROPONINI in the last 168 hours. ?BNP (last 3 results) ?No results for input(s): PROBNP in the last 8760 hours. ?CBG: ?No results for input(s): GLUCAP in the last 168 hours. ?D-Dimer: ?No results for input(s): DDIMER in the last 72 hours. ?Hgb A1c: ?No results for input(s): HGBA1C in the last  72 hours. ?Lipid Profile: ?No results for input(s): CHOL, HDL, LDLCALC, TRIG, CHOLHDL, LDLDIRECT in the last 72 hours. ?Thyroid function studies: ?No results for input(s): TSH, T4TOTAL, T3FREE, THYROIDAB in the last 72 hours. ? ?Invalid input(s): FREET3 ?Anemia work up: ?No results for input(s): VITAMINB12, FOLATE, FERRITIN, TIBC, IRON, RETICCTPCT in the last 72 hours. ?Sepsis Labs: ?Recent Labs  ?Lab 05/22/21 ?1859 05/22/21 ?2352 05/23/21 ?0134 05/24/21 ?7737  ?WBC 25.7*  --  21.0* 17.2*  ?LATICACIDVEN  --  1.3 1.2  --   ? ? ?Microbiology ?Recent Results (from the past 240 hour(s))  ?Resp Panel by RT-PCR (Flu A&B, Covid) Nasopharyngeal Swab     Status: None  ? Collection Time: 05/22/21  6:46 PM  ? Specimen: Nasopharyngeal Swab; Nasopharyngeal(NP) swabs in vial transport medium  ?Result Value Ref Range Status  ? SARS Coronavirus 2 by RT PCR NEGATIVE NEGATIVE Final  ?  Comment: (NOTE) ?SARS-CoV-2 target nucleic acids are NOT DETECTED. ? ?The SARS-CoV-2 RNA is generally detectable in upper respiratory ?specimens during the acute phase of infection. The lowest ?concentration of SARS-CoV-2 viral copies this assay can detect is ?138 copies/mL. A negative result does not preclude SARS-Cov-2 ?infection and should not be used as the sole basis for treatment or ?other patient management decisions. A negative result may occur with  ?improper specimen collection/handling, submission of specimen other ?than nasopharyngeal swab, presence of viral mutation(s) within the ?areas targeted by this assay, and inadequate number of viral ?copies(<138 copies/mL). A negative result must be  combined with ?clinical observations, patient history, and epidemiological ?information. The expected result is Negative. ? ?Fact Sheet for Patients:  ?Ent[employer]ur[employer]ulse.com.au ? ?Fact Sh

## 2021-05-24 NOTE — Progress Notes (Signed)
PT Cancellation Note ? ?Patient Details ?Name: Lucas Burke ?MRN: [id number] ?DOB: 09-Jul-1960 ? ? ?Cancelled Treatment:    Reason Eval/Treat Not Completed: Patient declined, no reason specified. Pt received upright in bed eating lunch with visitor present. Pt politely declining PT to eat and also reporting fatigue as he has worked with OT and mobility specialist. Will re-attempt at later time/date per POC. ? ? ?Salem Caster. Fairly IV, PT, DPT ?Physical Therapist- [place]  ?[facility]  ?05/24/2021, 2:03 PM ?

## 2021-05-24 NOTE — Progress Notes (Signed)
Mobility Specialist - Progress Note ? ? 05/24/21 1100  ?Orthostatic Lying   ?BP- Lying 93/51  ?Pulse- Lying 108  ?Orthostatic Sitting  ?BP- Sitting 111/64  ?Pulse- Sitting 85  ?Orthostatic Standing at 0 minutes  ?BP- Standing at 0 minutes 115/71  ?Pulse- Standing at 0 minutes 120  ?Orthostatic Standing at 3 minutes  ?BP- Standing at 3 minutes 105/73  ?Mobility  ?Activity Ambulated with assistance to bathroom;Stood at bedside;Dangled on edge of bed  ?Level of Assistance Standby assist, set-up cues, supervision of patient - no hands on  ?Assistive Device Front wheel walker  ?Distance Ambulated (ft) 15 ft  ?Activity Response Tolerated well  ?$Mobility charge 1 Mobility  ? ? ? ?Pt lying in bed upon arrival, utilizing RA. Still with tremors throughout session. Pt able to achieve EOB without assist. Denied dizziness while taking orthostatics. Pt performed STS x3 during session, minA on first attempt then progressing to supervision. Pt ambulated to bathroom for urinal output then performed hand hygiene at sink with supervision, does report mild dizziness during ambulation but no LOB. Pt returned supine with alarm set, needs in reach. BP 113/53 post-session. RN notified. ? ? ?Kathee Delton ?Mobility Specialist ?05/24/21, 11:29 AM ? ? ? ? ?

## 2021-05-24 NOTE — TOC Progression Note (Signed)
Transition of Care (TOC) - Progression Note  ? ? ?Patient Details  ?Name: Lucas Burke ?MRN: [id number] ?Date of Birth: [birth year] ? ?Transition of Care (TOC) CM/SW Contact  ?Beverly Sessions, RN ?Phone Number: ?05/24/2021, 2:38 PM ? ?Clinical Narrative:    ? ?Per MD anticipated DC tomorrow.  ?Danielle with [employer] notified to deliver RW  ? ?Expected Discharge Plan: [place] ?Barriers to Discharge: Continued Medical Work up ? ?Expected Discharge Plan and Services ?Expected Discharge Plan: [place] ?  ?  ?  ?Living arrangements for the past 2 months: [place] ?                ?DME Arranged: Walker rolling ?DME Agency: [employer] ?Date DME Agency Contacted: 05/23/21 ?  ?Representative spoke with at DME Agency: Delana Meyer ?HH Arranged: PT ?Walker Agency: [place] ([place] ?Date HH Agency Contacted: 05/23/21 ?  ?Representative spoke with at [place]: Corene Cornea ? ? ?Social Determinants of Health (SDOH) Interventions ?  ? ?Readmission Risk Interventions ?Readmission Risk Prevention Plan 05/23/2021  ?Post Dischage Appt Complete  ?Medication Screening Complete  ?Transportation Screening Complete  ?Some recent data might be hidden  ? ? ?

## 2021-05-25 DIAGNOSIS — E43 Unspecified severe protein-calorie malnutrition: Secondary | ICD-10-CM

## 2021-05-25 DIAGNOSIS — E46 Unspecified protein-calorie malnutrition: Secondary | ICD-10-CM | POA: Insufficient documentation

## 2021-05-25 LAB — CBC WITH DIFFERENTIAL/PLATELET
Abs Immature Granulocytes: 0.18 10*3/uL — ABNORMAL HIGH (ref 0.00–0.07)
Basophils Absolute: 0 10*3/uL (ref 0.0–0.1)
Basophils Relative: 0 %
Eosinophils Absolute: 0.1 10*3/uL (ref 0.0–0.5)
Eosinophils Relative: 1 %
HCT: 35.8 % — ABNORMAL LOW (ref 39.0–52.0)
Hemoglobin: 11.1 g/dL — ABNORMAL LOW (ref 13.0–17.0)
Immature Granulocytes: 1 %
Lymphocytes Relative: 7 %
Lymphs Abs: 1.2 10*3/uL (ref 0.7–4.0)
MCH: 25.5 pg — ABNORMAL LOW (ref 26.0–34.0)
MCHC: 31 g/dL (ref 30.0–36.0)
MCV: 82.3 fL (ref 80.0–100.0)
Monocytes Absolute: 1.6 10*3/uL — ABNORMAL HIGH (ref 0.1–1.0)
Monocytes Relative: 9 %
Neutro Abs: 14.8 10*3/uL — ABNORMAL HIGH (ref 1.7–7.7)
Neutrophils Relative %: 82 %
Platelets: 729 10*3/uL — ABNORMAL HIGH (ref 150–400)
RBC: 4.35 MIL/uL (ref 4.22–5.81)
RDW: 13.2 % (ref 11.5–15.5)
WBC: 18 10*3/uL — ABNORMAL HIGH (ref 4.0–10.5)
nRBC: 0 % (ref 0.0–0.2)

## 2021-05-25 LAB — IRON AND TIBC
Iron: 14 ug/dL — ABNORMAL LOW (ref 45–182)
Saturation Ratios: 12 % — ABNORMAL LOW (ref 17.9–39.5)
TIBC: 115 ug/dL — ABNORMAL LOW (ref 250–450)
UIBC: 101 ug/dL

## 2021-05-25 LAB — COMPREHENSIVE METABOLIC PANEL
ALT: 19 U/L (ref 0–44)
AST: 21 U/L (ref 15–41)
Albumin: 1.7 g/dL — ABNORMAL LOW (ref 3.5–5.0)
Alkaline Phosphatase: 194 U/L — ABNORMAL HIGH (ref 38–126)
Anion gap: 3 — ABNORMAL LOW (ref 5–15)
BUN: 24 mg/dL — ABNORMAL HIGH (ref 8–23)
CO2: 31 mmol/L (ref 22–32)
Calcium: 11.2 mg/dL — ABNORMAL HIGH (ref 8.9–10.3)
Chloride: 102 mmol/L (ref 98–111)
Creatinine, Ser: 1.03 mg/dL (ref 0.61–1.24)
GFR, Estimated: >60 mL/min (ref >60)
Glucose, Bld: 109 mg/dL — ABNORMAL HIGH (ref 70–99)
Potassium: 4.2 mmol/L (ref 3.5–5.1)
Sodium: 136 mmol/L (ref 135–145)
Total Bilirubin: 0.6 mg/dL (ref 0.3–1.2)
Total Protein: 5.7 g/dL — ABNORMAL LOW (ref 6.5–8.1)

## 2021-05-25 LAB — VITAMIN B12: Vitamin B-12: 756 pg/mL (ref 180–914)

## 2021-05-25 LAB — FERRITIN: Ferritin: 637 ng/mL — ABNORMAL HIGH (ref 24–336)

## 2021-05-25 MED ORDER — FERROUS SULFATE 325 (65 FE) MG PO TABS
325.0000 mg | ORAL_TABLET | ORAL | Status: DC
Start: 2021-05-25 — End: 2021-05-30
  Administered 2021-05-25 – 2021-05-29 (×3): 325 mg via ORAL
  Filled 2021-05-25 (×4): qty 1

## 2021-05-25 MED ORDER — SODIUM CHLORIDE 0.9 % IV SOLN: INTRAVENOUS | Status: AC

## 2021-05-25 NOTE — Progress Notes (Addendum)
? ? ? ?Progress Note  ? ? ?Lucas Burke  WLS:[id number] DOB: October 27, 1960  DOA: 05/22/2021 ?PCP: [facility] [place]  ? ? ? ? ?Brief Narrative:  ? ? ?Medical records reviewed and are as summarized below: ? ?Lucas Burke is a 61 y.o. male with medical history significant for chronic back pain panic attacks, anxiety, tobacco use disorder, tremors, who presented to the hospital because of generalized weakness, poor oral intake and unintentional weight loss.  Reportedly, when EMS arrived, he was hypoxic with oxygen saturation of 80% on room air. ? ?He was admitted to the hospital for sepsis secondary to pneumonia complicated by acute hypoxic respiratory failure.  He was treated with empiric IV antibiotics, IV fluids and oxygen via nasal cannula. ? ? ? ? ? ? ? ?Assessment/Plan:  ? ?Principal Problem: ?  Pneumonia ?Active Problems: ?  Hyperbilirubinemia ?  Thrombocytosis ?  Unintentional weight loss ?  Inadequate oral intake ?  Physical debility ?  Chronic back pain ?  Tremor ?  Protein-calorie malnutrition, severe ? ? ? ?Body mass index is 21.35 kg/m?. ? ? ?Sepsis secondary to pneumonia, leukocytosis, recent fever on 05/24/2021: Leukocytosis is slowly improving.  Continue IV antibiotics (Rocephin and azithromycin).   ? ?Dysphagia: Speech therapist recommended dysphagia 3 diet.  ? ?Acute hypoxic respiratory failure: Improved ? ?Hypotension, dehydration and poor oral intake: Improving.  No evidence of orthostatic hypotension.  Monitor BP closely.  Encouraged adequate oral hydration. ? ?Mild hyperbilirubinemia: Improved. Right upper quadrant sonogram showed gallbladder filled with sludge.  No evidence of cholelithiasis or cholecystitis. ? ?Iron deficiency anemia: Start ferrous sulfate.  Follow-up with PCP recommended for further evaluation. ?Thrombocytosis most likely reactive from iron deficiency anemia. ? ?Generalized weakness: PT and OT recommend home health therapy. ? ?Other comorbidities include  chronic back pain, tremor, anxiety ? ? ?ADDENDUM ? ?Patient had fever again with temperature of 101 ?F at 3:01 PM.  Repeat blood cultures and start low rate IV fluids for hydration. ? ? ?Diet Order   ? ?       ?  DIET DYS 3 Room service appropriate? Yes; Fluid consistency: Thin  Diet effective now       ?  ? ?  ?  ? ?  ? ? ? ? ? ? ? ? ? ? ?Consultants: ?None ? ?Procedures: ?None ? ? ? ?Medications:  ? ? enoxaparin (LOVENOX) injection  40 mg Subcutaneous Q24H  ? feeding supplement  237 mL Oral TID BM  ? ferrous sulfate  325 mg Oral QODAY  ? gabapentin  300 mg Oral BID  ? multivitamin with minerals  1 tablet Oral Daily  ? PARoxetine  20 mg Oral Daily  ? ?Continuous Infusions: ? azithromycin Stopped (05/24/21 2241)  ? cefTRIAXone (ROCEPHIN)  IV Stopped (05/24/21 2041)  ? ? ? ?Anti-infectives (From admission, onward)  ? ? Start     Dose/Rate Route Frequency Ordered Stop  ? 05/22/21 2115  cefTRIAXone (ROCEPHIN) 2 g in sodium chloride 0.9 % 100 mL IVPB  Status:  Discontinued       ? 2 g ?200 mL/hr over 30 Minutes Intravenous Every 24 hours 05/22/21 2105 05/22/21 2114  ? 05/22/21 2115  azithromycin (ZITHROMAX) 500 mg in sodium chloride 0.9 % 250 mL IVPB  Status:  Discontinued       ? 500 mg ?250 mL/hr over 60 Minutes Intravenous Every 24 hours 05/22/21 2105 05/22/21 2114  ? 05/22/21 2030  cefTRIAXone (ROCEPHIN) 2 g in sodium  chloride 0.9 % 100 mL IVPB       ? 2 g ?200 mL/hr over 30 Minutes Intravenous Every 24 hours 05/22/21 2019 05/27/21 2029  ? 05/22/21 2030  azithromycin (ZITHROMAX) 500 mg in sodium chloride 0.9 % 250 mL IVPB       ? 500 mg ?250 mL/hr over 60 Minutes Intravenous Every 24 hours 05/22/21 2019 05/27/21 2029  ? ?  ? ? ? ? ? ? ? ? ? ?Family Communication/Anticipated D/C date and plan/Code Status  ? ?DVT prophylaxis: enoxaparin (LOVENOX) injection 40 mg Start: 05/22/21 2200 ? ? ?  Code Status: Full Code ? ?Family Communication: None ?Disposition Plan: Plan to discharge home tomorrow ? ? ?Status is:  Inpatient ?Remains inpatient appropriate because: On IV antibiotics  ? ? ? ? ? ? ?Subjective:  ? ?Interval events noted.  He complains of generalized weakness and dizziness but feels a little better today.  He still has a cough productive of yellowish phlegm.  He had fever yesterday evening with Tmax of 101.3 ?F.  No shortness of breath. ? ?Objective:  ? ? ?Vitals:  ? 05/24/21 1800 05/24/21 1955 05/25/21 0432 05/25/21 0906  ?BP:  (!) 90/59 111/68 (!) 92/53  ?Pulse:  70 78 68  ?Resp:  16 20 20   ?Temp: 98.4 ?F (36.9 ?C) 97.7 ?F (36.5 ?C) 99.1 ?F (37.3 ?C) 98 ?F (36.7 ?C)  ?TempSrc:  Oral Oral   ?SpO2:  95% 95% 96%  ?Weight:      ?Height:      ? ?No data found. ? ? ? ?Intake/Output Summary (Last 24 hours) at 05/25/2021 1356 ?Last data filed at 05/25/2021 1014 ?Gross per 24 hour  ?Intake 610 ml  ?Output 700 ml  ?Net -90 ml  ? ?Filed Weights  ? 05/22/21 1843 05/22/21 2308  ?Weight: 72.6 kg 71.4 kg  ? ? ?Exam: ? ?GEN: NAD ?SKIN: No rash ?EYES: EOMI ?ENT: MMM ?CV: RRR ?PULM: No wheezing or rales heard ?ABD: soft, ND, NT, +BS ?CNS: AAO x 3, non focal ?EXT: No edema or tenderness ? ? ? ? ?  ? ? ?Data Reviewed:  ? ?I have personally reviewed following labs and imaging studies: ? ?Labs: ?Labs show the following:  ? ?Basic Metabolic Panel: ?Recent Labs  ?Lab 05/22/21 ?1859 05/23/21 ?0134 05/24/21 ?6433 05/25/21 ?0441  ?NA 135 133* 137 136  ?K 4.5 4.3 4.7 4.2  ?CL 100 101 102 102  ?CO2 27 27 27 31   ?GLUCOSE 110* 106* 88 109*  ?BUN 41* 40* 29* 24*  ?CREATININE 1.25* 1.13 0.99 1.03  ?CALCIUM 12.7* 11.9* 11.9* 11.2*  ?MG  --   --  2.1  --   ? ?GFR ?Estimated Creatinine Clearance: 75.1 mL/min (by C-G formula based on SCr of 1.03 mg/dL). ?Liver Function Tests: ?Recent Labs  ?Lab 05/22/21 ?1859 05/25/21 ?0441  ?AST 21 21  ?ALT 18 19  ?ALKPHOS 168* 194*  ?BILITOT 1.3* 0.6  ?PROT 7.3 5.7*  ?ALBUMIN 2.1* 1.7*  ? ?Recent Labs  ?Lab 05/22/21 ?1859  ?LIPASE 33  ? ?No results for input(s): AMMONIA in the last 168 hours. ?Coagulation  profile ?No results for input(s): INR, PROTIME in the last 168 hours. ? ?CBC: ?Recent Labs  ?Lab 05/22/21 ?1859 05/23/21 ?0134 05/24/21 ?2951 05/25/21 ?0441  ?WBC 25.7* 21.0* 17.2* 18.0*  ?NEUTROABS 21.8*  --  13.9* 14.8*  ?HGB 12.0* 10.6* 11.6* 11.1*  ?HCT 37.6* 33.6* 38.1* 35.8*  ?MCV 80.3 81.6 83.6 82.3  ?PLT 712* 605* 681* 729*  ? ?  Cardiac Enzymes: ?No results for input(s): CKTOTAL, CKMB, CKMBINDEX, TROPONINI in the last 168 hours. ?BNP (last 3 results) ?No results for input(s): PROBNP in the last 8760 hours. ?CBG: ?No results for input(s): GLUCAP in the last 168 hours. ?D-Dimer: ?No results for input(s): DDIMER in the last 72 hours. ?Hgb A1c: ?No results for input(s): HGBA1C in the last 72 hours. ?Lipid Profile: ?No results for input(s): CHOL, HDL, LDLCALC, TRIG, CHOLHDL, LDLDIRECT in the last 72 hours. ?Thyroid function studies: ?No results for input(s): TSH, T4TOTAL, T3FREE, THYROIDAB in the last 72 hours. ? ?Invalid input(s): FREET3 ?Anemia work up: ?Recent Labs  ?  05/25/21 ?0441  ?FERRITIN 637*  ?TIBC 115*  ?IRON 14*  ? ?Sepsis Labs: ?Recent Labs  ?Lab 05/22/21 ?1859 05/22/21 ?2352 05/23/21 ?0134 05/24/21 ?3419 05/25/21 ?0441  ?WBC 25.7*  --  21.0* 17.2* 18.0*  ?LATICACIDVEN  --  1.3 1.2  --   --   ? ? ?Microbiology ?Recent Results (from the past 240 hour(s))  ?Resp Panel by RT-PCR (Flu A&B, Covid) Nasopharyngeal Swab     Status: None  ? Collection Time: 05/22/21  6:46 PM  ? Specimen: Nasopharyngeal Swab; Nasopharyngeal(NP) swabs in vial transport medium  ?Result Value Ref Range Status  ? SARS Coronavirus 2 by RT PCR NEGATIVE NEGATIVE Final  ?  Comment: (NOTE) ?SARS-CoV-2 target nucleic acids are NOT DETECTED. ? ?The SARS-CoV-2 RNA is generally detectable in upper respiratory ?specimens during the acute phase of infection. The lowest ?concentration of SARS-CoV-2 viral copies this assay can detect is ?138 copies/mL. A negative result does not preclude SARS-Cov-2 ?infection and should not be used as the sole  basis for treatment or ?other patient management decisions. A negative result may occur with  ?improper specimen collection/handling, submission of specimen other ?than nasopharyngeal swab, presence of viral mutation(s) withi

## 2021-05-25 NOTE — Plan of Care (Signed)
?  Problem: Clinical Measurements: ?Goal: Ability to maintain clinical measurements within normal limits will improve ?Outcome: Progressing ?Goal: Will remain free from infection ?Outcome: Progressing ?Goal: Diagnostic test results will improve ?Outcome: Progressing ?Goal: Respiratory complications will improve ?Outcome: Progressing ?Goal: Cardiovascular complication will be avoided ?Outcome: Progressing ?  ?Problem: Pain Managment: ?Goal: General experience of comfort will improve ?Outcome: Progressing ?  ?Pt is involved in and agrees with the plan of care. V/S stable. Tylenol given for back pain. Denies SOB.  ?

## 2021-05-25 NOTE — Progress Notes (Signed)
Physical Therapy Treatment ?Patient Details ?Name: Lucas Burke ?MRN: [id number] ?DOB: 04/21/1960 ?Today's Date: 05/25/2021 ? ? ?History of Present Illness Lucas Burke is a 61 y.o. male with medical history significant for With history of chronic back pain who was brought in by EMS with a 1 week history of generalized weakness, poor oral intake and unintentional weight loss. ? ?  ?PT Comments  ? ? Pt received  supine in bed agreeable to PT services. Pt continuing to report dizziness with OOB mobility and per EMR BP continues to be soft. Performance of LE exercises in supine and seated EOB with education on compensatory strategies of LE therex for improving HR and BP to mitigate dizziness.  Pt able to transfer to EOB and standing with supervision and minguard to stand to RW with VC's for hand placement and tolerate ambulating in room without onset of dizziness but endorses feeling "woozy". With supervision, pt urinating in restroom in standing with good standing balance. Post urination pt returning to seated in recliner with safe sequencing and hand placement with all needs in reach. D/c recs remain appropriate as pt is progressing in safe OOB performing household distances with AD.  ?  ?Recommendations for follow up therapy are one component of a multi-disciplinary discharge planning process, led by the attending physician.  Recommendations may be updated based on patient status, additional functional criteria and insurance authorization. ? ?Follow Up Recommendations ? Home health PT ?  ?  ?Assistance Recommended at Discharge Frequent or constant Supervision/Assistance  ?Patient can return home with the following A little help with walking and/or transfers;Assistance with cooking/housework;Assist for transportation;A little help with bathing/dressing/bathroom ?  ?Equipment Recommendations ? Rolling walker (2 wheels)  ?  ?Recommendations for Other Services   ? ? ?  ?Precautions / Restrictions  Precautions ?Precautions: Fall ?Restrictions ?Weight Bearing Restrictions: No  ?  ? ?Mobility ? Bed Mobility ?Overal bed mobility: Needs Assistance ?Bed Mobility: Supine to Sit ?  ?  ?Supine to sit: Supervision ?  ?  ?General bed mobility comments: no use of bed rails ?Patient Response: Cooperative ? ?Transfers ?Overall transfer level: Needs assistance ?Equipment used: Rolling walker (2 wheels) ?Transfers: Sit to/from Stand ?Sit to Stand: Min guard ?  ?  ?  ?  ?  ?General transfer comment: VC's for hand placement ?  ? ?Ambulation/Gait ?Ambulation/Gait assistance: Supervision ?Gait Distance (Feet): 25 Feet ?Assistive device: Rolling walker (2 wheels) ?Gait Pattern/deviations: Step-through pattern, Decreased stride length ?  ?  ?  ?General Gait Details: Slow but reciprocal gait with RW. Stable throughout. ? ? ?Stairs ?  ?  ?  ?  ?  ? ? ?Wheelchair Mobility ?  ? ?Modified Rankin (Stroke Patients Only) ?  ? ? ?  ?Balance Overall balance assessment: Needs assistance ?Sitting-balance support: Single extremity supported, Feet supported ?Sitting balance-Leahy Scale: Good ?  ?  ?Standing balance support: During functional activity ?Standing balance-Leahy Scale: Fair ?  ?  ?  ?  ?  ?  ?  ?  ?  ?  ?  ?  ?  ? ?  ?Cognition Arousal/Alertness: Awake/alert ?Behavior During Therapy: [facility] for tasks assessed/performed ?Overall Cognitive Status: Within Functional Limits for tasks assessed ?  ?  ?  ?  ?  ?  ?  ?  ?  ?  ?  ?  ?  ?  ?  ?  ?  ?  ?  ? ?  ?Exercises General Exercises - Lower Extremity ?Ankle Circles/Pumps: AROM,  Strengthening, Both, 20 reps, Supine ?Short Arc Quad: AROM, Strengthening, 15 reps, Supine ?Long Arc Quad: AROM, Seated, Strengthening, Both, 10 reps ?Hip ABduction/ADduction: AROM, Strengthening, Both, 10 reps, Supine ?Straight Leg Raises: AROM, Strengthening, Both, 10 reps, Supine ?Hip Flexion/Marching: AROM, Strengthening, Both, Seated ?Other Exercises ?Other Exercises: education of LE exercise for assist in  elevation of BP due to orthostatic dizziness ? ?  ?General Comments General comments (skin integrity, edema, etc.): SPO2 on RA >/= 90% with mobility and at rest ?  ?  ? ?Pertinent Vitals/Pain Pain Assessment ?Pain Assessment: Faces ?Faces Pain Scale: Hurts a little bit ?Pain Location: L shoulder ?Pain Intervention(s): Limited activity within patient's tolerance, Repositioned  ? ? ?Home Living   ?  ?  ?  ?  ?  ?  ?  ?  ?  ?   ?  ?Prior Function    ?  ?  ?   ? ?PT Goals (current goals can now be found in the care plan section) Acute Rehab PT Goals ?Patient Stated Goal: to get stronger ?PT Goal Formulation: With patient ?Time For Goal Achievement: 06/06/21 ?Potential to Achieve Goals: Good ?Progress towards PT goals: Progressing toward goals ? ?  ?Frequency ? ? ? Min 2X/week ? ? ? ?  ?PT Plan Current plan remains appropriate  ? ? ?Co-evaluation   ?  ?  ?  ?  ? ?  ?AM-PAC PT "6 Clicks" Mobility   ?Outcome Measure ? Help needed turning from your back to your side while in a flat bed without using bedrails?: A Little ?Help needed moving from lying on your back to sitting on the side of a flat bed without using bedrails?: A Little ?Help needed moving to and from a bed to a chair (including a wheelchair)?: A Little ?Help needed standing up from a chair using your arms (e.g., wheelchair or bedside chair)?: A Little ?Help needed to walk in hospital room?: A Little ?Help needed climbing 3-5 steps with a railing? : A Lot ?6 Click Score: 17 ? ?  ?End of Session Equipment Utilized During Treatment: Gait belt ?Activity Tolerance: Patient tolerated treatment well ?Patient left: in chair;with family/visitor present ?Nurse Communication: Mobility status ?PT Visit Diagnosis: Unsteadiness on feet (R26.81);Other abnormalities of gait and mobility (R26.89);Muscle weakness (generalized) (M62.81) ?  ? ? ?Time: 0388-8280 ?PT Time Calculation (min) (ACUTE ONLY): 20 min ? ?Charges:  $Therapeutic Exercise: 8-22 mins  ?         ?Salem Caster.  Fairly IV, PT, DPT ?Physical Therapist- [place]  ?[facility]  ?05/25/2021, 1:00 PM ? ?

## 2021-05-25 NOTE — Progress Notes (Signed)
? ?      CROSS COVER NOTE ? ?NAME: Lucas Burke ?MRN: [id number] ?DOB : [birth year] ? ?Secure chat received from nursing reporting auditory and olfactory hallucinations as well as agitation. Reports patient calls out for assistance and when staff enters room he curses at them; he states he smells fumes that staff are unable to smell; he believes there are bees in his room. ? ?On bedside evaluation [name] is alert, oriented x 4, and pleasant. Patient states he has no recollection of the above reports. Neuro exam without focal neurological deficits, previously noted Tremors present, he does exhibit some inattention during exam.  ? ?No evidence of ETOH abuse on chart review and patient states he does not drink. No other signs of ETOH withdrawal present. ? ? ?Plan: ? ?- Delirium precautions ?- Continue IV antibiotics for pneumonia ?- Consider head CT if symptoms reoccur ? ? ?

## 2021-05-25 NOTE — Progress Notes (Signed)
Patient is confused, hallucinating, and agitated. States that he is seeing fumes in his room. Called out for assistance and stated that there were bees in his bed. Patient has pulled out one IV. Patient redirected. Called out again for staff assistance then curses at staff when entering room to assist. Multiple attempts to get out of bed unassisted.  Requested phone to call daughter. Phone handed to patient. Morton Amy, NP notified via secure chat about patient's confusion and behavior.  ?

## 2021-05-26 DIAGNOSIS — E611 Iron deficiency: Secondary | ICD-10-CM | POA: Diagnosis present

## 2021-05-26 DIAGNOSIS — R634 Abnormal weight loss: Secondary | ICD-10-CM

## 2021-05-26 LAB — CBC WITH DIFFERENTIAL/PLATELET
Abs Immature Granulocytes: 0.19 10*3/uL — ABNORMAL HIGH (ref 0.00–0.07)
Basophils Absolute: 0.1 10*3/uL (ref 0.0–0.1)
Basophils Relative: 0 %
Eosinophils Absolute: 0.1 10*3/uL (ref 0.0–0.5)
Eosinophils Relative: 0 %
HCT: 31.5 % — ABNORMAL LOW (ref 39.0–52.0)
Hemoglobin: 9.9 g/dL — ABNORMAL LOW (ref 13.0–17.0)
Immature Granulocytes: 1 %
Lymphocytes Relative: 6 %
Lymphs Abs: 1.4 10*3/uL (ref 0.7–4.0)
MCH: 25.6 pg — ABNORMAL LOW (ref 26.0–34.0)
MCHC: 31.4 g/dL (ref 30.0–36.0)
MCV: 81.4 fL (ref 80.0–100.0)
Monocytes Absolute: 2.1 10*3/uL — ABNORMAL HIGH (ref 0.1–1.0)
Monocytes Relative: 10 %
Neutro Abs: 18.2 10*3/uL — ABNORMAL HIGH (ref 1.7–7.7)
Neutrophils Relative %: 83 %
Platelets: 683 10*3/uL — ABNORMAL HIGH (ref 150–400)
RBC: 3.87 MIL/uL — ABNORMAL LOW (ref 4.22–5.81)
RDW: 13.2 % (ref 11.5–15.5)
WBC: 22 10*3/uL — ABNORMAL HIGH (ref 4.0–10.5)
nRBC: 0 % (ref 0.0–0.2)

## 2021-05-26 LAB — VITAMIN D 25 HYDROXY (VIT D DEFICIENCY, FRACTURES): Vit D, 25-Hydroxy: 55.34 ng/mL (ref 30–100)

## 2021-05-26 MED ORDER — SODIUM CHLORIDE 0.9 % IV SOLN
2.0000 g | Freq: Three times a day (TID) | INTRAVENOUS | Status: DC
  Administered 2021-05-26 – 2021-05-28 (×7): 2 g via INTRAVENOUS
  Filled 2021-05-26 (×9): qty 2

## 2021-05-26 MED ORDER — ALUM & MAG HYDROXIDE-SIMETH 200-200-20 MG/5ML PO SUSP
30.0000 mL | ORAL | Status: DC | PRN
Start: 2021-05-26 — End: 2021-05-30
  Administered 2021-05-26 – 2021-05-28 (×3): 30 mL via ORAL
  Filled 2021-05-26 (×3): qty 30

## 2021-05-26 MED ORDER — SODIUM CHLORIDE 0.9 % IV SOLN: INTRAVENOUS | Status: AC

## 2021-05-26 NOTE — Progress Notes (Signed)
Speech Language Pathology Treatment: Dysphagia  ?Patient Details ?Name: Lucas Burke ?MRN: [id number] ?DOB: May 07, 1960 ?Today's Date: 05/26/2021 ?Time: 9373-4287 ?SLP Time Calculation (min) (ACUTE ONLY): 40 min ? ?Assessment / Plan / Recommendation ?Clinical Impression ? Pt seen for ongoing assessment of swallowing and toleration of diet. Trial upgrade of diet to Regular (in order for pt to have further choices of foods) at this session. He is alert, verbally responsive and engaged in conversation w/ SLP; followed all directions. Pt is on RA; afebrile. 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 Per MD notes, In the setting of recent weight loss, recurrent fevers, long smoking history, concern for underlying malignant process. Possibly related to underlying acute illness with some concern for underlying malignant process given hypercalcemia and recurrent fevers despite antibiotics. Pt has severe Protein-calorie malnutrition. No history of Pulmonary decline/pna per Imaging history in chart. ? ?Pt explained general aspiration precautions and agreed verbally to the need for following them especially sitting upright for all oral intake -- supported behind the back for more full upright sitting. Pt assisted w/ tray setup and cutting of foods/Pizza d/t shaky/tremorous hands(Baseline). He fed himself Lunch meal of solids(Cut) and thin liquids via straw. No immediate, overt clinical s/s of aspiration were noted w/ any consistency; respiratory status remained calm and unlabored, vocal quality clear b/t trials. A mild throat clearing b/t trials when pt was talking noted x2. Oral phase appeared grossly [facility] for bolus management and timely A-P transfer for swallowing given that he is Edentulous -- he stated he has been Edentulous for ~12 years. Oral clearing achieved w/ all consistencies. Instructed pt to gum/nmahs his foods well b/f swallowing; cut/pick up to eat SMALL pieces of foods only for best oral management. Pt frustrated w/ shaky UEs. NSG  denied any deficits in swallowing as well.  ?  ?Pt appears at/close to his swallowing Baseline and oral management of solid foods; Edentulous status Baseline He appears at reduced risk for aspriation when following general aspiration precautions. Recommend a Regular/mech soft diet for ease of soft foods w/ gravies added to moisten foods; Cut foods for ease of self-feeding. Thin liquids. Recommend general aspiration precautions; Pills Whole in Puree if any difficulty swallowing w/ liquids; tray setup and positioning assistance for meals and Cutting of foods d/t shaky UEs. ST services will sign off at this time w/ MD to reconsult if needed while admitted. NSG updated. Precautions posted at bedside.  ? ? ? ? ?  ?HPI HPI: Per H&P "Lucas Burke is a 61 y.o. male with medical history significant for With history of chronic back pain who was brought in by EMS with a 1 week history of generalized weakness, poor oral intake and unintentional weight loss.  On arrival of EMS O2 sat reportedly in the 80s on room air.  On arrival, tachypneic to 24 with O2 sat 93% on room air, BP initially 149/119, then trending down to 98/69 by admission.  Blood work significant for leukocytosis of 25,000.  Lactic acid not done.  Also had thrombocytosis of 712,000.  Hemoglobin 12, baseline unknown.  Creatinine 1.25 with no recent baseline.  Also noted to have elevated alk phos of 168 and elevated total bilirubin of 1.3.  Troponin 9.  Lipase normal.  EKG, personally viewed and interpreted showed NSR at 96 with nonspecific ST-T wave changes.  Chest x-ray showed the following:  IMPRESSION:  Opacification of the lower 1/2 of left hemithorax, likely  combination of pleural effusion and atelectasis and/or airspace  disease.  Patient started on Rocephin and azithromycin and given a 1 L fluid bolus.  Hospitalist consulted for admission.  RUQ ultrasound ordered from the ED just prior to admission." ?  ?   ?SLP Plan ? All goals met ? ?  ?   ?Recommendations for follow up therapy are one component of a multi-disciplinary discharge planning process, led by the attending physician.  Recommendations may be updated based on patient status, additional functional criteria and insurance authorization. ?  ? ?Recommendations  ?Diet recommendations: Regular;Thin liquid (cut meats, soft foods) ?Liquids provided via: Cup;Straw ?Medication Administration: Whole meds with puree (if needed for ease of swallowing) ?Supervision: Patient able to self feed (setup support by staff) ?Compensations: Minimize environmental distractions;Slow rate;Small sips/bites;Lingual sweep for clearance of pocketing;Follow solids with liquid ?Postural Changes and/or Swallow Maneuvers: Out of bed for meals;Seated upright 90 degrees;Upright 30-60 min after meal  ?   ?    ?   ? ? ? ? General recommendations:  (Dietician f/u) ?Oral Care Recommendations: Oral care BID;Oral care before and after PO;Patient independent with oral care (setup support by staff) ?Follow Up Recommendations: No SLP follow up ?Assistance recommended at discharge: Set up Supervision/Assistance ?SLP Visit Diagnosis: Dysphagia, unspecified (R13.10) (increased oral phase time w/ some foods d/t Edentulous status at baseline) ?Plan: All goals met ? ? ? ? ?  ?  ? ? ? ? ?Lucas Kenner, MS, CCC-SLP ?Speech Language Pathologist ?Rehab Services; [place] ?[phone] (ascom) ? ?Lucas Burke ? ?05/26/2021, 3:29 PM ?

## 2021-05-26 NOTE — Assessment & Plan Note (Signed)
Related to social and environmental circumstances (chronic poor appetite and poor oral intake), moderate to severe fat depletion, moderate to severe muscle depletion, percent weight loss. ?-- Appreciate dietitian recommendations ?-- Continue ensures, Magic cups and multivitamins ?-- Monitor closely for refeeding syndrome ?

## 2021-05-26 NOTE — Assessment & Plan Note (Addendum)
Present on admission with corrected calcium of 14.2 (calcium 12.7, albumin 2.1). ?In the setting of recent weight loss, recurrent fevers, long smoking history, concern for underlying malignant process. ?Treated with IV fluids and improved. ? ?Labs: Both vitamin D-1,25 and vitamin D-25 are normal.  Ionized calcium elevated 6.8. ?PTH low at 7. ? ?CT chest with: ?--Large left pleural effusion with loculations and possible small pockets of air. Possibility of empyema should be considered clinically... ?--Almost complete atelectasis of left lung with some residual aeration in the left apex. There is narrowing of left main bronchus in the left hilum. Possibility of central obstructing neoplastic process is not excluded. ?--There are abnormally enlarged lymph nodes in the mediastinum suggesting possible neoplastic process such as metastatic disease. ?? ?-- Follow-up pending PTHrP ?-- Further evaluation based on above ?-- Monitor off IV fluids, resume if needed ?-- Monitor BMP ?

## 2021-05-26 NOTE — Progress Notes (Signed)
PT Cancellation Note ? ?Patient Details ?Name: Lucas Burke ?MRN: [id number] ?DOB: [birth year] ? ? ?Cancelled Treatment:     PT attempt. Pt unwilling to cooperate. "I cant do it today. I'm all fucked up." PT will continue to follow and progress as able per current POC. ? ? ? ?Willette Pa ?05/26/2021, 4:20 PM ?

## 2021-05-26 NOTE — TOC Progression Note (Signed)
Transition of Care (TOC) - Progression Note  ? ? ?Patient Details  ?Name: SCHON ZEIDERS ?MRN: [id number] ?Date of Birth: 02-May-1960 ? ?Transition of Care (TOC) CM/SW Contact  ?Beverly Sessions, RN ?Phone Number: ?05/26/2021, 3:53 PM ? ?Clinical Narrative:    ? ?Confirmed with Zack with [employer] that RW was delivered 3/21 ? ?Expected Discharge Plan: [place] ?Barriers to Discharge: Continued Medical Work up ? ?Expected Discharge Plan and Services ?Expected Discharge Plan: [place] ?  ?  ?  ?Living arrangements for the past 2 months: [place] ?                ?DME Arranged: Walker rolling ?DME Agency: [employer] ?Date DME Agency Contacted: 05/23/21 ?  ?Representative spoke with at DME Agency: Delana Meyer ?HH Arranged: PT ?[place] Agency: [place] ([place]) ?Date HH Agency Contacted: 05/23/21 ?  ?Representative spoke with at [place]: Corene Cornea ? ? ?Social Determinants of Health (SDOH) Interventions ?  ? ?Readmission Risk Interventions ? ?  05/23/2021  ?  3:37 PM  ?Readmission Risk Prevention Plan  ?Post Dischage Appt Complete  ?Medication Screening Complete  ?Transportation Screening Complete  ? ? ?

## 2021-05-26 NOTE — Hospital Course (Addendum)
Lucas Burke is a 61 y.o. male with medical history significant for chronic back pain panic attacks, anxiety, tobacco use disorder, tremors, who presented to the hospital because of generalized weakness, poor oral intake and unintentional weight loss.  Reportedly, when EMS arrived, he was hypoxic with oxygen saturation of 80% on room air. ?  ?He was admitted to the hospital for sepsis secondary to pneumonia complicated by acute hypoxic respiratory failure.  Treated with empiric IV antibiotics, IV fluids and supplemental oxygen which is now weaned off.  Pt continued to spike fevers despite antibiotics.  Has a large empyema on left.  Chest tube placed by IR.  Now transferring to [name] for CCM and CT surgery to follow for attempt at lytic therapy, with expectation he may ultimately require VATS. ? ?Pt noted to have significant hypercalcemia,  with Ca on admission correcting to 14.2 when hypoalbuminemia taken into account.  Further evaluation of this is underway, but concern for underlying malignant process given weight loss over several months and intermittent fevers reported at home as well.   ?

## 2021-05-26 NOTE — Progress Notes (Signed)
Occupational Therapy Treatment ?Patient Details ?Name: Lucas Burke ?MRN: [id number] ?DOB: September 14, 1960 ?Today's Date: 05/26/2021 ? ? ?History of present illness Lucas Burke is a 61 y.o. male with medical history significant for With history of chronic back pain who was brought in by EMS with a 1 week history of generalized weakness, poor oral intake and unintentional weight loss. ?  ?OT comments ? Upon entering the room, pt supine in bed with visitor present. Pt reports not feeling well but is agreeable to attempt OT intervention. Pt performs bed mobility with supervision and sits on EOB but appears to reach out and grab urination suction tubing and IV when performing mobility and it is in his way. Pt stands without use of AD and min A. Pt then begins to urinate on floor as suction bag is no longer attached. Pt also feels very hot to the touch. OT assisted pt with total A for hygiene and clothing management to remove urine from skin before returning to bed. Pt also reports feeling SOB with O2 saturation being at 90% and pt given 1 L O2 via [place]. RN notified. All needs within reach and pt repositioned in bed. He clearly does not feel well and therefore OT stopped session at this time.  ? ?Recommendations for follow up therapy are one component of a multi-disciplinary discharge planning process, led by the attending physician.  Recommendations may be updated based on patient status, additional functional criteria and insurance authorization. ?   ?Follow Up Recommendations ? Home health OT  ?  ?Assistance Recommended at Discharge Intermittent Supervision/Assistance  ?Patient can return home with the following ? A little help with walking and/or transfers;A little help with bathing/dressing/bathroom;Assistance with cooking/housework;Help with stairs or ramp for entrance ?  ?Equipment Recommendations ? BSC/3in1;Other (comment) (RW)  ?  ?   ?Precautions / Restrictions Precautions ?Precautions: Fall ?Restrictions ?Weight  Bearing Restrictions: No  ? ? ?  ? ?Mobility Bed Mobility ?Overal bed mobility: Needs Assistance ?Bed Mobility: Supine to Sit, Sit to Supine ?  ?  ?Supine to sit: Supervision ?Sit to supine: Supervision ?  ?General bed mobility comments: no use of bed rails ?  ? ?Transfers ?  ?  ?  ?  ?  ?  ?  ?  ?  ?  ?  ?  ?Balance Overall balance assessment: Needs assistance ?Sitting-balance support: Single extremity supported, Feet supported ?Sitting balance-Leahy Scale: Fair ?  ?  ?Standing balance support: During functional activity ?Standing balance-Leahy Scale: Fair ?Standing balance comment: min A for balance ?  ?  ?  ?  ?  ?  ?  ?  ?  ?  ?  ?   ? ?ADL either performed or assessed with clinical judgement  ? ?ADL Overall ADL's : Needs assistance/impaired ?  ?  ?  ?  ?  ?  ?  ?  ?  ?  ?  ?  ?  ?  ?  ?  ?  ?  ?  ?General ADL Comments: Pt standing and began urinating in the floor. Total A for hygiene and clothing management. Pt not feels well and feels very warm to the touch RN notified. ?  ? ?Extremity/Trunk Assessment Upper Extremity Assessment ?Upper Extremity Assessment: Generalized weakness ?  ?Lower Extremity Assessment ?Lower Extremity Assessment: Generalized weakness ?  ?  ?  ? ?Vision Patient Visual Report: No change from baseline ?  ?  ?   ?   ? ?Cognition Arousal/Alertness: Awake/alert ?Behavior  During Therapy: [facility] for tasks assessed/performed ?Overall Cognitive Status: Within Functional Limits for tasks assessed ?  ?  ?  ?  ?  ?  ?  ?  ?  ?  ?  ?  ?  ?  ?  ?  ?General Comments: pleasant throughout. ?  ?  ?   ?   ?   ?   ? ? ?Pertinent Vitals/ Pain       Pain Assessment ?Pain Assessment: No/denies pain ? ?   ?   ? ?Frequency ? Min 2X/week  ? ? ? ? ?  ?Progress Toward Goals ? ?OT Goals(current goals can now be found in the care plan section) ? Progress towards OT goals: Progressing toward goals ? ?Acute Rehab OT Goals ?Patient Stated Goal: to go home and feel better ?OT Goal Formulation: With patient ?Time For Goal  Achievement: 06/07/21 ?Potential to Achieve Goals: Fair  ?Plan Discharge plan remains appropriate;Frequency remains appropriate   ? ?   ?AM-PAC OT "6 Clicks" Daily Activity     ?Outcome Measure ? ? Help from another person eating meals?: None ?Help from another person taking care of personal grooming?: A Little ?Help from another person toileting, which includes using toliet, bedpan, or urinal?: A Little ?Help from another person bathing (including washing, rinsing, drying)?: A Little ?Help from another person to put on and taking off regular upper body clothing?: A Little ?Help from another person to put on and taking off regular lower body clothing?: A Little ?6 Click Score: 19 ? ?  ?End of Session   ? ?OT Visit Diagnosis: Unsteadiness on feet (R26.81);Muscle weakness (generalized) (M62.81) ?  ?Activity Tolerance Patient limited by fatigue ?  ?Patient Left in bed;with call bell/phone within reach;with bed alarm set;Other (comment) ?  ?Nurse Communication Mobility status (fever? and purewick assistance) ?  ? ?   ? ?Time: 8466-5993 ?OT Time Calculation (min): 25 min ? ?Charges: OT General Charges ?$OT Visit: 1 Visit ?OT Treatments ?$Self Care/Home Management : 23-37 mins ? ?Darleen Crocker, [place], OTR/L , CBIS ?ascom [phone]  ?05/26/21, 3:38 PM  ?

## 2021-05-26 NOTE — Progress Notes (Signed)
Mobility Specialist - Progress Note ? ? 05/26/21 1000  ?Mobility  ?Activity Dangled on edge of bed  ?Range of Motion/Exercises Active  ?Activity Response Tolerated well  ?$Mobility charge 1 Mobility  ? ? ? ?Pt participated in bathing tasks d/t soiled sheets. New gown and linen provided, NT in for assistance. Pt agreeable to ambulation later this date. Pt left in bed with alarm set, needs in reach.  ? ? ?Kathee Delton ?Mobility Specialist ?05/26/21, 10:47 AM ? ? ? ? ?

## 2021-05-26 NOTE — Assessment & Plan Note (Addendum)
Iron 14, TIBC 115, sat ratio 12, ferritin 637 (?acute phase reactant in setting of acute illness). ?-- Continue oral iron supplement ?

## 2021-05-26 NOTE — Progress Notes (Addendum)
?Progress Note ? ? ?Patient: Lucas Burke GYI:[id number] DOB: Nov 05, 1960 DOA: 05/22/2021     4 ?DOS: the patient was seen and examined on 05/26/2021 ?  ?Brief hospital course: ?Lucas Burke is a 61 y.o. male with medical history significant for chronic back pain panic attacks, anxiety, tobacco use disorder, tremors, who presented to the hospital because of generalized weakness, poor oral intake and unintentional weight loss.  Reportedly, when EMS arrived, he was hypoxic with oxygen saturation of 80% on room air. ?  ?He was admitted to the hospital for sepsis secondary to pneumonia complicated by acute hypoxic respiratory failure.  Being treated with empiric IV antibiotics, IV fluids and oxygen via nasal cannula. ? ?Pt noted to have significant hypercalcemia with Ca 12.7 on admission, correcting to 14.2 when hypoalbuminemia taken into account.  Further evaluation of this is underway, but concern for underlying malignant process given weight loss over several months and intermittent fevers reported at home as well.   ? ?Assessment and Plan: ?* Pneumonia ?Severe sepsis -present on admission with leukocytosis, hypotension, altered mental status consistent with organ dysfunction, hypoxia with EMS. ?Started on Rocephin and Zithromax on admission. ?-- Change antibiotic to cefepime given ongoing fevers ?--SLP evaluation (?  Aspiration) ?-- Antipyretics, antitussives mucolytic's, pulmonary hygiene ?-- Supplement O2 if needed to maintain sat above 90% ? ? ? ? ? ?Thrombocytosis ?Secondary to acute illness ?Monitor CBC ? ? ?Hyperbilirubinemia ?Elevated bilirubin and alkaline phosphatase ?RUQ ultrasound showed gallbladder filled with sludge, no cholelithiasis or evidence of acute cholecystitis.  No evidence of bile duct obstruction.  Abnormal parenchymal echogenicity of the liver compared to the kidney differential considerations include acute hepatic edema such as due to hepatitis, versus abnormal kidney echogenicity due to  chronic medical renal disease. ? ?3/22 T. bili 0.6, resolved ? ?Physical debility ?PT recommended home health and rolling walker which has been arranged. ? ?Inadequate oral intake ?Management as above under unintentional weight loss.  Continue IV hydration ? ?Unintentional weight loss ?Possibly related to underlying acute illness with some concern for underlying malignant process given hypercalcemia and recurrent fevers despite antibiotics. ?Management as outlined. ?Dietitian following and patient started on supplements and vitamins. ? ? ?Iron deficiency ?Iron 14, TIBC 115, sat ratio 12, ferritin 637 (?acute phase reactant in setting of acute illness). ?-- Given concern for pneumonia, defer IV iron at this time ?-- Continue oral iron supplement ? ?Hypercalcemia ?Present on admission with corrected calcium of 14.2 (calcium 12.7, albumin 2.1). ?In the setting of recent weight loss, recurrent fevers, long smoking history, concern for underlying malignant process. ?-- Follow-up pending ionized calcium, PTH, PTH RP, vitamin D levels ?-- Consider chest CT ?-- Further evaluation based on above ?-- Continue IV fluids ?-- Follow labs daily ? ?Protein-calorie malnutrition, severe ?Related to social and environmental circumstances (chronic poor appetite and poor oral intake), moderate to severe fat depletion, moderate to severe muscle depletion, percent weight loss. ?-- Appreciate dietitian recommendations ?-- Continue ensures, Magic cups and multivitamins ?-- Monitor closely for refeeding syndrome ? ?Tremor ?chronic, stable ? ?Chronic back pain ?Pain control as needed. ?Mobility as tolerated ?Nonpharmacologic pain control ? ? ? ? ?  ? ?Subjective: Patient awake resting in bed when seen today.  He was seen by staff apparently hallucinating overnight, he he denies and this was not ongoing when seen by the night provider.  He reports 60 pounds weight loss over 5 months.  Also reports he been having fevers on and off for some  time at  home and never had this looked into.  Reports he quit smoking finally just 12 days ago (?  Weeks, unclear).  He denies any other acute complaints at this time ? ?Physical Exam: ?Vitals:  ? 05/25/21 1637 05/25/21 2008 05/26/21 4401 05/26/21 0758  ?BP:  94/64 (!) 110/59 109/71  ?Pulse:  82 87 82  ?Resp:  20 20 18   ?Temp: 99.9 ?F (37.7 ?C) 99 ?F (37.2 ?C) 99.1 ?F (37.3 ?C) 98.7 ?F (37.1 ?C)  ?TempSrc:  Oral Oral Oral  ?SpO2:  94% 94% 97%  ?Weight:      ?Height:      ? ?General exam: awake, alert, no acute distress, chronically ill-appearing ?HEENT: atraumatic, clear conjunctiva, anicteric sclera, moist mucus membranes, hearing grossly normal  ?Respiratory system: CTAB but diminished left base, no wheezes, rales or rhonchi, normal respiratory effort. ?Cardiovascular system: normal S1/S2, RRR, no pedal edema.   ?Gastrointestinal system: soft, nontender  ?Central nervous system: A&O x3. no gross focal neurologic deficits, normal speech ?Extremities: moves all, no edema, normal tone ?Skin: dry, intact, normal temperature ?Psychiatry: normal mood, congruent affect, judgement and insight appear normal ? ? ?Data Reviewed: ? ?Labs reviewed and notable for glucose 109, BUN 24, calcium 11.2, albumin 1.7 (corrected calcium 13.0), total protein 5.7, WBC 22, hemoglobin 9.9, platelets 683 down from 729 ? ?Family Communication: None at bedside on rounds.  Unsuccessful attempt to call daughter Overton Mam by phone this afternoon.  Will try later or tomorrow. ? ?Disposition: ?Status is: Inpatient ?Remains inpatient appropriate because: Severity of illness remaining on IV antibiotics with recurrent fevers.  Ongoing evaluation of hypercalcemia.   ? ? ? Planned Discharge Destination: Home with Home Health ? ? ? ?Time spent: 45 minutes ? ?Author: ?Ezekiel Slocumb, DO ?05/26/2021 2:07 PM ? ?For on call review [email or web address].  ?

## 2021-05-27 ENCOUNTER — Encounter: Payer: Self-pay | Admitting: Internal Medicine

## 2021-05-27 ENCOUNTER — Inpatient Hospital Stay: Payer: Medicaid Other

## 2021-05-27 DIAGNOSIS — J90 Pleural effusion, not elsewhere classified: Secondary | ICD-10-CM

## 2021-05-27 LAB — COMPREHENSIVE METABOLIC PANEL
ALT: 39 U/L (ref 0–44)
AST: 49 U/L — ABNORMAL HIGH (ref 15–41)
Albumin: 1.6 g/dL — ABNORMAL LOW (ref 3.5–5.0)
Alkaline Phosphatase: 189 U/L — ABNORMAL HIGH (ref 38–126)
Anion gap: 6 (ref 5–15)
BUN: 19 mg/dL (ref 8–23)
CO2: 28 mmol/L (ref 22–32)
Calcium: 10 mg/dL (ref 8.9–10.3)
Chloride: 99 mmol/L (ref 98–111)
Creatinine, Ser: 0.93 mg/dL (ref 0.61–1.24)
GFR, Estimated: >60 mL/min (ref >60)
Glucose, Bld: 110 mg/dL — ABNORMAL HIGH (ref 70–99)
Potassium: 4.2 mmol/L (ref 3.5–5.1)
Sodium: 133 mmol/L — ABNORMAL LOW (ref 135–145)
Total Bilirubin: 0.6 mg/dL (ref 0.3–1.2)
Total Protein: 5.8 g/dL — ABNORMAL LOW (ref 6.5–8.1)

## 2021-05-27 LAB — BASIC METABOLIC PANEL
Anion gap: 7 (ref 5–15)
BUN: 20 mg/dL (ref 8–23)
CO2: 27 mmol/L (ref 22–32)
Calcium: 9.9 mg/dL (ref 8.9–10.3)
Chloride: 99 mmol/L (ref 98–111)
Creatinine, Ser: 0.98 mg/dL (ref 0.61–1.24)
GFR, Estimated: >60 mL/min (ref >60)
Glucose, Bld: 116 mg/dL — ABNORMAL HIGH (ref 70–99)
Potassium: 4.2 mmol/L (ref 3.5–5.1)
Sodium: 133 mmol/L — ABNORMAL LOW (ref 135–145)

## 2021-05-27 LAB — CULTURE, BLOOD (ROUTINE X 2)
Culture: NO GROWTH
Culture: NO GROWTH
Special Requests: ADEQUATE
Special Requests: ADEQUATE

## 2021-05-27 LAB — CBC
HCT: 30.4 % — ABNORMAL LOW (ref 39.0–52.0)
Hemoglobin: 9.8 g/dL — ABNORMAL LOW (ref 13.0–17.0)
MCH: 26 pg (ref 26.0–34.0)
MCHC: 32.2 g/dL (ref 30.0–36.0)
MCV: 80.6 fL (ref 80.0–100.0)
Platelets: 659 10*3/uL — ABNORMAL HIGH (ref 150–400)
RBC: 3.77 MIL/uL — ABNORMAL LOW (ref 4.22–5.81)
RDW: 13.3 % (ref 11.5–15.5)
WBC: 21.3 10*3/uL — ABNORMAL HIGH (ref 4.0–10.5)
nRBC: 0 % (ref 0.0–0.2)

## 2021-05-27 LAB — PROCALCITONIN: Procalcitonin: 1.13 ng/mL

## 2021-05-27 LAB — PARATHYROID HORMONE, INTACT (NO CA): PTH: 7 pg/mL — ABNORMAL LOW (ref 15–65)

## 2021-05-27 LAB — MAGNESIUM: Magnesium: 1.8 mg/dL (ref 1.7–2.4)

## 2021-05-27 LAB — VITAMIN D 25 HYDROXY (VIT D DEFICIENCY, FRACTURES): Vit D, 25-Hydroxy: 43.63 ng/mL (ref 30–100)

## 2021-05-27 LAB — PHOSPHORUS: Phosphorus: 2.6 mg/dL (ref 2.5–4.6)

## 2021-05-27 LAB — LACTATE DEHYDROGENASE: LDH: 106 U/L (ref 98–192)

## 2021-05-27 LAB — CALCITRIOL (1,25 DI-OH VIT D): Vit D, 1,25-Dihydroxy: 43.3 pg/mL (ref 24.8–81.5)

## 2021-05-27 LAB — GLUCOSE, CAPILLARY: Glucose-Capillary: 148 mg/dL — ABNORMAL HIGH (ref 70–99)

## 2021-05-27 LAB — CALCIUM, IONIZED: Calcium, Ionized, Serum: 6.8 mg/dL — ABNORMAL HIGH (ref 4.5–5.6)

## 2021-05-27 MED ORDER — IOHEXOL 300 MG/ML  SOLN
75.0000 mL | Freq: Once | INTRAMUSCULAR | Status: AC | PRN
  Administered 2021-05-27: 75 mL via INTRAVENOUS

## 2021-05-27 MED ORDER — BLISTEX MEDICATED EX OINT
TOPICAL_OINTMENT | CUTANEOUS | Status: DC | PRN
  Filled 2021-05-27: qty 6.3

## 2021-05-27 NOTE — Progress Notes (Signed)
Mobility Specialist - Progress Note ? ? 05/27/21 1600  ?Mobility  ?Activity Stood at bedside;Dangled on edge of bed  ?Level of Assistance Standby assist, set-up cues, supervision of patient - no hands on  ?Assistive Device None;BSC  ?Distance Ambulated (ft) 5 ft  ?Activity Response Tolerated well  ?$Mobility charge 1 Mobility  ? ? ? ?Mobility responded to bed alarm. Pt standing EOB with bed rails still up attempting to use BSC. Urine on floor, mobility cleaned floors. Pt declined further activity at this time. Pt scooted to [facility] and returned supine, reminded to use call bell when needing assistance. Pt left in bed with alarm set, needs in reach.  ? ? ?Kathee Delton ?Mobility Specialist ?05/27/21, 4:07 PM ? ? ? ? ?

## 2021-05-27 NOTE — Consult Note (Signed)
? ? ? ?PULMONOLOGY ? ? ? ? ? ? ? ? ?Date: 05/27/2021,   ?MRN# [id number] Lucas Burke 1961/02/19 ? ? ?  ?AdmissionWeight: 72.6 kg                 ?CurrentWeight: 71.4 kg ? ?Referring provider: Dr. Arbutus Ped ? ? ?CHIEF COMPLAINT:  ? ?Loculated pleural effusion with febrile illness ? ? ?HISTORY OF PRESENT ILLNESS  ? ?This is a pleasant 61 year old male with a history of anxiety disorder chronic lumbago lifelong smoking history came in with complaints of acute hypoxemic respiratory failure found to be hypoxic in the mid 80s on arrival.  Met sepsis criteria and was admitted for pneumonia and received IV fluids and IV antimicrobials.  Blood work notable for hypercalcemia.  CT chest was performed tonight reviewed this independently with findings as evidenced below in pictorial documentation section. ? ? ?PAST MEDICAL HISTORY  ? ?Past Medical History:  ?Diagnosis Date  ?? Anxiety   ?? Chronic back pain   ?? Cigarette smoker   ?? Hypokalemia   ?? Panic attacks   ? ? ? ?SURGICAL HISTORY  ? ?Past Surgical History:  ?Procedure Laterality Date  ?? COLONOSCOPY  2010  ? ? ? ?FAMILY HISTORY  ? ?History reviewed. No pertinent family history. ? ? ?SOCIAL HISTORY  ? ?Social History  ? ?Tobacco Use  ?? Smoking status: Every Day  ?  Packs/day: 2.00  ?  Types: Cigarettes  ?? Smokeless tobacco: Never  ?Substance Use Topics  ?? Alcohol use: Not Currently  ? ? ? ?MEDICATIONS  ? ? ?Home Medication:  ?  ?Current Medication: ? ?Current Facility-Administered Medications:  ??  0.9 %  sodium chloride infusion, , Intravenous, Continuous, Ezekiel Slocumb, DO, Last Rate: 75 mL/hr at 05/27/21 1555, Infusion Verify at 05/27/21 1555 ??  acetaminophen (TYLENOL) tablet 650 mg, 650 mg, Oral, Q6H PRN, 650 mg at 05/27/21 1024 **OR** acetaminophen (TYLENOL) suppository 650 mg, 650 mg, Rectal, Q6H PRN, Athena Masse, MD ??  alum & mag hydroxide-simeth (MAALOX/MYLANTA) 200-200-20 MG/5ML suspension 30 mL, 30 mL, Oral, Q4H PRN, Nicole Kindred A, DO, 30 mL  at 05/26/21 2017 ??  ceFEPIme (MAXIPIME) 2 g in sodium chloride 0.9 % 100 mL IVPB, 2 g, Intravenous, Q8H, Nicole Kindred A, DO, Stopped at 05/27/21 1434 ??  enoxaparin (LOVENOX) injection 40 mg, 40 mg, Subcutaneous, Q24H, Judd Gaudier V, MD, 40 mg at 05/26/21 2021 ??  feeding supplement (ENSURE ENLIVE / ENSURE PLUS) liquid 237 mL, 237 mL, Oral, TID BM, Jennye Boroughs, MD, 237 mL at 05/27/21 1404 ??  ferrous sulfate tablet 325 mg, 325 mg, Oral, Tonye Pearson, MD, 325 mg at 05/27/21 1013 ??  gabapentin (NEURONTIN) capsule 300 mg, 300 mg, Oral, BID, Jennye Boroughs, MD, 300 mg at 05/27/21 1013 ??  multivitamin with minerals tablet 1 tablet, 1 tablet, Oral, Daily, Jennye Boroughs, MD, 1 tablet at 05/27/21 1013 ??  ondansetron (ZOFRAN) tablet 4 mg, 4 mg, Oral, Q6H PRN **OR** ondansetron (ZOFRAN) injection 4 mg, 4 mg, Intravenous, Q6H PRN, Athena Masse, MD, 4 mg at 05/23/21 1238 ??  PARoxetine (PAXIL) tablet 20 mg, 20 mg, Oral, Daily, Jennye Boroughs, MD, 20 mg at 05/27/21 1015 ? ? ? ?ALLERGIES  ? ?Codeine ? ? ? ? ?REVIEW OF SYSTEMS  ? ? ?Review of Systems: ? ?Gen:  Denies  fever, sweats, chills weigh loss  ?HEENT: Denies blurred vision, double vision, ear pain, eye pain, hearing loss, nose bleeds, sore throat ?Cardiac:  No dizziness,  chest pain or heaviness, chest tightness,edema ?Resp:   reports dyspnea chronically  ?Gi: Denies swallowing difficulty, stomach pain, nausea or vomiting, diarrhea, constipation, bowel incontinence ?Gu:  Denies bladder incontinence, burning urine ?Ext:   Denies Joint pain, stiffness or swelling ?Skin: Denies  skin rash, easy bruising or bleeding or hives ?Endoc:  Denies polyuria, polydipsia , polyphagia or weight change ?Psych:   Denies depression, insomnia or hallucinations  ? ?Other:  All other systems negative ? ? ?VS: BP 96/65 (BP Location: Left Arm)   Pulse 87   Temp 98.4 ?F (36.9 ?C) (Oral)   Resp 20   Ht 6' (1.829 m)   Wt 71.4 kg   SpO2 97%   BMI 21.35 kg/m?    ? ? ? ?PHYSICAL EXAM  ? ? ?GENERAL:NAD, no fevers, chills, no weakness no fatigue ?HEAD: Normocephalic, atraumatic.  ?EYES: Pupils equal, round, reactive to light. Extraocular muscles intact. No scleral icterus.  ?MOUTH: Moist mucosal membrane. Dentition intact. No abscess noted.  ?EAR, NOSE, THROAT: Clear without exudates. No external lesions.  ?NECK: Supple. No thyromegaly. No nodules. No JVD.  ?PULMONARY: decreased breath sounds with mild rhonchi worse at bases bilaterally.  ?CARDIOVASCULAR: S1 and S2. Regular rate and rhythm. No murmurs, rubs, or gallops. No edema. Pedal pulses 2+ bilaterally.  ?GASTROINTESTINAL: Soft, nontender, nondistended. No masses. Positive bowel sounds. No hepatosplenomegaly.  ?MUSCULOSKELETAL: No swelling, clubbing, or edema. Range of motion full in all extremities.  ?NEUROLOGIC: Cranial nerves II through XII are intact. No gross focal neurological deficits. Sensation intact. Reflexes intact.  ?SKIN: No ulceration, lesions, rashes, or cyanosis. Skin warm and dry. Turgor intact.  ?PSYCHIATRIC: Mood, affect within normal limits. The patient is awake, alert and oriented x 3. Insight, judgment intact.  ? ? ?  ? ?IMAGING  ? ? ? ?CT chest reviewed independently by me 05/27/2021-significant enlargement of mediastinal and hilar lymphadenopathy complete atelectasis of the left lung with surrounding compressive atelectasis due to pleural effusion.  Difficult to exclude postobstructive process of the left lung ? ?ASSESSMENT/PLAN  ? ?Acute hypoxemic respiratory failure ?- present on admission  ?- COVID19 negative ?- supplemental O2 during my evaluation room air ?- will perform infectious workup for pneumonia ?-Respiratory viral panel ?-serum fungitell ?-legionella ab ?-strep pneumoniae ur AG ?-Histoplasma Ur Ag ?-sputum resp cultures ?-AFB sputum expectorated specimen ?-sputum cytology  ?-reviewed pertinent imaging with patient today ?- ESR ?-PT/OT for d/c planning  ?-please encourage patient to use  incentive spirometer few times each hour while hospitalized.   ? ? ?Large pleural effusion with surrounding compressive atelectasis ?   -Ordered diagnostic and therapeutic thoracentesis   ?-Appropriate testing has been ordered as well ?-We will continue to follow for need to introduce chest tube versus thoracic surgery evaluate ?-Patient is on blood thinners via subcu Lovenox 40 mg every 24 hours ? ? ?Moderate protein calorie malnutrition ?Bitemporal wasting with peripheral muscle weakness ?-Portends a poor prognosis ?Albumin is 1.6 ?RD nutritional evaluation ? ? ?Severe hypercalcemia ?Calcium is higher than reported due to low albumin levels ?-Recommend ionized calcium level ?Vitamin D testing ? ? ? ? ? ? ?Thank you for allowing me to participate in the care of this patient.  ? ?Patient/Family are satisfied with care plan and all questions have been answered.  ? ? ?Provider disclosure: ?Patient with at least one acute or chronic illness or injury that poses a threat to life or bodily function and is being managed actively during this encounter.  All of the below services have  been performed independently by signing provider:  review of prior documentation from internal and or external health records.  Review of previous and current lab results.  Interview and comprehensive assessment during patient visit today. Review of current and previous chest radiographs/CT scans. Discussion of management and test interpretation with health care team and patient/family.  ? ?This document was prepared using Dragon voice recognition software and may include unintentional dictation errors. ? ? ?  ?Ottie Glazier, M.D.  ?Division of Pulmonary & Critical Care Medicine  ? ? ? ? ? ? ? ? ?

## 2021-05-27 NOTE — Progress Notes (Signed)
Physical Therapy Treatment ?Patient Details ?Name: Lucas Burke ?MRN: [id number] ?DOB: 1960-07-21 ?Today's Date: 05/27/2021 ? ? ?History of Present Illness Lucas Burke is a 61 y.o. male with medical history significant for With history of chronic back pain who was brought in by EMS with a 1 week history of generalized weakness, poor oral intake and unintentional weight loss. ? ?  ?PT Comments  ? ? Pt was long sitting in bed upon arriving. He is A and O x 4 and reports being up several times throughout the day. With encouragement he did agree to getting OOB. He was safely able to exit bed, stand, and ambulate without difficulty. Pt is very anxious about possible DC home tomorrow. Messaged CM/OT about DC disposition and home situation. HHPT at DC still most appropriate to assist pt to PLOF.  ?  ?Recommendations for follow up therapy are one component of a multi-disciplinary discharge planning process, led by the attending physician.  Recommendations may be updated based on patient status, additional functional criteria and insurance authorization. ? ?Follow Up Recommendations ? Home health PT ?  ?  ?Assistance Recommended at Discharge Frequent or constant Supervision/Assistance  ?Patient can return home with the following A little help with walking and/or transfers;Assistance with cooking/housework;Assist for transportation;A little help with bathing/dressing/bathroom ?  ?Equipment Recommendations ? Rolling walker (2 wheels)  ?  ?   ?Precautions / Restrictions Precautions ?Precautions: Fall ?Restrictions ?Weight Bearing Restrictions: No  ?  ? ?Mobility ? Bed Mobility ?Overal bed mobility: Needs Assistance ?Bed Mobility: Supine to Sit, Sit to Supine ? Supine to sit: Supervision ?Sit to supine: Supervision ?  ?  ?Transfers ?Overall transfer level: Needs assistance ?Equipment used: Rolling walker (2 wheels) ?Transfers: Sit to/from Stand ?Sit to Stand: Supervision ?  ?  ?Ambulation/Gait ?Ambulation/Gait assistance:  Supervision ?Gait Distance (Feet): 50 Feet ?Assistive device: Rolling walker (2 wheels) ?Gait Pattern/deviations: Step-through pattern, Decreased stride length ?Gait velocity: decreased ?  ?  ?General Gait Details: no LOB with ambulation with RW ? ? ?  ?Balance Overall balance assessment: Needs assistance ?Sitting-balance support: Feet supported, Bilateral upper extremity supported ?Sitting balance-Leahy Scale: Good ?  ?  ?Standing balance support: During functional activity, Bilateral upper extremity supported ?Standing balance-Leahy Scale: Good ?  ?  ?  ?  ?Cognition Arousal/Alertness: Awake/alert ?Behavior During Therapy: Anxious ?Overall Cognitive Status: Within Functional Limits for tasks assessed ?  ?   ?General Comments: Pt is pleasant and oriented however anxious about upcoming DC. ?  ?  ? ?  ?   ?General Comments General comments (skin integrity, edema, etc.): pt states he no longer has a home to DC to. CM informed and will follow up on DC disposition. he remains HHPT appropriate at Dc. ?  ?  ? ?Pertinent Vitals/Pain Pain Assessment ?Pain Assessment: No/denies pain ?Pain Score: 0-No pain  ? ? ? ?PT Goals (current goals can now be found in the care plan section) Acute Rehab PT Goals ?Patient Stated Goal: return home with my daughters ?Progress towards PT goals: Progressing toward goals ? ?  ?Frequency ? ? ? Min 2X/week ? ? ? ?  ?PT Plan Current plan remains appropriate  ? ? ?   ?AM-PAC PT "6 Clicks" Mobility   ?Outcome Measure ? Help needed turning from your back to your side while in a flat bed without using bedrails?: A Little ?Help needed moving from lying on your back to sitting on the side of a flat bed without using bedrails?: A Little ?  Help needed moving to and from a bed to a chair (including a wheelchair)?: A Little ?Help needed standing up from a chair using your arms (e.g., wheelchair or bedside chair)?: A Little ?Help needed to walk in hospital room?: A Little ?Help needed climbing 3-5 steps with  a railing? : A Little ?6 Click Score: 18 ? ?  ?End of Session Equipment Utilized During Treatment: Gait belt ?Activity Tolerance: Patient tolerated treatment well ?Patient left: in bed;with call bell/phone within reach;with bed alarm set ?Nurse Communication: Mobility status ?PT Visit Diagnosis: Unsteadiness on feet (R26.81);Other abnormalities of gait and mobility (R26.89);Muscle weakness (generalized) (M62.81) ?  ? ? ?Time: 1657-9038 ?PT Time Calculation (min) (ACUTE ONLY): 16 min ? ?Charges:  $Gait Training: 8-22 mins          ?          ?Julaine Fusi PTA ?05/27/21, 3:40 PM  ? ?

## 2021-05-27 NOTE — TOC Progression Note (Signed)
Transition of Care (TOC) - Progression Note  ? ? ?Patient Details  ?Name: Lucas Burke ?MRN: [id number] ?Date of Birth: 10/15/1960 ? ?Transition of Care (TOC) CM/SW Contact  ?Laurena Slimmer, RN ?Phone Number: ?05/27/2021, 3:35 PM ? ?Clinical Narrative:    ?Spoke with patient about discharge plan. Patient stated he lost the home he had previously lived in for the past 21/2 years. Stated he did not have any place to discharge to. States his daughters were doing a Photographer this weekend to raise money for housing. Attempted to contact daughter, Overton Mam no voice mail available. Request call back via text.  ? ? ? ?Expected Discharge Plan: [place] ?Barriers to Discharge: Continued Medical Work up ? ?Expected Discharge Plan and Services ?Expected Discharge Plan: [place] ?  ?  ?  ?Living arrangements for the past 2 months: [place] ?                ?DME Arranged: Walker rolling ?DME Agency: [employer] ?Date DME Agency Contacted: 05/23/21 ?  ?Representative spoke with at DME Agency: Delana Meyer ?HH Arranged: PT ?[place] Agency: [place] [facility] ?Date HH Agency Contacted: 05/23/21 ?  ?Representative spoke with at [place]: Corene Cornea ? ? ?Social Determinants of Health (SDOH) Interventions ?  ? ?Readmission Risk Interventions ? ?  05/23/2021  ?  3:37 PM  ?Readmission Risk Prevention Plan  ?Post Dischage Appt Complete  ?Medication Screening Complete  ?Transportation Screening Complete  ? ? ?

## 2021-05-27 NOTE — Assessment & Plan Note (Addendum)
Noted on admission chest x-ray, further clarified by CT chest on 3/23 as part of work-up for hypercalcemia and concern for malignancy. ?Pleural effusion is loculated with small air pockets, see full report. ?-- Pulmonology consulted ?-- Thoracentesis attempted 3/24, unable due to viscosity of fluid and loculations ?-- CT guided chest tube placed 3/24 ?-- Continue Unasyn ?

## 2021-05-27 NOTE — Progress Notes (Addendum)
?Progress Note ? ? ?Patient: Lucas Burke CVE:[id number] DOB: [birth year] DOA: 05/22/2021     5 ?DOS: the patient was seen and examined on 05/27/2021 ?  ?Brief hospital course: ?Lucas Burke is a 61 y.o. male with medical history significant for chronic back pain panic attacks, anxiety, tobacco use disorder, tremors, who presented to the hospital because of generalized weakness, poor oral intake and unintentional weight loss.  Reportedly, when EMS arrived, he was hypoxic with oxygen saturation of 80% on room air. ?  ?He was admitted to the hospital for sepsis secondary to pneumonia complicated by acute hypoxic respiratory failure.  Being treated with empiric IV antibiotics, IV fluids and oxygen via nasal cannula. ? ?Pt noted to have significant hypercalcemia with Ca 12.7 on admission, correcting to 14.2 when hypoalbuminemia taken into account.  Further evaluation of this is underway, but concern for underlying malignant process given weight loss over several months and intermittent fevers reported at home as well.   ? ?Assessment and Plan: ?* Pneumonia ?Severe sepsis -present on admission with leukocytosis, hypotension, altered mental status consistent with organ dysfunction, hypoxia with EMS. ?Started on Rocephin and Zithromax on admission. ?3/22 transitioned to cefepime due to ongoing fevers ?-- Continue cefepime ?--SLP evaluation (?  Aspiration) ?-- Antipyretics, antitussives mucolytic's, pulmonary hygiene ?-- Supplement O2 if needed to maintain sat above 90% ? ? ? ? ? ?Hypercalcemia ?AdditionPresent on admission with corrected calcium of 14.2 (calcium 12.7, albumin 2.1). ?In the setting of recent weight loss, recurrent fevers, long smoking history, concern for underlying malignant process. ? ?Labs: Both vitamin D-1,25 and vitamin D-25 are normal.  Ionized calcium ? ?CT chest with contrast for further evaluation: ?IMPRESSION: ?--Large left pleural effusion with loculations and possible small pockets of air.  Possibility of empyema should be considered clinically. Thoracentesis as clinically warranted should be ?considered. ?--There is almost complete atelectasis of left lung with some residual aeration in the left apex. There is narrowing of left main bronchus in the left hilum. Possibility of central obstructing neoplastic ?process is not excluded. ?--There are abnormally enlarged lymph nodes in the mediastinum suggesting possible neoplastic process such as metastatic disease. ?  ? ?-- Follow-up pending PTH and PTHrP ?-- Consult pulmonology ?-- Further evaluation based on above ?-- Continue IV fluids ? ?Protein-calorie malnutrition, severe ?Related to social and environmental circumstances (chronic poor appetite and poor oral intake), moderate to severe fat depletion, moderate to severe muscle depletion, percent weight loss. ?-- Appreciate dietitian recommendations ?-- Continue ensures, Magic cups and multivitamins ?-- Monitor closely for refeeding syndrome ? ?Hyperbilirubinemia ?Elevated bilirubin and alkaline phosphatase ?RUQ ultrasound showed gallbladder filled with sludge, no cholelithiasis or evidence of acute cholecystitis.  No evidence of bile duct obstruction.  Abnormal parenchymal echogenicity of the liver compared to the kidney differential considerations include acute hepatic edema such as due to hepatitis, versus abnormal kidney echogenicity due to chronic medical renal disease. ? ?3/22 T. bili 0.6, resolved ? ?Physical debility ?PT recommended home health and rolling walker which has been arranged. ? ?Inadequate oral intake ?Management as above under unintentional weight loss.  Continue IV hydration ? ?Unintentional weight loss ?Possibly related to underlying acute illness with some concern for underlying malignant process given hypercalcemia and recurrent fevers despite antibiotics. ?Management as outlined. ?Dietitian following and patient started on supplements and  vitamins. ? ? ?Thrombocytosis ?Secondary to acute illness ?Monitor CBC ? ? ?Iron deficiency ?Iron 14, TIBC 115, sat ratio 12, ferritin 637 (?acute phase reactant in setting of acute illness). ?--  Given concern for pneumonia, defer IV iron at this time ?-- Continue oral iron supplement ? ?Chronic back pain ?Pain control as needed. ?Mobility as tolerated ?Nonpharmacologic pain control ? ?Tremor ?chronic, stable ? ?Pleural effusion, left ?Noted on admission chest x-ray, further clarified by CT chest on 3/23 as part of work-up for hypercalcemia and concern for malignancy. ?Pleural effusion is loculated with small air pockets, see full report. ?-- Pulmonology consulted, follow-up recommendations ?-- Likely needs diagnostic thoracentesis  ?-- Continue cefepime ? ? ? ? ?  ? ?Subjective: Patient was awake resting in bed when seen today.  He reports still being short of breath.  He does say he sometimes notices shortness of breath much worse when he lays on his side.  He also has some chest discomfort on that side.  He says that been notable since he got sick initially a few weeks ago.  Denies fevers or chills ? ?Physical Exam: ?Vitals:  ? 05/26/21 1939 05/27/21 0347 05/27/21 0738 05/27/21 1038  ?BP: 91/67 (!) 103/59 100/60 95/64  ?Pulse: 77 83 75 87  ?Resp: _0 ?Temp: 98.3 ?F (36.8 ?C)  98.3 ?F (36.8 ?C)   ?TempSrc:   Oral   ?SpO2: 98% 94% 95%   ?Weight:      ?Height:      ? ?General exam: awake, alert, no acute distress ?HEENT: moist mucus membranes, hearing grossly normal  ?Respiratory system: CTAB but severely diminished on the left, no wheezes, rales or rhonchi, normal respiratory effort at rest, on room air. ?Cardiovascular system: normal S1/S2, RRR, no pedal edema.   ?Central nervous system: A&O x3. no gross focal neurologic deficits, normal speech ?Extremities: moves all, no edema, normal tone ?Skin: dry, intact, normal temperature ?Psychiatry: normal mood, congruent affect, judgement and insight appear  normal ? ? ?Data Reviewed: ? ?Labs reviewed and notable for sodium 133, glucose 110, ionized calcium elevated 6.8, alk phos 189 improved, albumin 1.6, AST 49, ALT 39, total protein 5.8, procalcitonin elevated 1.13, normal vitamin D 1, 25, normal vitamin D 25, white count 21.3, hemoglobin stable 9.8, platelets improved 659 ? ? ?CT chest with contrast ?IMPRESSION: ?Large left pleural effusion with loculations and possible small ?pockets of air. Possibility of empyema should be considered ?clinically. Thoracentesis as clinically warranted should be ?considered. ?  ?There is almost complete atelectasis of left lung with some residual ?aeration in the left apex. There is narrowing of left main bronchus ?in the left hilum. Possibility of central obstructing neoplastic ?process is not excluded. ?  ?There are abnormally enlarged lymph nodes in the mediastinum ?suggesting possible neoplastic process such as metastatic disease. ?  ?Small linear density in the posteromedial right lower lung fields ?may suggest scarring or subsegmental atelectasis. ?  ?Other findings as described in the body of the report. ? ? ? ? ?Family Communication: None at bedside, will attempt to call daughter Overton Mam this afternoon ? ?Disposition: ?Status is: Inpatient ?Remains inpatient appropriate because: Severity of illness with ongoing evaluation not appropriate for outpatient setting.  Remains on IV antibiotics.   ? ? ? Planned Discharge Destination: Home with Home Health ? ? ? ?Time spent: 45 minutes ? ?Author: ?Ezekiel Slocumb, DO ?05/27/2021 2:39 PM ? ?For on call review [email or web address].  ?

## 2021-05-28 ENCOUNTER — Inpatient Hospital Stay: Payer: Medicaid Other

## 2021-05-28 DIAGNOSIS — E209 Hypoparathyroidism, unspecified: Secondary | ICD-10-CM | POA: Clinically undetermined

## 2021-05-28 LAB — CBC
HCT: 32 % — ABNORMAL LOW (ref 39.0–52.0)
Hemoglobin: 10.1 g/dL — ABNORMAL LOW (ref 13.0–17.0)
MCH: 25.6 pg — ABNORMAL LOW (ref 26.0–34.0)
MCHC: 31.6 g/dL (ref 30.0–36.0)
MCV: 81 fL (ref 80.0–100.0)
Platelets: 702 10*3/uL — ABNORMAL HIGH (ref 150–400)
RBC: 3.95 MIL/uL — ABNORMAL LOW (ref 4.22–5.81)
RDW: 13.4 % (ref 11.5–15.5)
WBC: 22 10*3/uL — ABNORMAL HIGH (ref 4.0–10.5)
nRBC: 0 % (ref 0.0–0.2)

## 2021-05-28 LAB — BASIC METABOLIC PANEL
Anion gap: 7 (ref 5–15)
BUN: 20 mg/dL (ref 8–23)
CO2: 27 mmol/L (ref 22–32)
Calcium: 10 mg/dL (ref 8.9–10.3)
Chloride: 99 mmol/L (ref 98–111)
Creatinine, Ser: 1 mg/dL (ref 0.61–1.24)
GFR, Estimated: >60 mL/min (ref >60)
Glucose, Bld: 102 mg/dL — ABNORMAL HIGH (ref 70–99)
Potassium: 4.3 mmol/L (ref 3.5–5.1)
Sodium: 133 mmol/L — ABNORMAL LOW (ref 135–145)

## 2021-05-28 LAB — ALBUMIN: Albumin: 1.7 g/dL — ABNORMAL LOW (ref 3.5–5.0)

## 2021-05-28 MED ORDER — FENTANYL CITRATE (PF) 100 MCG/2ML IJ SOLN
INTRAMUSCULAR | Status: DC | PRN
  Administered 2021-05-28: 50 ug via INTRAVENOUS

## 2021-05-28 MED ORDER — FENTANYL CITRATE (PF) 100 MCG/2ML IJ SOLN
INTRAMUSCULAR | Status: AC
  Filled 2021-05-28: qty 2

## 2021-05-28 MED ORDER — SODIUM CHLORIDE 0.9 % IV SOLN
INTRAVENOUS | Status: DC | PRN
Start: 2021-05-28 — End: 2021-05-30

## 2021-05-28 MED ORDER — ACETAMINOPHEN 650 MG RE SUPP: 650.0000 mg | Freq: Four times a day (QID) | RECTAL | Status: DC | PRN

## 2021-05-28 MED ORDER — FENTANYL CITRATE PF 50 MCG/ML IJ SOSY
12.5000 ug | PREFILLED_SYRINGE | INTRAMUSCULAR | Status: DC | PRN
  Administered 2021-05-28: 12.5 ug via INTRAVENOUS
  Filled 2021-05-28 (×2): qty 1

## 2021-05-28 MED ORDER — ACETAMINOPHEN 500 MG PO TABS
1000.0000 mg | ORAL_TABLET | Freq: Four times a day (QID) | ORAL | Status: DC | PRN
  Administered 2021-05-29 – 2021-05-30 (×2): 1000 mg via ORAL
  Filled 2021-05-28 (×2): qty 2

## 2021-05-28 MED ORDER — AMPICILLIN-SULBACTAM SODIUM 3 (2-1) G IJ SOLR
3.0000 g | Freq: Four times a day (QID) | INTRAMUSCULAR | Status: DC
Start: 2021-05-28 — End: 2021-05-30
  Administered 2021-05-28 – 2021-05-30 (×9): 3 g via INTRAVENOUS
  Filled 2021-05-28: qty 3
  Filled 2021-05-28: qty 8
  Filled 2021-05-28: qty 3
  Filled 2021-05-28 (×2): qty 8
  Filled 2021-05-28: qty 3
  Filled 2021-05-28 (×4): qty 8

## 2021-05-28 NOTE — Progress Notes (Signed)
? ? ? ?PULMONOLOGY ? ? ? ? ? ? ? ? ?Date: 05/28/2021,   ?MRN# [id number] LIAHM GRIVAS 1960/11/19 ? ? ?  ?AdmissionWeight: 72.6 kg                 ?CurrentWeight: 71.4 kg ? ?Referring provider: Dr. Arbutus Ped ? ? ?CHIEF COMPLAINT:  ? ?Loculated pleural effusion with febrile illness ? ? ?HISTORY OF PRESENT ILLNESS  ? ?This is a pleasant 61 year old male with a history of anxiety disorder chronic lumbago lifelong smoking history came in with complaints of acute hypoxemic respiratory failure found to be hypoxic in the mid 80s on arrival.  Met sepsis criteria and was admitted for pneumonia and received IV fluids and IV antimicrobials.  Blood work notable for hypercalcemia.  CT chest was performed tonight reviewed this independently with findings as evidenced below in pictorial documentation section. ? ?05/28/21-patient is stable in bed  no overnight acute events. Reviewed plan for thoracentesis with IR however it seems patient may need chest tube for complex pleural effusion.  ? ? ?PAST MEDICAL HISTORY  ? ?Past Medical History:  ?Diagnosis Date  ?? Anxiety   ?? Chronic back pain   ?? Cigarette smoker   ?? Hypokalemia   ?? Panic attacks   ? ? ? ?SURGICAL HISTORY  ? ?Past Surgical History:  ?Procedure Laterality Date  ?? COLONOSCOPY  2010  ? ? ? ?FAMILY HISTORY  ? ?History reviewed. No pertinent family history. ? ? ?SOCIAL HISTORY  ? ?Social History  ? ?Tobacco Use  ?? Smoking status: Every Day  ?  Packs/day: 2.00  ?  Types: Cigarettes  ?? Smokeless tobacco: Never  ?Substance Use Topics  ?? Alcohol use: Not Currently  ? ? ? ?MEDICATIONS  ? ? ?Home Medication:  ?  ?Current Medication: ? ?Current Facility-Administered Medications:  ??  acetaminophen (TYLENOL) tablet 650 mg, 650 mg, Oral, Q6H PRN, 650 mg at 05/27/21 1949 **OR** acetaminophen (TYLENOL) suppository 650 mg, 650 mg, Rectal, Q6H PRN, Athena Masse, MD ??  alum & mag hydroxide-simeth (MAALOX/MYLANTA) 200-200-20 MG/5ML suspension 30 mL, 30 mL, Oral, Q4H PRN,  Nicole Kindred A, DO, 30 mL at 05/27/21 2007 ??  ceFEPIme (MAXIPIME) 2 g in sodium chloride 0.9 % 100 mL IVPB, 2 g, Intravenous, Q8H, Griffith, Kelly A, DO, Last Rate: 200 mL/hr at 05/28/21 0629, 2 g at 05/28/21 3428 ??  enoxaparin (LOVENOX) injection 40 mg, 40 mg, Subcutaneous, Q24H, Judd Gaudier V, MD, 40 mg at 05/27/21 2117 ??  feeding supplement (ENSURE ENLIVE / ENSURE PLUS) liquid 237 mL, 237 mL, Oral, TID BM, Jennye Boroughs, MD, 237 mL at 05/27/21 2013 ??  ferrous sulfate tablet 325 mg, 325 mg, Oral, Tonye Pearson, MD, 325 mg at 05/27/21 1013 ??  gabapentin (NEURONTIN) capsule 300 mg, 300 mg, Oral, BID, Jennye Boroughs, MD, 300 mg at 05/27/21 2117 ??  lip balm (BLISTEX) ointment, , Topical, PRN, Ezekiel Slocumb, DO, Given at 05/27/21 2211 ??  multivitamin with minerals tablet 1 tablet, 1 tablet, Oral, Daily, Jennye Boroughs, MD, 1 tablet at 05/27/21 1013 ??  ondansetron (ZOFRAN) tablet 4 mg, 4 mg, Oral, Q6H PRN **OR** ondansetron (ZOFRAN) injection 4 mg, 4 mg, Intravenous, Q6H PRN, Athena Masse, MD, 4 mg at 05/23/21 1238 ??  PARoxetine (PAXIL) tablet 20 mg, 20 mg, Oral, Daily, Jennye Boroughs, MD, 20 mg at 05/27/21 1015 ? ? ? ?ALLERGIES  ? ?Codeine ? ? ? ? ?REVIEW OF SYSTEMS  ? ? ?Review of Systems: ? ?Gen:  Denies  fever, sweats, chills weigh loss  ?HEENT: Denies blurred vision, double vision, ear pain, eye pain, hearing loss, nose bleeds, sore throat ?Cardiac:  No dizziness, chest pain or heaviness, chest tightness,edema ?Resp:   reports dyspnea chronically  ?Gi: Denies swallowing difficulty, stomach pain, nausea or vomiting, diarrhea, constipation, bowel incontinence ?Gu:  Denies bladder incontinence, burning urine ?Ext:   Denies Joint pain, stiffness or swelling ?Skin: Denies  skin rash, easy bruising or bleeding or hives ?Endoc:  Denies polyuria, polydipsia , polyphagia or weight change ?Psych:   Denies depression, insomnia or hallucinations  ? ?Other:  All other systems negative ? ? ?VS: BP  97/62 (BP Location: Right Arm)   Pulse 84   Temp 99.3 ?F (37.4 ?C) (Oral)   Resp 18   Ht 6' (1.829 m)   Wt 71.4 kg   SpO2 96%   BMI 21.35 kg/m?   ? ? ? ?PHYSICAL EXAM  ? ? ?GENERAL:NAD, no fevers, chills, no weakness no fatigue ?HEAD: Normocephalic, atraumatic.  ?EYES: Pupils equal, round, reactive to light. Extraocular muscles intact. No scleral icterus.  ?MOUTH: Moist mucosal membrane. Dentition intact. No abscess noted.  ?EAR, NOSE, THROAT: Clear without exudates. No external lesions.  ?NECK: Supple. No thyromegaly. No nodules. No JVD.  ?PULMONARY: decreased breath sounds with mild rhonchi worse at bases bilaterally.  ?CARDIOVASCULAR: S1 and S2. Regular rate and rhythm. No murmurs, rubs, or gallops. No edema. Pedal pulses 2+ bilaterally.  ?GASTROINTESTINAL: Soft, nontender, nondistended. No masses. Positive bowel sounds. No hepatosplenomegaly.  ?MUSCULOSKELETAL: No swelling, clubbing, or edema. Range of motion full in all extremities.  ?NEUROLOGIC: Cranial nerves II through XII are intact. No gross focal neurological deficits. Sensation intact. Reflexes intact.  ?SKIN: No ulceration, lesions, rashes, or cyanosis. Skin warm and dry. Turgor intact.  ?PSYCHIATRIC: Mood, affect within normal limits. The patient is awake, alert and oriented x 3. Insight, judgment intact.  ? ? ?  ? ?IMAGING  ? ? ? ?CT chest reviewed independently by me 05/27/2021-significant enlargement of mediastinal and hilar lymphadenopathy complete atelectasis of the left lung with surrounding compressive atelectasis due to pleural effusion.  Difficult to exclude postobstructive process of the left lung ? ?ASSESSMENT/PLAN  ? ?Acute hypoxemic respiratory failure ?- present on admission  ?- COVID19 negative ?- supplemental O2 during my evaluation room air ?- will perform infectious workup for pneumonia ?-Respiratory viral panel ?-serum fungitell ?-legionella ab ?-strep pneumoniae ur AG ?-Histoplasma Ur Ag ?-sputum resp cultures ?-AFB sputum  expectorated specimen ?-sputum cytology  ?-reviewed pertinent imaging with patient today ?- ESR ?-PT/OT for d/c planning  ?-please encourage patient to use incentive spirometer few times each hour while hospitalized.   ? ? ?Large pleural effusion with surrounding compressive atelectasis ?   -Ordered diagnostic and therapeutic thoracentesis   ?-Appropriate testing has been ordered as well ?-reivewed with IR regarding chest tube ?-Patient is on blood thinners via subcu Lovenox 40 mg every 24 hours ? ? ?Moderate protein calorie malnutrition ?Bitemporal wasting with peripheral muscle weakness ?-Portends a poor prognosis ?Albumin is 1.6 ?RD nutritional evaluation ? ? ?Severe hypercalcemia ?Calcium is higher than reported due to low albumin levels ?-Recommend ionized calcium level ?Vitamin D testing ? ? ? ? ? ? ?Thank you for allowing me to participate in the care of this patient.  ? ?Patient/Family are satisfied with care plan and all questions have been answered.  ? ? ?Provider disclosure: ?Patient with at least one acute or chronic illness or injury that poses a threat to   life or bodily function and is being managed actively during this encounter.  All of the below services have been performed independently by signing provider:  review of prior documentation from internal and or external health records.  Review of previous and current lab results.  Interview and comprehensive assessment during patient visit today. Review of current and previous chest radiographs/CT scans. Discussion of management and test interpretation with health care team and patient/family.  ? ?This document was prepared using Dragon voice recognition software and may include unintentional dictation errors. ? ? ?  ?Ottie Glazier, M.D.  ?Division of Pulmonary & Critical Care Medicine  ? ? ? ? ? ? ? ? ?

## 2021-05-28 NOTE — Progress Notes (Signed)
OT Cancellation Note ? ?Patient Details ?Name: Lucas Burke ?MRN: [id number] ?DOB: [birth year] ? ? ?Cancelled Treatment:    Reason Eval/Treat Not Completed: Patient at procedure or test/ unavailable. Pt currently off the unit for procedure. OT to re-attempt when pt is next available.  ? ?Darleen Crocker, [place], OTR/L , CBIS ?ascom [phone]  ?05/28/21, 1:56 PM  ?

## 2021-05-28 NOTE — Procedures (Signed)
Interventional Radiology Procedure Note ? ?Procedure: CT 14 FR LEFT CHEST TUBE   ? ?Complications: None ? ?Estimated Blood Loss:  0 ? ?Findings: ?180CC PURULENT PLEURAL FLD ASPIRATED ?CX SENT ? ?FULL REPORT IN PACS ?   ? ?M. Daryll Brod, MD ? ? ? ?

## 2021-05-28 NOTE — Progress Notes (Signed)
Patient clinically stable post CT placement per Dr Annamaria Boots, tolerated well. Received local anesthetic along with Fentanyl 50 mcg IV for procedure. Attached 14 Fr CT to pleuravac to suction per orders,with culture sent to lab, pulled off 180 ml tan/purulent drainage from ct upon placement per Dr Annamaria Boots. Bedsdie report givent to [employer] on [place] post procedure/recovery. ?

## 2021-05-28 NOTE — Progress Notes (Signed)
Cross Cover ?Patient with severe pain 9/10 at chest tube site after acetaminophen.   ?Fentanyl added as needed for severe pain (codeine allergy). Acetaminophen dose increased ?

## 2021-05-28 NOTE — Progress Notes (Signed)
Mobility Specialist - Progress Note ? ? 05/28/21 1515  ?Therapy Vitals  ?Pulse Rate 88  ?Resp 16  ?BP 134/78  ?Oxygen Therapy  ?SpO2 99 %  ?O2 Device Room Air  ?Mobility  ?Activity Off unit  ? ? ? ?Pt off unit for CT at time of arrival. Will attempt at another date and time. ? ?Lucas Burke ?Mobility Specialist ?05/28/21, 3:38 PM ? ? ? ? ?

## 2021-05-28 NOTE — Progress Notes (Signed)
Physical Therapy Treatment ?Patient Details ?Name: Lucas Burke ?MRN: [id number] ?DOB: [birth year] ?Today's Date: 05/28/2021 ? ? ?History of Present Illness LAUREANO HETZER is a 61 y.o. male with medical history significant for With history of chronic back pain who was brought in by EMS with a 1 week history of generalized weakness, poor oral intake and unintentional weight loss. ? ?  ?PT Comments  ? ? Pt was supine in bed upon arriving. He is A and O x 4 and cooperative throughout. Does endorse anxiety about procedure later this date. He was safely and easily able to exit bed, stand, and ambulate with RW. Pt also ambulated without AD with CGA for safety. Recommend continued use of RW until full strength and balance improves. Overall pt tolerated well. HHPT at DC still most appropriate.Acute PT will continue to follow and progress as able per current POC.  ? ?  ?Recommendations for follow up therapy are one component of a multi-disciplinary discharge planning process, led by the attending physician.  Recommendations may be updated based on patient status, additional functional criteria and insurance authorization. ? ?Follow Up Recommendations ? Home health PT ?  ?  ?Assistance Recommended at Discharge Frequent or constant Supervision/Assistance  ?Patient can return home with the following A little help with walking and/or transfers;Assistance with cooking/housework;Assist for transportation;A little help with bathing/dressing/bathroom ?  ?Equipment Recommendations ? Rolling walker (2 wheels)  ?  ?   ?Precautions / Restrictions Precautions ?Precautions: Fall ?Restrictions ?Weight Bearing Restrictions: No  ?  ? ?Mobility ? Bed Mobility ?Overal bed mobility: Needs Assistance ?Bed Mobility: Supine to Sit, Sit to Supine ?  ?  ?Supine to sit: Supervision ?Sit to supine: Supervision ?  ?  ?  ? ?Transfers ?Overall transfer level: Needs assistance ?Equipment used: Rolling walker (2 wheels), None ?Transfers: Sit to/from  Stand ?Sit to Stand: Supervision, Min guard ?  ?  ?  ?  ?  ?General transfer comment: CGA without AD, supervision with use or RW ?  ? ?Ambulation/Gait ?Ambulation/Gait assistance: Supervision ?Gait Distance (Feet): 200 Feet ?Assistive device: Rolling walker (2 wheels), None ?Gait Pattern/deviations: Step-through pattern ?Gait velocity: decreased ?  ?  ?General Gait Details: pt ambulated 100 ft with RW and 100 ft without AD. recommend use of AD until full strength/balance returns ? ? ?  ?Balance Overall balance assessment: Needs assistance ?Sitting-balance support: Feet supported, Bilateral upper extremity supported ?Sitting balance-Leahy Scale: Good ?  ?  ?Standing balance support: During functional activity, Bilateral upper extremity supported ?Standing balance-Leahy Scale: Good ?  ?  ?  ?Cognition Arousal/Alertness: Awake/alert ?Behavior During Therapy: Anxious ?Overall Cognitive Status: Within Functional Limits for tasks assessed ?  ?   ?General Comments: Pt is pleasant and oriented however anxious about procedure later this date. ?  ?  ? ?  ?   ?   ? ?Pertinent Vitals/Pain Pain Assessment ?Pain Assessment: No/denies pain ?Pain Score: 0-No pain ?Pain Intervention(s): Limited activity within patient's tolerance  ? ? ? ?PT Goals (current goals can now be found in the care plan section) Acute Rehab PT Goals ?Patient Stated Goal: return home with my daughters ?Progress towards PT goals: Progressing toward goals ? ?  ?Frequency ? ? ? Min 2X/week ? ? ? ?  ?PT Plan Current plan remains appropriate  ? ? ?   ?AM-PAC PT "6 Clicks" Mobility   ?Outcome Measure ? Help needed turning from your back to your side while in a flat bed without using bedrails?: A Little ?  Help needed moving from lying on your back to sitting on the side of a flat bed without using bedrails?: A Little ?Help needed moving to and from a bed to a chair (including a wheelchair)?: A Little ?Help needed standing up from a chair using your arms (e.g.,  wheelchair or bedside chair)?: A Little ?Help needed to walk in hospital room?: A Little ?Help needed climbing 3-5 steps with a railing? : A Little ?6 Click Score: 18 ? ?  ?End of Session Equipment Utilized During Treatment: Gait belt ?Activity Tolerance: Patient tolerated treatment well ?Patient left: in bed;with call bell/phone within reach;with bed alarm set ?Nurse Communication: Mobility status ?PT Visit Diagnosis: Unsteadiness on feet (R26.81);Other abnormalities of gait and mobility (R26.89);Muscle weakness (generalized) (M62.81) ?  ? ? ?Time: 0761-5183 ?PT Time Calculation (min) (ACUTE ONLY): 14 min ? ?Charges:  $Gait Training: 8-22 mins          ?          ?Julaine Fusi PTA ?05/28/21, 12:52 PM  ? ?

## 2021-05-28 NOTE — Consult Note (Signed)
? ?Chief Complaint: ?Patient was seen in consultation today for  ?Chief Complaint  ?Patient presents with  ? Weakness  ? ? ?Referring Physician(s): Dr. Lanney Gins ? ?Supervising Physician: Daryll Brod ? ?Patient Status: [facility] - In-pt ? ?History of Present Illness: ?Lucas Burke is a 61 y.o. male with a medical history significant for anxiety and panic attacks, chronic back pain and tobacco use. He presented to the [facility] ED 05/22/21 for generalized weakness, poor oral intake and unintentional weight loss. Work up in the ED was notable for O2 levels in the 80's on room air, hypotension, leukocytosis (25k) and thrombocytosis (712k). He was admitted for pneumonia with severe sepsis. Additional imaging obtained.   ? ?CT Chest with contrast 05/27/21 ?IMPRESSION: ?1. Large left pleural effusion with loculations and possible small ?pockets of air. Possibility of empyema should be considered ?clinically. Thoracentesis as clinically warranted should be ?considered. ?2. There is almost complete atelectasis of left lung with some residual ?aeration in the left apex. There is narrowing of left main bronchus ?in the left hilum. Possibility of central obstructing neoplastic ?process is not excluded. ?3. There are abnormally enlarged lymph nodes in the mediastinum ?suggesting possible neoplastic process such as metastatic disease. ?4. Small linear density in the posteromedial right lower lung fields ?may suggest scarring or subsegmental atelectasis. ? ?Interventional Radiology was asked to evaluate this patient for a possible thoracentesis. Limited ultrasound examination of the left lung fields revealed thick, loculated fluid and our team reached out to the patient's primary teams for further discussion.  ? ?Past Medical History:  ?Diagnosis Date  ? Anxiety   ? Chronic back pain   ? Cigarette smoker   ? Hypokalemia   ? Panic attacks   ? ? ?Past Surgical History:  ?Procedure Laterality Date  ?  COLONOSCOPY  2010  ? ? ?Allergies: ?Codeine ? ?Medications: ?Prior to Admission medications   ?Medication Sig Start Date End Date Taking? Authorizing Provider  ?gabapentin (NEURONTIN) 300 MG capsule Take 300 mg by mouth 2 (two) times daily.   Yes [provider]  ?PARoxetine (PAXIL) 20 MG tablet Take 1 tablet by mouth daily. 09/09/20  Yes [provider]  ?acetaminophen (TYLENOL) 325 MG tablet Take 650 mg by mouth every 6 (six) hours as needed.    [provider]  ?ibuprofen (ADVIL) 800 MG tablet Take 800 mg by mouth every 8 (eight) hours as needed. 04/17/21   [provider]  ?  ? ?History reviewed. No pertinent family history. ? ?Social History  ? ?Socioeconomic History  ? Marital status: Married  ?  Spouse name: Not on file  ? Number of children: Not on file  ? Years of education: Not on file  ? Highest education level: Not on file  ?Occupational History  ? Not on file  ?Tobacco Use  ? Smoking status: Every Day  ?  Packs/day: 2.00  ?  Types: Cigarettes  ? Smokeless tobacco: Never  ?Substance and Sexual Activity  ? Alcohol use: Not Currently  ? Drug use: Not on file  ? Sexual activity: Not on file  ?Other Topics Concern  ? Not on file  ?Social History Narrative  ? Not on file  ? ?Social Determinants of Health  ? ?Financial Resource Strain: Not on file  ?Food Insecurity: Not on file  ?Transportation Needs: Not on file  ?Physical Activity: Not on file  ?Stress: Not on file  ?Social Connections: Not on file  ? ? ?Review of Systems: A  12 point ROS discussed and pertinent positives are indicated in the HPI above.  All other systems are negative. ? ?Review of Systems  ?Constitutional:  Positive for appetite change and fatigue.  ?Respiratory:  Positive for cough and shortness of breath.   ?     Left chest wall pain   ?Cardiovascular:  Negative for chest pain and leg swelling.  ?Gastrointestinal:  Negative for abdominal pain, diarrhea, nausea and vomiting.  ?Neurological:  Negative for  dizziness and headaches.  ? ?Vital Signs: ?BP 97/62 (BP Location: Right Arm)   Pulse 84   Temp 99.3 ?F (37.4 ?C) (Oral)   Resp 18   Ht 6' (1.829 m)   Wt 157 lb 6.5 oz (71.4 kg)   SpO2 96%   BMI 21.35 kg/m?  ? ?Physical Exam ?Constitutional:   ?   General: He is not in acute distress. ?   Appearance: He is underweight. He is ill-appearing.  ?Cardiovascular:  ?   Rate and Rhythm: Normal rate and regular rhythm.  ?   Pulses: Normal pulses.  ?   Heart sounds: Normal heart sounds.  ?Pulmonary:  ?   Effort: Pulmonary effort is normal.  ?   Comments: Rhonchi, left side  ?Abdominal:  ?   General: Bowel sounds are normal.  ?   Palpations: Abdomen is soft.  ?Musculoskeletal:  ?   Right lower leg: No edema.  ?   Left lower leg: No edema.  ?Skin: ?   General: Skin is warm and dry.  ?Neurological:  ?   Mental Status: He is alert and oriented to person, place, and time.  ?   Motor: Tremor present.  ?Psychiatric:     ?   Mood and Affect: Mood is anxious.  ? ? ?Imaging: ?CT CHEST W CONTRAST ? ?Result Date: 05/27/2021 ?CLINICAL DATA:  Weakness, cough, weight loss EXAM: CT CHEST WITH CONTRAST TECHNIQUE: Multidetector CT imaging of the chest was performed during intravenous contrast administration. RADIATION DOSE REDUCTION: This exam was performed according to the departmental dose-optimization program which includes automated exposure control, adjustment of the mA and/or kV according to patient size and/or use of iterative reconstruction technique. CONTRAST:  38mL OMNIPAQUE IOHEXOL 300 MG/ML  SOLN COMPARISON:  Previous studies including the chest radiographs done on 05/22/2021 and CT chest done on 12/10/2009 FINDINGS: Cardiovascular: There is homogeneous enhancement in thoracic aorta. There are no intraluminal filling defects in central pulmonary artery branches. Mediastinum/Nodes: There are enlarged lymph nodes in the mediastinum largest in the subcarinal region measuring approximally 3.7 x 2.4 cm. Lungs/Pleura: There is  narrowing of left main bronchus in the left hilum. There is almost complete atelectasis of left lung with some residual aeration in the left apex. Large left pleural effusion is present. Loculations are seen in the left pleural effusion with possible small pockets of air. There is linear density in the posterior right lower lung fields suggesting scarring or subsegmental atelectasis. Right lung is otherwise unremarkable. Upper Abdomen: Unremarkable. Musculoskeletal: Degenerative changes are noted in the lower cervical spine with encroachment of neural foramina. IMPRESSION: Large left pleural effusion with loculations and possible small pockets of air. Possibility of empyema should be considered clinically. Thoracentesis as clinically warranted should be considered. There is almost complete atelectasis of left lung with some residual aeration in the left apex. There is narrowing of left main bronchus in the left hilum. Possibility of central obstructing neoplastic process is not excluded. There are abnormally enlarged lymph nodes in the mediastinum suggesting possible neoplastic  process such as metastatic disease. Small linear density in the posteromedial right lower lung fields may suggest scarring or subsegmental atelectasis. Other findings as described in the body of the report. Electronically Signed   By: Elmer Picker M.D.   On: 05/27/2021 10:43  ? ?DG Chest Portable 1 View ? ?Result Date: 05/22/2021 ?CLINICAL DATA:  Weakness. Cough. Weight loss. Shortness of breath. EXAM: PORTABLE CHEST 1 VIEW COMPARISON:  Remote radiograph 12/11/2009 FINDINGS: Opacification of the lower 1/2 of left hemithorax, likely combination of pleural effusion and atelectasis and/or airspace disease. The heart is grossly normal in size, although the left heart border is obscured. No pulmonary edema. The right lung is clear. No visualized pneumothorax, although the lung apices are partially excluded from the field of view. On limited  evaluation, no acute osseous abnormalities are seen. IMPRESSION: Opacification of the lower 1/2 of left hemithorax, likely combination of pleural effusion and atelectasis and/or airspace disease. Electronically Signed   By:

## 2021-05-28 NOTE — Progress Notes (Signed)
?Progress Note ? ? ?Patient: Lucas Burke UEA:[id number] DOB: 25-Dec-1960 DOA: 05/22/2021     6 ?DOS: the patient was seen and examined on 05/28/2021 ?  ?Brief hospital course: ?Lucas Burke is a 61 y.o. male with medical history significant for chronic back pain panic attacks, anxiety, tobacco use disorder, tremors, who presented to the hospital because of generalized weakness, poor oral intake and unintentional weight loss.  Reportedly, when EMS arrived, he was hypoxic with oxygen saturation of 80% on room air. ?  ?He was admitted to the hospital for sepsis secondary to pneumonia complicated by acute hypoxic respiratory failure.  Being treated with empiric IV antibiotics, IV fluids and oxygen via nasal cannula. ? ?Pt noted to have significant hypercalcemia with Ca 12.7 on admission, correcting to 14.2 when hypoalbuminemia taken into account.  Further evaluation of this is underway, but concern for underlying malignant process given weight loss over several months and intermittent fevers reported at home as well.   ? ?Work-up for hypercalcemia including chest CT revealed large left pleural effusion which appears loculated and with small air pockets.  IR consulted for thoracentesis.  May require placement of chest tube. ? ?Assessment and Plan: ?* Pneumonia ?Severe sepsis -present on admission with leukocytosis, hypotension, altered mental status consistent with organ dysfunction, hypoxia with EMS. ?Started on Rocephin and Zithromax on admission. ?3/22 transitioned to cefepime due to ongoing fevers ?-- Continue cefepime ?--SLP evaluation (?  Aspiration) ?-- Antipyretics, antitussives mucolytic's, pulmonary hygiene ?-- Supplement O2 if needed to maintain sat above 90% ? ? ? ? ? ?Hypercalcemia ?Present on admission with corrected calcium of 14.2 (calcium 12.7, albumin 2.1). ?In the setting of recent weight loss, recurrent fevers, long smoking history, concern for underlying malignant process. ? ?Labs: Both vitamin  D-1,25 and vitamin D-25 are normal.  Ionized calcium elevated 6.8. ?PTH low at 7. ? ?CT chest with contrast for further evaluation: ?IMPRESSION: ?--Large left pleural effusion with loculations and possible small pockets of air. Possibility of empyema should be considered clinically. Thoracentesis as clinically warranted should be ?considered. ?--There is almost complete atelectasis of left lung with some residual aeration in the left apex. There is narrowing of left main bronchus in the left hilum. Possibility of central obstructing neoplastic ?process is not excluded. ?--There are abnormally enlarged lymph nodes in the mediastinum suggesting possible neoplastic process such as metastatic disease. ?  ?-- Follow-up pending PTHrP ?-- Further evaluation based on above ?-- Monitor off IV fluids ?-- Monitor BMP ? ?Protein-calorie malnutrition, severe ?Related to social and environmental circumstances (chronic poor appetite and poor oral intake), moderate to severe fat depletion, moderate to severe muscle depletion, percent weight loss. ?-- Appreciate dietitian recommendations ?-- Continue ensures, Magic cups and multivitamins ?-- Monitor closely for refeeding syndrome ? ?Hyperbilirubinemia ?Elevated bilirubin and alkaline phosphatase ?RUQ ultrasound showed gallbladder filled with sludge, no cholelithiasis or evidence of acute cholecystitis.  No evidence of bile duct obstruction.  Abnormal parenchymal echogenicity of the liver compared to the kidney differential considerations include acute hepatic edema such as due to hepatitis, versus abnormal kidney echogenicity due to chronic medical renal disease. ? ?3/22 T. bili 0.6, resolved ? ?Physical debility ?PT recommended home health and rolling walker which has been arranged. ? ?Inadequate oral intake ?Management as above under unintentional weight loss.  Off IV hydration. Monitor. ? ?Unintentional weight loss ?Possibly related to underlying acute illness with some concern  for underlying malignant process given hypercalcemia and recurrent fevers despite antibiotics. ?Management as outlined. ?Dietitian following  and patient started on supplements and vitamins. ? ? ?Thrombocytosis ?Secondary to acute illness ?Monitor CBC ? ? ?Iron deficiency ?Iron 14, TIBC 115, sat ratio 12, ferritin 637 (?acute phase reactant in setting of acute illness). ?-- Given concern for pneumonia, defer IV iron at this time ?-- Continue oral iron supplement ? ?Chronic back pain ?Pain control as needed. ?Mobility as tolerated ?Nonpharmacologic pain control ? ?Tremor ?chronic, stable ? ?Hypoparathyroidism (Central) ?PTH low at 7.  Suprisingly, with elevated calcium.  No history of thyroid surgeries or other thyroid pathology.  Does have small palpable nodule on the left. ?Last Phos level normal. ?-- Thyroid ultrasound pending ? ?Pleural effusion, left ?Noted on admission chest x-ray, further clarified by CT chest on 3/23 as part of work-up for hypercalcemia and concern for malignancy. ?Pleural effusion is loculated with small air pockets, see full report. ?-- Pulmonology consulted - follow up recs ?-- Thoracentesis attempted 3/24, unable due to thick appearance of fluid and loculations ?-- CT guided chest tube is planned ?-- Continue cefepime ? ? ? ? ?  ? ?Subjective: Patient awake sitting up in recliner when seen today.  He had come back from ultrasound where thoracentesis was not able to be performed due to the thickness and loculations of fluid seen on ultrasound.  Patient continues to have intermittent cough with left-sided chest discomfort but otherwise feeling okay.  Denies any history of thyroid disease or thyroid surgeries. ? ?Physical Exam: ?Vitals:  ? 05/28/21 0350 05/28/21 0747 05/28/21 1346 05/28/21 1414  ?BP: 104/72 97/62  108/62  ?Pulse: 83 84  91  ?Resp: 18 18  20   ?Temp: 99 ?F (37.2 ?C) 99.3 ?F (37.4 ?C) 98.8 ?F (37.1 ?C)   ?TempSrc:  Oral Oral   ?SpO2: 91% 96%  100%  ?Weight:      ?Height:       ? ?General exam: awake, alert, no acute distress ?HEENT: Bitemporal wasting, moist mucus membranes, hearing grossly normal  ?Respiratory system: CTAB but diminished on the left, no wheezes, rales or rhonchi, normal respiratory effort. ?Cardiovascular system: normal S1/S2, RRR, no pedal edema.   ?Central nervous system: A&O x3. no gross focal neurologic deficits, normal speech ?Extremities: moves all, no edema, normal tone ?Skin: dry, intact, normal temperature ?Psychiatry: normal mood, congruent affect, judgement and insight appear normal ? ? ? ?Data Reviewed: ? ?Labs reviewed and notable for albumin 1.7, PTH low at 7, white count stable 22.0, hemoglobin stable 10.1, platelets up slightly 702 ? ?Family Communication: None at bedside on rounds, will attempt to call oldest daughter Lucas Burke [phone]). ? ? ? ?Disposition: ?Status is: Inpatient ?Remains inpatient appropriate because: Severity of illness with ongoing evaluation as outlined and remaining on IV antibiotics ? ? ? Planned Discharge Destination: Home with Home Health ? ? ? ?Time spent: 45 minutes ? ?Author: ?Ezekiel Slocumb, DO ?05/28/2021 2:18 PM ? ?For on call review [email or web address].  ?

## 2021-05-28 NOTE — TOC Progression Note (Signed)
Transition of Care (TOC) - Progression Note  ? ? ?Patient Details  ?Name: Lucas Burke ?MRN: [id number] ?Date of Birth: 1960-03-11 ? ?Transition of Care (TOC) CM/SW Contact  ?Beverly Sessions, RN ?Phone Number: ?05/28/2021, 10:41 AM ? ?Clinical Narrative:    ? ?Voicemails left for daughters [place] and [place] to clarify where patient will be residing at discharge  ? ?Update: received return call from [place].  She states that patient home is still available., it's just not in good condition.  She states "he has several options to go to at discharge.  We just aren't sure which one yet"  TOC to follow up closer to discharge to determine what address patient will be going to in order to notify [place]  ? ?Expected Discharge Plan: [place] ?Barriers to Discharge: Continued Medical Work up ? ?Expected Discharge Plan and Services ?Expected Discharge Plan: [place] ?  ?  ?  ?Living arrangements for the past 2 months: [place] ?                ?DME Arranged: Walker rolling ?DME Agency: [employer] ?Date DME Agency Contacted: 05/23/21 ?  ?Representative spoke with at DME Agency: Delana Meyer ?HH Arranged: PT ?[place] Agency: [place] ([place]) ?Date HH Agency Contacted: 05/23/21 ?  ?Representative spoke with at [place]: Corene Cornea ? ? ?Social Determinants of Health (SDOH) Interventions ?  ? ?Readmission Risk Interventions ? ?  05/23/2021  ?  3:37 PM  ?Readmission Risk Prevention Plan  ?Post Dischage Appt Complete  ?Medication Screening Complete  ?Transportation Screening Complete  ? ? ?

## 2021-05-28 NOTE — Assessment & Plan Note (Addendum)
PTH low at 7.  Suprisingly, with elevated calcium.  No history of thyroid surgeries or other thyroid pathology.  Thought to have a small palpable nodule on the left, but thyroid U/S on 3/25 was negative / unremarkable. ?Phos level normal. ? ?

## 2021-05-29 ENCOUNTER — Inpatient Hospital Stay: Payer: Medicaid Other

## 2021-05-29 DIAGNOSIS — J869 Pyothorax without fistula: Secondary | ICD-10-CM | POA: Diagnosis present

## 2021-05-29 LAB — CBC
HCT: 30.1 % — ABNORMAL LOW (ref 39.0–52.0)
Hemoglobin: 9.6 g/dL — ABNORMAL LOW (ref 13.0–17.0)
MCH: 25.5 pg — ABNORMAL LOW (ref 26.0–34.0)
MCHC: 31.9 g/dL (ref 30.0–36.0)
MCV: 79.8 fL — ABNORMAL LOW (ref 80.0–100.0)
Platelets: 717 10*3/uL — ABNORMAL HIGH (ref 150–400)
RBC: 3.77 MIL/uL — ABNORMAL LOW (ref 4.22–5.81)
RDW: 13.3 % (ref 11.5–15.5)
WBC: 16.6 10*3/uL — ABNORMAL HIGH (ref 4.0–10.5)
nRBC: 0 % (ref 0.0–0.2)

## 2021-05-29 LAB — PHOSPHORUS: Phosphorus: 2.9 mg/dL (ref 2.5–4.6)

## 2021-05-29 LAB — MAGNESIUM: Magnesium: 1.9 mg/dL (ref 1.7–2.4)

## 2021-05-29 LAB — CREATININE, SERUM
Creatinine, Ser: 0.88 mg/dL (ref 0.61–1.24)
GFR, Estimated: >60 mL/min (ref >60)

## 2021-05-29 LAB — ANGIOTENSIN CONVERTING ENZYME: Angiotensin-Converting Enzyme: 30 U/L (ref 14–82)

## 2021-05-29 MED ORDER — TRAMADOL HCL 50 MG PO TABS
100.0000 mg | ORAL_TABLET | Freq: Four times a day (QID) | ORAL | Status: DC | PRN
  Administered 2021-05-30: 100 mg via ORAL
  Filled 2021-05-29: qty 2

## 2021-05-29 MED ORDER — OXYCODONE HCL 5 MG PO TABS
5.0000 mg | ORAL_TABLET | Freq: Four times a day (QID) | ORAL | Status: DC | PRN
  Administered 2021-05-29: 5 mg via ORAL
  Filled 2021-05-29: qty 1

## 2021-05-29 MED ORDER — ALPRAZOLAM 0.25 MG PO TABS
0.2500 mg | ORAL_TABLET | Freq: Three times a day (TID) | ORAL | Status: DC | PRN
  Administered 2021-05-29 (×2): 0.25 mg via ORAL
  Filled 2021-05-29 (×3): qty 1

## 2021-05-29 MED ORDER — FENTANYL CITRATE PF 50 MCG/ML IJ SOSY: 12.5000 ug | PREFILLED_SYRINGE | INTRAMUSCULAR | Status: DC | PRN

## 2021-05-29 NOTE — Progress Notes (Signed)
?Progress Note ? ? ?Patient: Lucas Burke UYQ:[id number] DOB: 1960/07/10 DOA: 05/22/2021     7 ?DOS: the patient was seen and examined on 05/29/2021 ?  ?Brief hospital course: ?Lucas Burke is a 61 y.o. male with medical history significant for chronic back pain panic attacks, anxiety, tobacco use disorder, tremors, who presented to the hospital because of generalized weakness, poor oral intake and unintentional weight loss.  Reportedly, when EMS arrived, he was hypoxic with oxygen saturation of 80% on room air. ?  ?He was admitted to the hospital for sepsis secondary to pneumonia complicated by acute hypoxic respiratory failure.  Being treated with empiric IV antibiotics, IV fluids and oxygen via nasal cannula. ? ?Pt noted to have significant hypercalcemia with Ca 12.7 on admission, correcting to 14.2 when hypoalbuminemia taken into account.  Further evaluation of this is underway, but concern for underlying malignant process given weight loss over several months and intermittent fevers reported at home as well.   ? ?Work-up for hypercalcemia including chest CT revealed large left pleural effusion which appears loculated and with small air pockets.  IR consulted for thoracentesis, but due to the loculations and viscosity, unable to be performed.  IR instead placed a left-sided chest tube.  Fluid draining consistent with empyema. ? ?Assessment and Plan: ?* Pneumonia ?Severe sepsis -present on admission with leukocytosis, hypotension, altered mental status consistent with organ dysfunction, hypoxia with EMS. ?Started on Rocephin and Zithromax on admission. ?3/22 transitioned to cefepime due to ongoing fevers ?-- Continue cefepime ?--SLP evaluation (?  Aspiration) ?-- Antipyretics, antitussives mucolytic's, pulmonary hygiene ?-- Supplement O2 if needed to maintain sat above 90% ? ? ? ? ? ?Hypercalcemia ?Present on admission with corrected calcium of 14.2 (calcium 12.7, albumin 2.1). ?In the setting of recent weight  loss, recurrent fevers, long smoking history, concern for underlying malignant process. ? ?Labs: Both vitamin D-1,25 and vitamin D-25 are normal.  Ionized calcium elevated 6.8. ?PTH low at 7. ? ?CT chest with contrast for further evaluation: ?IMPRESSION: ?--Large left pleural effusion with loculations and possible small pockets of air. Possibility of empyema should be considered clinically. Thoracentesis as clinically warranted should be ?considered. ?--There is almost complete atelectasis of left lung with some residual aeration in the left apex. There is narrowing of left main bronchus in the left hilum. Possibility of central obstructing neoplastic ?process is not excluded. ?--There are abnormally enlarged lymph nodes in the mediastinum suggesting possible neoplastic process such as metastatic disease. ?  ?-- Follow-up pending PTHrP ?-- Further evaluation based on above ?-- Monitor off IV fluids ?-- Monitor BMP ? ?Protein-calorie malnutrition, severe ?Related to social and environmental circumstances (chronic poor appetite and poor oral intake), moderate to severe fat depletion, moderate to severe muscle depletion, percent weight loss. ?-- Appreciate dietitian recommendations ?-- Continue ensures, Magic cups and multivitamins ?-- Monitor closely for refeeding syndrome ? ?Hyperbilirubinemia ?Elevated bilirubin and alkaline phosphatase ?RUQ ultrasound showed gallbladder filled with sludge, no cholelithiasis or evidence of acute cholecystitis.  No evidence of bile duct obstruction.  Abnormal parenchymal echogenicity of the liver compared to the kidney differential considerations include acute hepatic edema such as due to hepatitis, versus abnormal kidney echogenicity due to chronic medical renal disease. ? ?3/22 T. bili 0.6, resolved ? ?Physical debility ?PT recommended home health and rolling walker which has been arranged. ? ?Inadequate oral intake ?Management as above under unintentional weight loss.  Off IV  hydration. Monitor. ? ?Unintentional weight loss ?Possibly related to underlying acute illness with  some concern for underlying malignant process given hypercalcemia and recurrent fevers despite antibiotics. ?Management as outlined. ?Dietitian following and patient started on supplements and vitamins. ? ? ?Thrombocytosis ?Secondary to acute illness ?Monitor CBC ? ? ?Iron deficiency ?Iron 14, TIBC 115, sat ratio 12, ferritin 637 (?acute phase reactant in setting of acute illness). ?-- Given concern for pneumonia, defer IV iron at this time ?-- Continue oral iron supplement ? ?Chronic back pain ?Pain control as needed. ?Mobility as tolerated ?Nonpharmacologic pain control ? ?Tremor ?chronic, stable ? ?Empyema of left pleural space (HCC) ?Chest tube placed 05/28/2021. ?Will discuss case in consultation with with cardiothoracic surgery at [facility]. ?Patient will likely require transfer. ? ?Hypoparathyroidism ([place]) ?PTH low at 7.  Suprisingly, with elevated calcium.  No history of thyroid surgeries or other thyroid pathology.  Does have small palpable nodule on the left. ?Last Phos level normal. ?-- Thyroid ultrasound pending ? ?Pleural effusion, left ?Noted on admission chest x-ray, further clarified by CT chest on 3/23 as part of work-up for hypercalcemia and concern for malignancy. ?Pleural effusion is loculated with small air pockets, see full report. ?-- Pulmonology consulted - follow up recs ?-- Thoracentesis attempted 3/24, unable due to thick appearance of fluid and loculations ?-- CT guided chest tube is planned ?-- Continue cefepime ? ? ? ? ?  ? ?Subjective: Patient awake resting in bed, visiting with daughter [place] at bedside when seen today.  He denies any significant pain at the site of chest tube.  In fact, says the pain he was having with coughing and sometimes deep inspiration has been improved since placement of the chest tube.  He reports overall feeling okay, no acute complaints. ? ?Physical  Exam: ?Vitals:  ? 05/28/21 1941 05/29/21 0437 05/29/21 0808 05/29/21 1545  ?BP: 97/65 96/62 100/70 101/72  ?Pulse: 75 71 69 71  ?Resp: 16 20 19 18   ?Temp: 97.9 ?F (36.6 ?C) 98.2 ?F (36.8 ?C) 98 ?F (36.7 ?C) 98.5 ?F (36.9 ?C)  ?TempSrc: Oral Oral Oral Oral  ?SpO2: 95% 95% 98% 95%  ?Weight:      ?Height:      ? ?General exam: awake, alert, no acute distress ?HEENT: moist mucus membranes, hearing grossly normal  ?Respiratory system: CTAB diminished on the left, left-sided chest tube in place no fluid currently in canister, no wheezes, normal respiratory effort on room air. ?Cardiovascular system: normal S1/S2, RRR, no JVD, murmurs, rubs, gallops, no pedal edema.   ?Central nervous system: A&O x4. no gross focal neurologic deficits, normal speech ?Extremities: moves all, no edema, normal tone ?Skin: dry, intact, normal temperature ?Psychiatry: normal mood, congruent affect, judgement and insight appear normal ? ? ?Data Reviewed: ? ?Labs reviewed and notable for white count improved to 16.6, hemoglobin relatively stable 9.6 , platelet 717 ? ?Family Communication: daughter [name] at bedside on rounds today. ? ?Disposition: ?Status is: Inpatient ?Remains inpatient appropriate because: Severity of illness with chest tube in place and ongoing management of empyema, remains on IV antibiotics with pending cultures ? ? Planned Discharge Destination: Home with Home Health ? ? ? ?Time spent: 40 minutes ? ?Author: ?Ezekiel Slocumb, DO ?05/29/2021 5:22 PM ? ?For on call review [email or web address].  ?

## 2021-05-29 NOTE — Assessment & Plan Note (Addendum)
Chest tube placed by IR 05/28/2021. ?Pulmonology recommending transfer to [name] for CT surgery evaluation discussed with Dr. Prescott Gum.  Recommended transfer, consult with CCM to attempt lytics, ultimately expect will need VATS. ?Follow pleural fluid culture. ? ? ?

## 2021-05-29 NOTE — Progress Notes (Signed)
? ? ? ?PULMONOLOGY ? ? ? ? ? ? ? ? ?Date: 05/29/2021,   ?MRN# [id number] Lucas Burke 12/17/1960 ? ? ?  ?AdmissionWeight: 72.6 kg                 ?CurrentWeight: 71.4 kg ? ?Referring provider: Dr. Griffith ? ? ?CHIEF COMPLAINT:  ? ?Loculated pleural effusion with febrile illness ? ? ?HISTORY OF PRESENT ILLNESS  ? ?This is a pleasant 60-year-old male with a history of anxiety disorder chronic lumbago lifelong smoking history came in with complaints of acute hypoxemic respiratory failure found to be hypoxic in the mid 80s on arrival.  Met sepsis criteria and was admitted for pneumonia and received IV fluids and IV antimicrobials.  Blood work notable for hypercalcemia.  CT chest was performed tonight reviewed this independently with findings as evidenced below in pictorial documentation section. ? ?05/28/21-patient is stable in bed  no overnight acute events. Reviewed plan for thoracentesis with IR however it seems patient may need chest tube for complex pleural effusion.  ? ?05/29/21- patient had chest tube placement. There is thick brownish bebris actively draining consistent with empyema. Patient is stable for now on room air. He may benefit from thoracic surgery consultation ? ? ?PAST MEDICAL HISTORY  ? ?Past Medical History:  ?Diagnosis Date  ?? Anxiety   ?? Chronic back pain   ?? Cigarette smoker   ?? Hypokalemia   ?? Panic attacks   ? ? ? ?SURGICAL HISTORY  ? ?Past Surgical History:  ?Procedure Laterality Date  ?? COLONOSCOPY  2010  ? ? ? ?FAMILY HISTORY  ? ?History reviewed. No pertinent family history. ? ? ?SOCIAL HISTORY  ? ?Social History  ? ?Tobacco Use  ?? Smoking status: Every Day  ?  Packs/day: 2.00  ?  Types: Cigarettes  ?? Smokeless tobacco: Never  ?Substance Use Topics  ?? Alcohol use: Not Currently  ? ? ? ?MEDICATIONS  ? ? ?Home Medication:  ?  ?Current Medication: ? ?Current Facility-Administered Medications:  ??  0.9 %  sodium chloride infusion, , Intravenous, PRN, Griffith, Kelly A, DO, Last Rate:  10 mL/hr at 05/29/21 0310, Infusion Verify at 05/29/21 0310 ??  acetaminophen (TYLENOL) tablet 1,000 mg, 1,000 mg, Oral, Q6H PRN, 1,000 mg at 05/29/21 0825 **OR** acetaminophen (TYLENOL) suppository 650 mg, 650 mg, Rectal, Q6H PRN, Morrison, Brenda, NP ??  ALPRAZolam (XANAX) tablet 0.25 mg, 0.25 mg, Oral, TID PRN, Griffith, Kelly A, DO, 0.25 mg at 05/29/21 0825 ??  alum & mag hydroxide-simeth (MAALOX/MYLANTA) 200-200-20 MG/5ML suspension 30 mL, 30 mL, Oral, Q4H PRN, Griffith, Kelly A, DO, 30 mL at 05/28/21 2241 ??  Ampicillin-Sulbactam (UNASYN) 3 g in sodium chloride 0.9 % 100 mL IVPB, 3 g, Intravenous, Q6H, Griffith, Kelly A, DO, Last Rate: 200 mL/hr at 05/29/21 1152, 3 g at 05/29/21 1152 ??  enoxaparin (LOVENOX) injection 40 mg, 40 mg, Subcutaneous, Q24H, Duncan, Hazel V, MD, 40 mg at 05/28/21 2108 ??  feeding supplement (ENSURE ENLIVE / ENSURE PLUS) liquid 237 mL, 237 mL, Oral, TID BM, Ayiku, Bernard, MD, 237 mL at 05/29/21 1332 ??  fentaNYL (SUBLIMAZE) injection 12.5 mcg, 12.5 mcg, Intravenous, Q2H PRN, Griffith, Kelly A, DO ??  ferrous sulfate tablet 325 mg, 325 mg, Oral, QODAY, Ayiku, Bernard, MD, 325 mg at 05/29/21 0825 ??  gabapentin (NEURONTIN) capsule 300 mg, 300 mg, Oral, BID, Ayiku, Bernard, MD, 300 mg at 05/29/21 0825 ??  lip balm (BLISTEX) ointment, , Topical, PRN, Griffith, Kelly A, DO, Given at 05/27/21   2211 ??  multivitamin with minerals tablet 1 tablet, 1 tablet, Oral, Daily, Jennye Boroughs, MD, 1 tablet at 05/29/21 0825 ??  ondansetron (ZOFRAN) tablet 4 mg, 4 mg, Oral, Q6H PRN **OR** ondansetron (ZOFRAN) injection 4 mg, 4 mg, Intravenous, Q6H PRN, Athena Masse, MD, 4 mg at 05/23/21 1238 ??  oxyCODONE (Oxy IR/ROXICODONE) immediate release tablet 5-10 mg, 5-10 mg, Oral, Q6H PRN, Nicole Kindred A, DO ??  PARoxetine (PAXIL) tablet 20 mg, 20 mg, Oral, Daily, Jennye Boroughs, MD, 20 mg at 05/29/21 0825 ??  traMADol (ULTRAM) tablet 100 mg, 100 mg, Oral, Q6H PRN, Nicole Kindred A, DO ? ? ? ?ALLERGIES   ? ?Codeine ? ? ? ? ?REVIEW OF SYSTEMS  ? ? ?Review of Systems: ? ?Gen:  Denies  fever, sweats, chills weigh loss  ?HEENT: Denies blurred vision, double vision, ear pain, eye pain, hearing loss, nose bleeds, sore throat ?Cardiac:  No dizziness, chest pain or heaviness, chest tightness,edema ?Resp:   reports dyspnea chronically  ?Gi: Denies swallowing difficulty, stomach pain, nausea or vomiting, diarrhea, constipation, bowel incontinence ?Gu:  Denies bladder incontinence, burning urine ?Ext:   Denies Joint pain, stiffness or swelling ?Skin: Denies  skin rash, easy bruising or bleeding or hives ?Endoc:  Denies polyuria, polydipsia , polyphagia or weight change ?Psych:   Denies depression, insomnia or hallucinations  ? ?Other:  All other systems negative ? ? ?VS: BP 100/70 (BP Location: Right Arm)   Pulse 69   Temp 98 ?F (36.7 ?C) (Oral)   Resp 19   Ht 6' (1.829 m)   Wt 71.4 kg   SpO2 98%   BMI 21.35 kg/m?   ? ? ? ?PHYSICAL EXAM  ? ? ?GENERAL:NAD, no fevers, chills, no weakness no fatigue ?HEAD: Normocephalic, atraumatic.  ?EYES: Pupils equal, round, reactive to light. Extraocular muscles intact. No scleral icterus.  ?MOUTH: Moist mucosal membrane. Dentition intact. No abscess noted.  ?EAR, NOSE, THROAT: Clear without exudates. No external lesions.  ?NECK: Supple. No thyromegaly. No nodules. No JVD.  ?PULMONARY: decreased breath sounds with mild rhonchi worse at bases bilaterally.  ?CARDIOVASCULAR: S1 and S2. Regular rate and rhythm. No murmurs, rubs, or gallops. No edema. Pedal pulses 2+ bilaterally.  ?GASTROINTESTINAL: Soft, nontender, nondistended. No masses. Positive bowel sounds. No hepatosplenomegaly.  ?MUSCULOSKELETAL: No swelling, clubbing, or edema. Range of motion full in all extremities.  ?NEUROLOGIC: Cranial nerves II through XII are intact. No gross focal neurological deficits. Sensation intact. Reflexes intact.  ?SKIN: No ulceration, lesions, rashes, or cyanosis. Skin warm and dry. Turgor intact.   ?PSYCHIATRIC: Mood, affect within normal limits. The patient is awake, alert and oriented x 3. Insight, judgment intact.  ? ? ?  ? ?IMAGING  ? ? ? ?CT chest reviewed independently by me 05/27/2021-significant enlargement of mediastinal and hilar lymphadenopathy complete atelectasis of the left lung with surrounding compressive atelectasis due to pleural effusion.  Difficult to exclude postobstructive process of the left lung ? ?ASSESSMENT/PLAN  ? ?Acute hypoxemic respiratory failure ?- present on admission -patient with left empyema ?- COVID19 negative ?- supplemental O2 during my evaluation room air ?- will perform infectious workup for pneumonia ?-Respiratory viral panel ?-serum fungitell ?-legionella ab ?-strep pneumoniae ur AG ?-Histoplasma Ur Ag ?-sputum resp cultures ?-AFB sputum expectorated specimen ?-sputum cytology  ?-reviewed pertinent imaging with patient today ?- ESR ?-PT/OT for d/c planning  ?-please encourage patient to use incentive spirometer few times each hour while hospitalized.   ?-on Unasyn IV now ? ?Left complete compressive atelectasis ?   -  Ordered diagnostic and therapeutic thoracentesis   ?-Appropriate testing has been ordered as well ?-reivewed with IR regarding chest tube ?-Patient is on blood thinners via subcu Lovenox 40 mg every 24 hours ? ? ?Moderate protein calorie malnutrition ?Bitemporal wasting with peripheral muscle weakness ?-Portends a poor prognosis ?Albumin is 1.6 ?RD nutritional evaluation ? ? ?Severe hypercalcemia ?Calcium is higher than reported due to low albumin levels ?-Recommend ionized calcium level ?Vitamin D testing ? ? ? ? ? ? ?Thank you for allowing me to participate in the care of this patient.  ? ?Patient/Family are satisfied with care plan and all questions have been answered.  ? ? ?Provider disclosure: ?Patient with at least one acute or chronic illness or injury that poses a threat to life or bodily function and is being managed actively during this  encounter.  All of the below services have been performed independently by signing provider:  review of prior documentation from internal and or external health records.  Review of previous and current lab results.  I

## 2021-05-30 ENCOUNTER — Ambulatory Visit: Admission: RE | Admit: 2021-05-30 | Payer: Medicaid Other | Source: Ambulatory Visit

## 2021-05-30 ENCOUNTER — Inpatient Hospital Stay (HOSPITAL_COMMUNITY)
Admission: AD | Admit: 2021-05-30 | Discharge: 2021-06-08 | DRG: 163 | Disposition: A | Discharge status: Home Health | Payer: Medicaid Other | Source: Other Acute Inpatient Hospital | Attending: Internal Medicine | Admitting: Internal Medicine

## 2021-05-30 DIAGNOSIS — R251 Tremor, unspecified: Secondary | ICD-10-CM | POA: Diagnosis present

## 2021-05-30 DIAGNOSIS — Z885 Allergy status to narcotic agent status: Secondary | ICD-10-CM | POA: Diagnosis not present

## 2021-05-30 DIAGNOSIS — Y92239 Unspecified place in hospital as the place of occurrence of the external cause: Secondary | ICD-10-CM | POA: Diagnosis not present

## 2021-05-30 DIAGNOSIS — J869 Pyothorax without fistula: Secondary | ICD-10-CM | POA: Diagnosis present

## 2021-05-30 DIAGNOSIS — G8929 Other chronic pain: Secondary | ICD-10-CM | POA: Diagnosis present

## 2021-05-30 DIAGNOSIS — E43 Unspecified severe protein-calorie malnutrition: Secondary | ICD-10-CM | POA: Diagnosis present

## 2021-05-30 DIAGNOSIS — J810 Acute pulmonary edema: Secondary | ICD-10-CM | POA: Diagnosis not present

## 2021-05-30 DIAGNOSIS — E209 Hypoparathyroidism, unspecified: Secondary | ICD-10-CM | POA: Diagnosis present

## 2021-05-30 DIAGNOSIS — E46 Unspecified protein-calorie malnutrition: Secondary | ICD-10-CM | POA: Diagnosis present

## 2021-05-30 DIAGNOSIS — M542 Cervicalgia: Secondary | ICD-10-CM | POA: Diagnosis present

## 2021-05-30 DIAGNOSIS — Z79899 Other long term (current) drug therapy: Secondary | ICD-10-CM | POA: Diagnosis not present

## 2021-05-30 DIAGNOSIS — D630 Anemia in neoplastic disease: Secondary | ICD-10-CM | POA: Diagnosis not present

## 2021-05-30 DIAGNOSIS — R5381 Other malaise: Secondary | ICD-10-CM | POA: Diagnosis present

## 2021-05-30 DIAGNOSIS — J90 Pleural effusion, not elsewhere classified: Secondary | ICD-10-CM | POA: Diagnosis not present

## 2021-05-30 DIAGNOSIS — T501X5A Adverse effect of loop [high-ceiling] diuretics, initial encounter: Secondary | ICD-10-CM | POA: Diagnosis not present

## 2021-05-30 DIAGNOSIS — C3492 Malignant neoplasm of unspecified part of left bronchus or lung: Secondary | ICD-10-CM | POA: Diagnosis present

## 2021-05-30 DIAGNOSIS — Z6822 Body mass index (BMI) 22.0-22.9, adult: Secondary | ICD-10-CM | POA: Diagnosis not present

## 2021-05-30 DIAGNOSIS — D62 Acute posthemorrhagic anemia: Secondary | ICD-10-CM | POA: Diagnosis not present

## 2021-05-30 DIAGNOSIS — G894 Chronic pain syndrome: Secondary | ICD-10-CM | POA: Diagnosis present

## 2021-05-30 DIAGNOSIS — D75839 Thrombocytosis, unspecified: Secondary | ICD-10-CM | POA: Diagnosis not present

## 2021-05-30 DIAGNOSIS — M549 Dorsalgia, unspecified: Secondary | ICD-10-CM | POA: Diagnosis present

## 2021-05-30 DIAGNOSIS — J910 Malignant pleural effusion: Secondary | ICD-10-CM | POA: Diagnosis present

## 2021-05-30 DIAGNOSIS — Z538 Procedure and treatment not carried out for other reasons: Secondary | ICD-10-CM | POA: Diagnosis not present

## 2021-05-30 DIAGNOSIS — C761 Malignant neoplasm of thorax: Secondary | ICD-10-CM | POA: Diagnosis not present

## 2021-05-30 DIAGNOSIS — I959 Hypotension, unspecified: Secondary | ICD-10-CM | POA: Diagnosis present

## 2021-05-30 DIAGNOSIS — J9811 Atelectasis: Secondary | ICD-10-CM | POA: Diagnosis present

## 2021-05-30 DIAGNOSIS — F17210 Nicotine dependence, cigarettes, uncomplicated: Secondary | ICD-10-CM | POA: Diagnosis present

## 2021-05-30 DIAGNOSIS — D75838 Other thrombocytosis: Secondary | ICD-10-CM | POA: Diagnosis present

## 2021-05-30 DIAGNOSIS — J189 Pneumonia, unspecified organism: Secondary | ICD-10-CM | POA: Diagnosis not present

## 2021-05-30 DIAGNOSIS — R64 Cachexia: Secondary | ICD-10-CM | POA: Diagnosis present

## 2021-05-30 DIAGNOSIS — F40240 Claustrophobia: Secondary | ICD-10-CM | POA: Diagnosis present

## 2021-05-30 DIAGNOSIS — E611 Iron deficiency: Secondary | ICD-10-CM | POA: Diagnosis present

## 2021-05-30 DIAGNOSIS — F419 Anxiety disorder, unspecified: Secondary | ICD-10-CM | POA: Diagnosis present

## 2021-05-30 DIAGNOSIS — J9601 Acute respiratory failure with hypoxia: Secondary | ICD-10-CM | POA: Diagnosis present

## 2021-05-30 DIAGNOSIS — E876 Hypokalemia: Secondary | ICD-10-CM | POA: Diagnosis not present

## 2021-05-30 DIAGNOSIS — M1990 Unspecified osteoarthritis, unspecified site: Secondary | ICD-10-CM | POA: Diagnosis not present

## 2021-05-30 LAB — BASIC METABOLIC PANEL
Anion gap: 5 (ref 5–15)
BUN: 20 mg/dL (ref 8–23)
CO2: 31 mmol/L (ref 22–32)
Calcium: 10.4 mg/dL — ABNORMAL HIGH (ref 8.9–10.3)
Chloride: 99 mmol/L (ref 98–111)
Creatinine, Ser: 1.02 mg/dL (ref 0.61–1.24)
GFR, Estimated: >60 mL/min (ref >60)
Glucose, Bld: 101 mg/dL — ABNORMAL HIGH (ref 70–99)
Potassium: 4.6 mmol/L (ref 3.5–5.1)
Sodium: 135 mmol/L (ref 135–145)

## 2021-05-30 LAB — CULTURE, BLOOD (ROUTINE X 2)
Culture: NO GROWTH
Culture: NO GROWTH
Special Requests: ADEQUATE
Special Requests: ADEQUATE

## 2021-05-30 LAB — CBC
HCT: 33 % — ABNORMAL LOW (ref 39.0–52.0)
Hemoglobin: 10.3 g/dL — ABNORMAL LOW (ref 13.0–17.0)
MCH: 25.4 pg — ABNORMAL LOW (ref 26.0–34.0)
MCHC: 31.2 g/dL (ref 30.0–36.0)
MCV: 81.5 fL (ref 80.0–100.0)
Platelets: 768 10*3/uL — ABNORMAL HIGH (ref 150–400)
RBC: 4.05 MIL/uL — ABNORMAL LOW (ref 4.22–5.81)
RDW: 13.3 % (ref 11.5–15.5)
WBC: 15.9 10*3/uL — ABNORMAL HIGH (ref 4.0–10.5)
nRBC: 0 % (ref 0.0–0.2)

## 2021-05-30 MED ORDER — ADULT MULTIVITAMIN W/MINERALS CH: 1.0000 | ORAL_TABLET | Freq: Every day | ORAL | Status: DC

## 2021-05-30 MED ORDER — OXYCODONE HCL 5 MG PO TABS
5.0000 mg | ORAL_TABLET | Freq: Four times a day (QID) | ORAL | Status: DC | PRN
  Administered 2021-05-30: 10 mg via ORAL
  Administered 2021-05-31: 5 mg via ORAL
  Administered 2021-05-31: 10 mg via ORAL
  Administered 2021-06-01 – 2021-06-02 (×3): 5 mg via ORAL
  Administered 2021-06-02 – 2021-06-05 (×4): 10 mg via ORAL
  Filled 2021-05-30 (×5): qty 2
  Filled 2021-05-30 (×4): qty 1
  Filled 2021-05-30: qty 2

## 2021-05-30 MED ORDER — OXYCODONE HCL 5 MG PO TABS
5.0000 mg | ORAL_TABLET | Freq: Four times a day (QID) | ORAL | 0 refills | Status: DC | PRN
Start: 2021-05-30 — End: 2021-06-08

## 2021-05-30 MED ORDER — PAROXETINE HCL 20 MG PO TABS
20.0000 mg | ORAL_TABLET | Freq: Every day | ORAL | Status: DC
  Administered 2021-05-31 – 2021-06-08 (×8): 20 mg via ORAL
  Filled 2021-05-30 (×8): qty 1

## 2021-05-30 MED ORDER — ALUM & MAG HYDROXIDE-SIMETH 200-200-20 MG/5ML PO SUSP
30.0000 mL | ORAL | Status: DC | PRN
  Administered 2021-06-07: 30 mL via ORAL
  Filled 2021-05-30: qty 30

## 2021-05-30 MED ORDER — SODIUM CHLORIDE 0.9 % IV SOLN: 3.0000 g | Freq: Four times a day (QID) | INTRAVENOUS | Status: DC

## 2021-05-30 MED ORDER — ACETAMINOPHEN 500 MG PO TABS
1000.0000 mg | ORAL_TABLET | Freq: Four times a day (QID) | ORAL | 0 refills | Status: DC | PRN
Start: 2021-05-30 — End: 2021-06-08

## 2021-05-30 MED ORDER — ONDANSETRON HCL 4 MG PO TABS: 4.0000 mg | ORAL_TABLET | Freq: Four times a day (QID) | ORAL | Status: DC | PRN

## 2021-05-30 MED ORDER — TRAMADOL HCL 50 MG PO TABS: 100.0000 mg | ORAL_TABLET | Freq: Four times a day (QID) | ORAL | Status: DC | PRN

## 2021-05-30 MED ORDER — ALUM & MAG HYDROXIDE-SIMETH 200-200-20 MG/5ML PO SUSP: 30.0000 mL | ORAL | 0 refills | Status: DC | PRN

## 2021-05-30 MED ORDER — ACETAMINOPHEN 650 MG RE SUPP
650.0000 mg | Freq: Four times a day (QID) | RECTAL | Status: DC | PRN
  Filled 2021-05-30: qty 1

## 2021-05-30 MED ORDER — ONDANSETRON HCL 4 MG PO TABS: 4.0000 mg | ORAL_TABLET | Freq: Four times a day (QID) | ORAL | 0 refills | Status: DC | PRN

## 2021-05-30 MED ORDER — ACETAMINOPHEN 325 MG PO TABS
650.0000 mg | ORAL_TABLET | Freq: Four times a day (QID) | ORAL | Status: DC | PRN
  Administered 2021-06-01 – 2021-06-07 (×3): 650 mg via ORAL
  Filled 2021-05-30 (×3): qty 2

## 2021-05-30 MED ORDER — POLYETHYLENE GLYCOL 3350 17 G PO PACK
17.0000 g | PACK | Freq: Every day | ORAL | Status: DC | PRN
  Administered 2021-06-04: 17 g via ORAL
  Filled 2021-05-30: qty 1

## 2021-05-30 MED ORDER — SODIUM CHLORIDE 0.9% FLUSH
3.0000 mL | Freq: Two times a day (BID) | INTRAVENOUS | Status: DC
  Administered 2021-05-30 – 2021-06-08 (×13): 3 mL via INTRAVENOUS

## 2021-05-30 MED ORDER — ALPRAZOLAM 0.25 MG PO TABS
0.2500 mg | ORAL_TABLET | Freq: Three times a day (TID) | ORAL | Status: DC | PRN
  Administered 2021-05-31 – 2021-06-07 (×5): 0.25 mg via ORAL
  Filled 2021-05-30 (×6): qty 1

## 2021-05-30 MED ORDER — ENOXAPARIN SODIUM 40 MG/0.4ML IJ SOSY
40.0000 mg | PREFILLED_SYRINGE | INTRAMUSCULAR | Status: DC
  Administered 2021-05-30 – 2021-05-31 (×2): 40 mg via SUBCUTANEOUS
  Filled 2021-05-30 (×2): qty 0.4

## 2021-05-30 MED ORDER — SODIUM CHLORIDE 0.9 % IV SOLN: 10.0000 mL | INTRAVENOUS | 0 refills | Status: DC | PRN

## 2021-05-30 MED ORDER — FENTANYL CITRATE PF 50 MCG/ML IJ SOSY: 12.5000 ug | PREFILLED_SYRINGE | INTRAMUSCULAR | Status: DC | PRN

## 2021-05-30 MED ORDER — ENSURE ENLIVE PO LIQD
237.0000 mL | Freq: Three times a day (TID) | ORAL | Status: DC
  Administered 2021-05-30 – 2021-06-08 (×20): 237 mL via ORAL

## 2021-05-30 MED ORDER — FENTANYL CITRATE PF 50 MCG/ML IJ SOSY
12.5000 ug | PREFILLED_SYRINGE | INTRAMUSCULAR | 0 refills | Status: DC | PRN
Start: 2021-05-30 — End: 2021-06-08

## 2021-05-30 MED ORDER — FERROUS SULFATE 325 (65 FE) MG PO TABS: 325.0000 mg | ORAL_TABLET | ORAL | 3 refills | Status: DC

## 2021-05-30 MED ORDER — ADULT MULTIVITAMIN W/MINERALS CH
1.0000 | ORAL_TABLET | Freq: Every day | ORAL | Status: DC
  Administered 2021-05-31 – 2021-06-08 (×7): 1 via ORAL
  Filled 2021-05-30 (×7): qty 1

## 2021-05-30 MED ORDER — ENOXAPARIN SODIUM 40 MG/0.4ML IJ SOSY: 40.0000 mg | PREFILLED_SYRINGE | INTRAMUSCULAR | Status: DC

## 2021-05-30 MED ORDER — ALPRAZOLAM 0.25 MG PO TABS: 0.2500 mg | ORAL_TABLET | Freq: Three times a day (TID) | ORAL | 0 refills | Status: DC | PRN

## 2021-05-30 MED ORDER — ENSURE ENLIVE PO LIQD: 237.0000 mL | Freq: Three times a day (TID) | ORAL | 12 refills | Status: DC

## 2021-05-30 MED ORDER — TRAMADOL HCL 50 MG PO TABS
100.0000 mg | ORAL_TABLET | Freq: Four times a day (QID) | ORAL | Status: DC | PRN
  Administered 2021-06-02 – 2021-06-06 (×2): 100 mg via ORAL
  Filled 2021-05-30 (×2): qty 2

## 2021-05-30 MED ORDER — SODIUM CHLORIDE 0.9 % IV SOLN
3.0000 g | Freq: Four times a day (QID) | INTRAVENOUS | Status: DC
  Administered 2021-05-30 – 2021-06-06 (×23): 3 g via INTRAVENOUS
  Filled 2021-05-30 (×27): qty 8

## 2021-05-30 NOTE — Discharge Summary (Addendum)
?Physician Discharge Summary ?  ?Patient: Lucas Burke MRN: [id number] DOB: 1960/08/24  ?Admit date:     05/22/2021  ?Discharge date: 05/30/21  ?Discharge Physician: Lucas Burke  ? ?[facility]P: [facility]  ? ?Recommendations at discharge:  ? ? Follow ups to be determined at time of discharge from [facility] ? ?Discharge Diagnoses: ?Principal Problem: ?  Pneumonia ?Active Problems: ?  Unintentional weight loss ?  Inadequate oral intake ?  Physical debility ?  Hyperbilirubinemia ?  Protein-calorie malnutrition, severe ?  Hypercalcemia ?  Pleural effusion, left ?  Thrombocytosis ?  Iron deficiency ?  Chronic back pain ?  Tremor ?  Hypoparathyroidism ([place]) ?  Empyema of left pleural space (HCC) ? ? ? ?Hospital Course: ?Lucas Burke is a 61 y.o. male with medical history significant for chronic back pain panic attacks, anxiety, tobacco use disorder, tremors, who presented to the hospital because of generalized weakness, poor oral intake and unintentional weight loss.  Reportedly, when EMS arrived, he was hypoxic with oxygen saturation of 80% on room air. ?  ?He was admitted to the hospital for sepsis secondary to pneumonia complicated by acute hypoxic respiratory failure.  Treated with empiric IV antibiotics, IV fluids and supplemental oxygen which is now weaned off.  Pt continued to spike fevers despite antibiotics.  Has a large empyema on left.  Chest tube placed by IR.  Now transferring to [name] for CCM and CT surgery to follow for attempt at lytic therapy, with expectation he may ultimately require VATS. ? ? ?Pt noted to have significant hypercalcemia,  with Ca on admission correcting to 14.2 when hypoalbuminemia taken into account.  Further evaluation of this is underway, but concern for underlying malignant process given weight loss over several months and intermittent fevers reported at home as well.   ? ?Assessment and Plan: ?* Pneumonia ?Severe sepsis -present on admission with  leukocytosis, hypotension, altered mental status consistent with organ dysfunction, hypoxia with EMS. ?Started on Rocephin and Zithromax on admission.  Transitioned to cefepime due to ongoing fevers.  Now on Unasyn for empyema. ?-- Continue Unasyn ?-- Incentive spirometer ?-- Antipyretics, antitussives mucolytic's, pulmonary hygiene ?-- Supplement O2 if needed to maintain sat above 90% ? ?Pleural effusion, left ?Noted on admission chest x-ray, further clarified by CT chest on 3/23 as part of work-up for hypercalcemia and concern for malignancy. ?Pleural effusion is loculated with small air pockets, see full report. ?-- Pulmonology consulted ?-- Thoracentesis attempted 3/24, unable due to viscosity of fluid and loculations ?-- CT guided chest tube placed 3/24 ?-- Continue Unasyn ? ?Hypercalcemia ?Present on admission with corrected calcium of 14.2 (calcium 12.7, albumin 2.1). ?In the setting of recent weight loss, recurrent fevers, long smoking history, concern for underlying malignant process. ?Treated with IV fluids and improved. ? ?Labs: Both vitamin D-1,25 and vitamin D-25 are normal.  Ionized calcium elevated 6.8. ?PTH low at 7. ? ?CT chest with: ?--Large left pleural effusion with loculations and possible small pockets of air. Possibility of empyema should be considered clinically... ?--Almost complete atelectasis of left lung with some residual aeration in the left apex. There is narrowing of left main bronchus in the left hilum. Possibility of central obstructing neoplastic process is not excluded. ?--There are abnormally enlarged lymph nodes in the mediastinum suggesting possible neoplastic process such as metastatic disease. ?  ?-- Follow-up pending PTHrP ?-- Further evaluation based on above ?-- Monitor off IV fluids, resume if needed ?-- Monitor BMP ? ?  Protein-calorie malnutrition, severe ?Related to social and environmental circumstances (chronic poor appetite and poor oral intake), moderate to severe  fat depletion, moderate to severe muscle depletion, percent weight loss. ?-- Appreciate dietitian recommendations ?-- Continue ensures, Magic cups and multivitamins ?-- Monitor closely for refeeding syndrome ? ?Hyperbilirubinemia ?Elevated bilirubin and alkaline phosphatase ?RUQ ultrasound showed gallbladder filled with sludge, no cholelithiasis or evidence of acute cholecystitis.  No evidence of bile duct obstruction.  Abnormal parenchymal echogenicity of the liver compared to the kidney differential considerations include acute hepatic edema such as due to hepatitis, versus abnormal kidney echogenicity due to chronic medical renal disease. ? ?3/22 T. bili 0.6, resolved ? ?Physical debility ?PT recommended home health and rolling walker which has been arranged. ? ?Inadequate oral intake ?Management as above under unintentional weight loss.  Off IV hydration.  ?Pt reports appetite significantly improved. ?--Appreciate dietitian recommendations ? ?Unintentional weight loss ?Possibly related to underlying acute illness with some concern for underlying malignant process given hypercalcemia. ?Management as outlined. ?Dietitian following and patient started on supplements and vitamins. ? ? ?Thrombocytosis ?Secondary to acute illness ?Monitor CBC ? ? ?Iron deficiency ?Iron 14, TIBC 115, sat ratio 12, ferritin 637 (?acute phase reactant in setting of acute illness). ?-- Continue oral iron supplement ? ?Chronic back pain ?Pain control as needed. ?Mobility as tolerated ?Nonpharmacologic pain control ? ?Tremor ?chronic, stable ? ?Empyema of left pleural space (HCC) ?Chest tube placed by IR 05/28/2021. ?Pulmonology recommending transfer to [name] for CT surgery evaluation discussed with Dr. Prescott Gum.  Recommended transfer, consult with CCM to attempt lytics, ultimately expect will need VATS. ?Follow pleural fluid culture. ? ? ? ?Hypoparathyroidism ([place]) ?PTH low at 7.  Suprisingly, with elevated calcium.  No history of  thyroid surgeries or other thyroid pathology.  Thought to have a small palpable nodule on the left, but thyroid U/S on 3/25 was negative / unremarkable. ?Phos level normal. ? ? ? ? ? ?  ? ?Family communication: Updated daughter, [name], by phone this afternoon on plan to transfer to [name] for CT surgery and potentially VATS.  She is agreeable.   ?Please continue to update her regularly. ?Of note, [name] is a travel nurse works in trauma. ? ? ?Consultants: Pulmonology ?Procedures performed: Chest Tube placement ?  ?Disposition:  Transfer to [name] ? ? ?Diet recommendation:  ?Regular diet ?DISCHARGE MEDICATION: ?Allergies as of 05/30/2021   ? ?   Reactions  ? Codeine Other (See Comments)  ? Critical  ? ?  ? ?  ?Medication List  ?  ? ?TAKE these medications   ? ?acetaminophen 325 MG tablet ?Commonly known as: TYLENOL ?Take 650 mg by mouth every 6 (six) hours as needed. ?What changed: Another medication with the same name was added. Make sure you understand how and when to take each. ?  ?acetaminophen 500 MG tablet ?Commonly known as: TYLENOL ?Take 2 tablets (1,000 mg total) by mouth every 6 (six) hours as needed for mild pain (or Fever >/= 101). ?What changed: You were already taking a medication with the same name, and this prescription was added. Make sure you understand how and when to take each. ?  ?ALPRAZolam 0.25 MG tablet ?Commonly known as: Duanne Moron ?Take 1 tablet (0.25 mg total) by mouth 3 (three) times daily as needed for anxiety. ?  ?alum & mag hydroxide-simeth 200-200-20 MG/5ML suspension ?Commonly known as: MAALOX/MYLANTA ?Take 30 mLs by mouth every 4 (four) hours as needed for indigestion. ?  ?Ampicillin-Sulbactam 3 g  in sodium chloride 0.9 % 100 mL ?Inject 3 g into the vein every 6 (six) hours. ?  ?enoxaparin 40 MG/0.4ML injection ?Commonly known as: LOVENOX ?Inject 0.4 mLs (40 mg total) into the skin daily. ?  ?feeding supplement Liqd ?Take 237 mLs by mouth 3 (three) times daily between  meals. ?  ?fentaNYL 50 MCG/ML injection ?Commonly known as: SUBLIMAZE ?Inject 0.25 mLs (12.5 mcg total) into the vein every 2 (two) hours as needed (breakthrough pain despite oral meds). ?  ?ferrous sulfate 325 (65

## 2021-05-30 NOTE — Plan of Care (Signed)
°  Problem: Education: °Goal: Knowledge of General Education information will improve °Description: Including pain rating scale, medication(s)/side effects and non-pharmacologic comfort measures °Outcome: Progressing °  °Problem: Health Behavior/Discharge Planning: °Goal: Ability to manage health-related needs will improve °Outcome: Progressing °  °Problem: Clinical Measurements: °Goal: Ability to maintain clinical measurements within normal limits will improve °Outcome: Progressing °Goal: Will remain free from infection °Outcome: Progressing °Goal: Diagnostic test results will improve °Outcome: Progressing °Goal: Respiratory complications will improve °Outcome: Progressing °Goal: Cardiovascular complication will be avoided °Outcome: Progressing °  °Problem: Activity: °Goal: Risk for activity intolerance will decrease °Outcome: Progressing °  °Problem: Coping: °Goal: Level of anxiety will decrease °Outcome: Progressing °  °Problem: Elimination: °Goal: Will not experience complications related to bowel motility °Outcome: Progressing °  °Problem: Pain Managment: °Goal: General experience of comfort will improve °Outcome: Progressing °  °Problem: Safety: °Goal: Ability to remain free from injury will improve °Outcome: Progressing °  °Problem: Skin Integrity: °Goal: Risk for impaired skin integrity will decrease °Outcome: Progressing °  °

## 2021-05-30 NOTE — Progress Notes (Signed)
Received word that patient wold be transferring to [facility] to [place]. Report was called to Oleta Mouse, RN ?

## 2021-05-30 NOTE — Progress Notes (Signed)
Mobility Specialist - Progress Note ? ? 05/30/21 1500  ?Mobility  ?Range of Motion/Exercises Right leg;Left leg  ?Level of Assistance Modified independent, requires aide device or extra time  ?Assistive Device None  ?Distance Ambulated (ft) 0 ft  ?Activity Response Tolerated well  ?$Mobility charge 1 Mobility  ? ? ? ?Pt supine upon arrival using RA. Emotional about family issues pertaining to his children, Pryor Curia provides education on the importance of focusing on his personal health.Pt agreeable to BLE Therex with ModI + vc for proper execution. Pt voices no complaints and is left with needs in reach and family and NT at bedside. ? ?Merrily Brittle ?Mobility Specialist ?05/30/21, 3:52 PM ? ? ? ?

## 2021-05-30 NOTE — TOC Progression Note (Addendum)
Transition of Care (TOC) - Progression Note  ? ? ?Patient Details  ?Name: Lucas Burke ?MRN: [id number] ?Date of Birth: [birth year] ? ?Transition of Care (TOC) CM/SW Contact  ?Kenon Delashmit E Nikie Cid, LCSW ?Phone Number: ?05/30/2021, 3:51 PM ? ?Clinical Narrative:    ?Patient being transferred to [facility]  ?[facility] notified.  ?TOC at [facility] to continue to follow. ? ? ?Expected Discharge Plan: [place] ?Barriers to Discharge: Continued Medical Work up ? ?Expected Discharge Plan and Services ?Expected Discharge Plan: [place] ?  ?  ?  ?Living arrangements for the past 2 months: [place] ?Expected Discharge Date: 05/30/21               ?DME Arranged: [place] ?DME Agency: [employer] ?Date DME Agency Contacted: 05/23/21 ?  ?Representative spoke with at DME Agency: Delana Meyer ?HH Arranged: PT ?[place] Agency: [facility] ?Date HH Agency Contacted: 05/23/21 ?  ?Representative spoke with at [place]: Corene Cornea ? ? ?Social Determinants of Health (SDOH) Interventions ?  ? ?Readmission Risk Interventions ? ?  05/23/2021  ?  3:37 PM  ?Readmission Risk Prevention Plan  ?Post Dischage Appt Complete  ?Medication Screening Complete  ?Transportation Screening Complete  ? ? ?

## 2021-05-30 NOTE — H&P (Signed)
?History and Physical  ? ?Lucas Burke DOB: [birth year] DOA: 05/30/2021 ? ?PCP: [facility] [place]  ? ?Patient coming from: [facility]) ? ?Chief Complaint: Initially presented with weakness, transferred for empyema ? ?HPI and Hospital course: Lucas Burke is a 61 y.o. male with medical history significant of chronic pain, panic attacks, anxiety, tremors presenting as transfer for evaluation by CT surgery for empyema.  Initially presented with generalized weakness. ? ?Patient presented to [facility] with generalized weakness on March 18.  He was admitted with sepsis secondary to pneumonia which was complicated by respiratory failure with hypoxia.  He was treated with antibiotics, IV fluids and supplemental oxygen.  Has been weaned off supplemental oxygen.  He continued to spike a fever and was found to have a large left empyema.  Patient evaluated by pulmonology at [facility].  Chest tube placed by IR.  Pulmonology recommended CT surgery evaluation for possible VATS.  Patient excepted for transfer to [facility].  CT surgeon, Dr. Darcey Nora recommended critical care to see patient to attempt lytics first but expect patient will likely need VATS. ? ?Today, patient continues to have mucopurulent drainage from his chest tube.  Has some mild pain at the tube site. ? ?Labs today showed BMP significant only for mildly elevated calcium at 10.4.  CBC with hemoglobin 10.3 which is stable, thrombocytosis to 768 and leukocytosis to 15.9 which is downtrending.  Vital signs stable today. ? ?Review of Systems: As per HPI otherwise all other systems reviewed and are negative. ? ?Past Medical History:  ?Diagnosis Date  ? Anxiety   ? Chronic back pain   ? Cigarette smoker   ? Hypokalemia   ? Panic attacks   ? ?Past Surgical History:  ?Procedure Laterality Date  ? COLONOSCOPY  2010  ? ?Social History ? reports that he has been smoking. He has been smoking an average of 2 packs per day. He has never used  smokeless tobacco. He reports that he does not currently use alcohol. No history on file for drug use. ? ?Allergies  ?Allergen Reactions  ? Codeine Other (See Comments)  ?  Critical  ? ?No family history on file. ? ?Prior to Admission medications   ?Medication Sig Start Date End Date Taking? Authorizing Provider  ?acetaminophen (TYLENOL) 325 MG tablet Take 650 mg by mouth every 6 (six) hours as needed.    [provider]  ?acetaminophen (TYLENOL) 500 MG tablet Take 2 tablets (1,000 mg total) by mouth every 6 (six) hours as needed for mild pain (or Fever >/= 101). 05/30/21   Ezekiel Slocumb, DO  ?ALPRAZolam (XANAX) 0.25 MG tablet Take 1 tablet (0.25 mg total) by mouth 3 (three) times daily as needed for anxiety. 05/30/21   Ezekiel Slocumb, DO  ?alum & mag hydroxide-simeth (MAALOX/MYLANTA) 200-200-20 MG/5ML suspension Take 30 mLs by mouth every 4 (four) hours as needed for indigestion. 05/30/21   Ezekiel Slocumb, DO  ?Ampicillin-Sulbactam 3 g in sodium chloride 0.9 % 100 mL Inject 3 g into the vein every 6 (six) hours. 05/30/21   Ezekiel Slocumb, DO  ?enoxaparin (LOVENOX) 40 MG/0.4ML injection Inject 0.4 mLs (40 mg total) into the skin daily. 05/30/21   Ezekiel Slocumb, DO  ?feeding supplement (ENSURE ENLIVE / ENSURE PLUS) LIQD Take 237 mLs by mouth 3 (three) times daily between meals. 05/30/21   Ezekiel Slocumb, DO  ?fentaNYL (SUBLIMAZE) 50 MCG/ML injection Inject 0.25 mLs (12.5 mcg total) into the vein every  2 (two) hours as needed (breakthrough pain despite oral meds). 05/30/21   Ezekiel Slocumb, DO  ?ferrous sulfate 325 (65 FE) MG tablet Take 1 tablet (325 mg total) by mouth every other day. 05/31/21   Ezekiel Slocumb, DO  ?gabapentin (NEURONTIN) 300 MG capsule Take 300 mg by mouth 2 (two) times daily.    [provider]  ?ibuprofen (ADVIL) 800 MG tablet Take 800 mg by mouth every 8 (eight) hours as needed. 04/17/21   [provider]  ?Multiple Vitamin (MULTIVITAMIN WITH MINERALS)  TABS tablet Take 1 tablet by mouth daily. 05/31/21   Ezekiel Slocumb, DO  ?ondansetron (ZOFRAN) 4 MG tablet Take 1 tablet (4 mg total) by mouth every 6 (six) hours as needed for nausea. 05/30/21   Ezekiel Slocumb, DO  ?oxyCODONE (OXY IR/ROXICODONE) 5 MG immediate release tablet Take 1-2 tablets (5-10 mg total) by mouth every 6 (six) hours as needed for severe pain. 05/30/21   Ezekiel Slocumb, DO  ?PARoxetine (PAXIL) 20 MG tablet Take 1 tablet by mouth daily. 09/09/20   [provider]  ?sodium chloride 0.9 % infusion Inject 10 mLs into the vein as needed (for administration of IV medications (carrier fluid)). 05/30/21   Ezekiel Slocumb, DO  ?traMADol (ULTRAM) 50 MG tablet Take 2 tablets (100 mg total) by mouth every 6 (six) hours as needed for moderate pain. 05/30/21   Ezekiel Slocumb, DO  ? ? ?Physical Exam: ?Vitals:  ? 05/30/21 2005  ?BP: 107/73  ?Pulse: 76  ?Temp: 98.1 ?F (36.7 ?C)  ?TempSrc: Oral  ?SpO2: 90%  ? ? ?Physical Exam ?Constitutional:   ?   General: He is not in acute distress. ?   Appearance: Normal appearance.  ?HENT:  ?   Head: Normocephalic and atraumatic.  ?   Mouth/Throat:  ?   Mouth: Mucous membranes are moist.  ?   Pharynx: Oropharynx is clear.  ?Eyes:  ?   Extraocular Movements: Extraocular movements intact.  ?   Pupils: Pupils are equal, round, and reactive to light.  ?Cardiovascular:  ?   Rate and Rhythm: Normal rate and regular rhythm.  ?   Pulses: Normal pulses.  ?   Heart sounds: Normal heart sounds.  ?Pulmonary:  ?   Effort: Pulmonary effort is normal. No respiratory distress.  ?   Comments: Chest tube in place on left.  Absent breath sounds on left. ?Abdominal:  ?   General: Bowel sounds are normal. There is no distension.  ?   Palpations: Abdomen is soft.  ?   Tenderness: There is no abdominal tenderness.  ?Musculoskeletal:     ?   General: No swelling or deformity.  ?Skin: ?   General: Skin is warm and dry.  ?Neurological:  ?   General: No focal deficit present.  ?    Mental Status: Mental status is at baseline.  ? ?Labs on Admission: I have personally reviewed following labs and imaging studies ? ?CBC: ?Recent Labs  ?Lab 05/24/21 ?0432 05/25/21 ?0441 05/26/21 ?0436 05/27/21 ?0429 05/28/21 ?0400 05/29/21 ?6659 05/30/21 ?0433  ?WBC 17.2* 18.0* 22.0* 21.3* 22.0* 16.6* 15.9*  ?NEUTROABS 13.9* 14.8* 18.2*  --   --   --   --   ?HGB 11.6* 11.1* 9.9* 9.8* 10.1* 9.6* 10.3*  ?HCT 38.1* 35.8* 31.5* 30.4* 32.0* 30.1* 33.0*  ?MCV 83.6 82.3 81.4 80.6 81.0 79.8* 81.5  ?PLT 681* 729* 683* 659* 702* 717* 768*  ? ? ?Basic Metabolic Panel: ?Recent Labs  ?  Lab 05/24/21 ?0432 05/25/21 ?0441 05/27/21 ?0429 05/27/21 ?2017 05/28/21 ?0423 05/29/21 ?4696 05/30/21 ?0433  ?NA 137 136 133* 133* 133*  --  135  ?K 4.7 4.2 4.2 4.2 4.3  --  4.6  ?CL 102 102 99 99 99  --  99  ?CO2 27 31 28 27 27   --  31  ?GLUCOSE 88 109* 110* 116* 102*  --  101*  ?BUN 29* 24* 19 20 20   --  20  ?CREATININE 0.99 1.03 0.93 0.98 1.00 0.88 1.02  ?CALCIUM 11.9* 11.2* 10.0 9.9 10.0  --  10.4*  ?MG 2.1  --  1.8  --   --  1.9  --   ?PHOS  --   --  2.6  --   --  2.9  --   ? ? ?GFR: ?Estimated Creatinine Clearance: 75.8 mL/min (by C-G formula based on SCr of 1.02 mg/dL). ? ?Liver Function Tests: ?Recent Labs  ?Lab 05/25/21 ?0441 05/27/21 ?0429 05/28/21 ?0400  ?AST 21 49*  --   ?ALT 19 39  --   ?ALKPHOS 194* 189*  --   ?BILITOT 0.6 0.6  --   ?PROT 5.7* 5.8*  --   ?ALBUMIN 1.7* 1.6* 1.7*  ? ? ?Urine analysis: ?   ?Component Value Date/Time  ? COLORURINE AMBER (A) 05/22/2021 1846  ? APPEARANCEUR CLEAR (A) 05/22/2021 1846  ? LABSPEC 1.024 05/22/2021 1846  ? PHURINE 5.0 05/22/2021 1846  ? [place] NEGATIVE 05/22/2021 1846  ? [place] NEGATIVE 05/22/2021 1846  ? [place] NEGATIVE 05/22/2021 1846  ? [place] NEGATIVE 05/22/2021 1846  ? [place] NEGATIVE 05/22/2021 1846  ? NITRITE NEGATIVE 05/22/2021 1846  ? LEUKOCYTESUR NEGATIVE 05/22/2021 1846  ? ? ?Radiological Exams on Admission: ?US THYROID ? ?Result Date: 05/29/2021 ?CLINICAL DATA:  Palpable  abnormality. EXAM: THYROID ULTRASOUND TECHNIQUE: Ultrasound examination of the thyroid gland and adjacent soft tissues was performed. COMPARISON:  None. FINDINGS: Parenchymal Echotexture: Mildly heterogenous Is

## 2021-05-30 NOTE — Progress Notes (Signed)
? ? ? ?PULMONOLOGY ? ? ? ? ? ? ? ? ?Date: 05/30/2021,   ?MRN# [id number] Lucas Burke October 11, 1960 ? ? ?  ?AdmissionWeight: 72.6 kg                 ?CurrentWeight: 71.4 kg ? ?Referring provider: Dr. Arbutus Ped ? ? ?CHIEF COMPLAINT:  ? ?Loculated pleural effusion with febrile illness ? ? ?HISTORY OF PRESENT ILLNESS  ? ?This is a pleasant 61 year old male with a history of anxiety disorder chronic lumbago lifelong smoking history came in with complaints of acute hypoxemic respiratory failure found to be hypoxic in the mid 80s on arrival.  Met sepsis criteria and was admitted for pneumonia and received IV fluids and IV antimicrobials.  Blood work notable for hypercalcemia.  CT chest was performed tonight reviewed this independently with findings as evidenced below in pictorial documentation section. ? ?05/28/21-patient is stable in bed  no overnight acute events. Reviewed plan for thoracentesis with IR however it seems patient may need chest tube for complex pleural effusion.  ? ?05/29/21- patient had chest tube placement. There is thick brownish bebris actively draining consistent with empyema. Patient is stable for now on room air. He may benefit from thoracic surgery consultation ? ?05/30/21- patient stable no acute events. Still draining mucopurulent debris actively. Complains of mild CP at chest tube insertion site unchanged from prior. He has no air leak on chest tube evaluation. WBC count trending down consistently.  Discussed thoracic surgery evaluation yesterday with attending physician Dr Arbutus Ped.  Patient is stable no emergency a this time.  ? ?PAST MEDICAL HISTORY  ? ?Past Medical History:  ?Diagnosis Date  ?? Anxiety   ?? Chronic back pain   ?? Cigarette smoker   ?? Hypokalemia   ?? Panic attacks   ? ? ? ?SURGICAL HISTORY  ? ?Past Surgical History:  ?Procedure Laterality Date  ?? COLONOSCOPY  2010  ? ? ? ?FAMILY HISTORY  ? ?History reviewed. No pertinent family history. ? ? ?SOCIAL HISTORY  ? ?Social History   ? ?Tobacco Use  ?? Smoking status: Every Day  ?  Packs/day: 2.00  ?  Types: Cigarettes  ?? Smokeless tobacco: Never  ?Substance Use Topics  ?? Alcohol use: Not Currently  ? ? ? ?MEDICATIONS  ? ? ?Home Medication:  ?  ?Current Medication: ? ?Current Facility-Administered Medications:  ??  0.9 %  sodium chloride infusion, , Intravenous, PRN, Ezekiel Slocumb, DO, Stopped at 05/30/21 0603 ??  acetaminophen (TYLENOL) tablet 1,000 mg, 1,000 mg, Oral, Q6H PRN, 1,000 mg at 05/30/21 0600 **OR** acetaminophen (TYLENOL) suppository 650 mg, 650 mg, Rectal, Q6H PRN, Sharion Settler, NP ??  ALPRAZolam Duanne Moron) tablet 0.25 mg, 0.25 mg, Oral, TID PRN, Nicole Kindred A, DO, 0.25 mg at 05/29/21 2039 ??  alum & mag hydroxide-simeth (MAALOX/MYLANTA) 200-200-20 MG/5ML suspension 30 mL, 30 mL, Oral, Q4H PRN, Nicole Kindred A, DO, 30 mL at 05/28/21 2241 ??  Ampicillin-Sulbactam (UNASYN) 3 g in sodium chloride 0.9 % 100 mL IVPB, 3 g, Intravenous, Q6H, Griffith, Kelly A, DO, Last Rate: 200 mL/hr at 05/30/21 1711, 3 g at 05/30/21 1711 ??  enoxaparin (LOVENOX) injection 40 mg, 40 mg, Subcutaneous, Q24H, Judd Gaudier V, MD, 40 mg at 05/29/21 2040 ??  feeding supplement (ENSURE ENLIVE / ENSURE PLUS) liquid 237 mL, 237 mL, Oral, TID BM, Jennye Boroughs, MD, 237 mL at 05/30/21 1329 ??  fentaNYL (SUBLIMAZE) injection 12.5 mcg, 12.5 mcg, Intravenous, Q2H PRN, Nicole Kindred A, DO ??  ferrous sulfate  tablet 325 mg, 325 mg, Oral, Tonye Pearson, MD, 325 mg at 05/29/21 0825 ??  gabapentin (NEURONTIN) capsule 300 mg, 300 mg, Oral, BID, Jennye Boroughs, MD, 300 mg at 05/30/21 8144 ??  lip balm (BLISTEX) ointment, , Topical, PRN, Ezekiel Slocumb, DO, Given at 05/27/21 2211 ??  multivitamin with minerals tablet 1 tablet, 1 tablet, Oral, Daily, Jennye Boroughs, MD, 1 tablet at 05/30/21 0811 ??  ondansetron (ZOFRAN) tablet 4 mg, 4 mg, Oral, Q6H PRN **OR** ondansetron (ZOFRAN) injection 4 mg, 4 mg, Intravenous, Q6H PRN, Athena Masse, MD, 4 mg  at 05/23/21 1238 ??  oxyCODONE (Oxy IR/ROXICODONE) immediate release tablet 5-10 mg, 5-10 mg, Oral, Q6H PRN, Nicole Kindred A, DO, 5 mg at 05/29/21 2051 ??  PARoxetine (PAXIL) tablet 20 mg, 20 mg, Oral, Daily, Jennye Boroughs, MD, 20 mg at 05/30/21 0810 ??  traMADol (ULTRAM) tablet 100 mg, 100 mg, Oral, Q6H PRN, Nicole Kindred A, DO, 100 mg at 05/30/21 1709 ? ? ? ?ALLERGIES  ? ?Codeine ? ? ? ? ?REVIEW OF SYSTEMS  ? ? ?Review of Systems: ? ?Gen:  Denies  fever, sweats, chills weigh loss  ?HEENT: Denies blurred vision, double vision, ear pain, eye pain, hearing loss, nose bleeds, sore throat ?Cardiac:  No dizziness, chest pain or heaviness, chest tightness,edema ?Resp:   reports dyspnea chronically  ?Gi: Denies swallowing difficulty, stomach pain, nausea or vomiting, diarrhea, constipation, bowel incontinence ?Gu:  Denies bladder incontinence, burning urine ?Ext:   Denies Joint pain, stiffness or swelling ?Skin: Denies  skin rash, easy bruising or bleeding or hives ?Endoc:  Denies polyuria, polydipsia , polyphagia or weight change ?Psych:   Denies depression, insomnia or hallucinations  ? ?Other:  All other systems negative ? ? ?VS: BP 112/74 (BP Location: Right Arm)   Pulse 80   Temp 98.6 ?F (37 ?C) (Oral)   Resp 18   Ht 6' (1.829 m)   Wt 71.4 kg   SpO2 95%   BMI 21.35 kg/m?   ? ? ? ?PHYSICAL EXAM  ? ? ?GENERAL:NAD, no fevers, chills, no weakness no fatigue ?HEAD: Normocephalic, atraumatic.  ?EYES: Pupils equal, round, reactive to light. Extraocular muscles intact. No scleral icterus.  ?MOUTH: Moist mucosal membrane. Dentition intact. No abscess noted.  ?EAR, NOSE, THROAT: Clear without exudates. No external lesions.  ?NECK: Supple. No thyromegaly. No nodules. No JVD.  ?PULMONARY: decreased breath sounds with mild rhonchi worse at bases bilaterally.  ?CARDIOVASCULAR: S1 and S2. Regular rate and rhythm. No murmurs, rubs, or gallops. No edema. Pedal pulses 2+ bilaterally.  ?GASTROINTESTINAL: Soft, nontender,  nondistended. No masses. Positive bowel sounds. No hepatosplenomegaly.  ?MUSCULOSKELETAL: No swelling, clubbing, or edema. Range of motion full in all extremities.  ?NEUROLOGIC: Cranial nerves II through XII are intact. No gross focal neurological deficits. Sensation intact. Reflexes intact.  ?SKIN: No ulceration, lesions, rashes, or cyanosis. Skin warm and dry. Turgor intact.  ?PSYCHIATRIC: Mood, affect within normal limits. The patient is awake, alert and oriented x 3. Insight, judgment intact.  ? ? ?  ? ?IMAGING  ? ? ? ?CT chest reviewed independently by me 05/27/2021-significant enlargement of mediastinal and hilar lymphadenopathy complete atelectasis of the left lung with surrounding compressive atelectasis due to pleural effusion.  Difficult to exclude postobstructive process of the left lung ? ?ASSESSMENT/PLAN  ? ?Acute hypoxemic respiratory failure ?- present on admission -patient with left empyema ?- COVID19 negative ?- supplemental O2 during my evaluation room air ?- will perform infectious workup for pneumonia ?-Respiratory  viral panel ?-serum fungitell ?-legionella ab ?-strep pneumoniae ur AG ?-Histoplasma Ur Ag ?-sputum resp cultures ?-AFB sputum expectorated specimen ?-sputum cytology  ?-reviewed pertinent imaging with patient today ?- ESR ?-PT/OT for d/c planning  ?-please encourage patient to use incentive spirometer few times each hour while hospitalized.   ?-on Unasyn IV now ? ?Left complete compressive atelectasis ?   -s/p chest tube with active pus draining and patient is on room air now.  ? ? ?Moderate protein calorie malnutrition ?Bitemporal wasting with peripheral muscle weakness ?-Portends a poor prognosis ?Albumin is 1.6 ?RD nutritional evaluation ? ? ?Severe hypercalcemia ?Calcium is higher than reported due to low albumin levels ?-Recommend ionized calcium level ?Vitamin D testing ? ? ? ? ? ? ?Thank you for allowing me to participate in the care of this patient.  ? ?Patient/Family are  satisfied with care plan and all questions have been answered.  ? ? ?Provider disclosure: ?Patient with at least one acute or chronic illness or injury that poses a threat to life or bodily function and is being [place]

## 2021-05-31 ENCOUNTER — Inpatient Hospital Stay (HOSPITAL_COMMUNITY): Payer: Medicaid Other

## 2021-05-31 DIAGNOSIS — J869 Pyothorax without fistula: Secondary | ICD-10-CM

## 2021-05-31 LAB — BLOOD GAS, ARTERIAL
Acid-Base Excess: 7.4 mmol/L — ABNORMAL HIGH (ref 0.0–2.0)
Bicarbonate: 32 mmol/L — ABNORMAL HIGH (ref 20.0–28.0)
O2 Saturation: 97.5 %
Patient temperature: 37
pCO2 arterial: 44 mmHg (ref 32–48)
pH, Arterial: 7.47 — ABNORMAL HIGH (ref 7.35–7.45)
pO2, Arterial: 76 mmHg — ABNORMAL LOW (ref 83–108)

## 2021-05-31 LAB — COMPREHENSIVE METABOLIC PANEL
ALT: 81 U/L — ABNORMAL HIGH (ref 0–44)
AST: 44 U/L — ABNORMAL HIGH (ref 15–41)
Albumin: <1.5 g/dL — ABNORMAL LOW (ref 3.5–5.0)
Alkaline Phosphatase: 162 U/L — ABNORMAL HIGH (ref 38–126)
Anion gap: 6 (ref 5–15)
BUN: 19 mg/dL (ref 8–23)
CO2: 28 mmol/L (ref 22–32)
Calcium: 10.7 mg/dL — ABNORMAL HIGH (ref 8.9–10.3)
Chloride: 99 mmol/L (ref 98–111)
Creatinine, Ser: 0.93 mg/dL (ref 0.61–1.24)
GFR, Estimated: >60 mL/min (ref >60)
Glucose, Bld: 101 mg/dL — ABNORMAL HIGH (ref 70–99)
Potassium: 4.4 mmol/L (ref 3.5–5.1)
Sodium: 133 mmol/L — ABNORMAL LOW (ref 135–145)
Total Bilirubin: 0.3 mg/dL (ref 0.3–1.2)
Total Protein: 5.4 g/dL — ABNORMAL LOW (ref 6.5–8.1)

## 2021-05-31 LAB — C-REACTIVE PROTEIN: CRP: 12.2 mg/dL — ABNORMAL HIGH (ref <1.0)

## 2021-05-31 LAB — CBC
HCT: 30.7 % — ABNORMAL LOW (ref 39.0–52.0)
Hemoglobin: 9.9 g/dL — ABNORMAL LOW (ref 13.0–17.0)
MCH: 26.7 pg (ref 26.0–34.0)
MCHC: 32.2 g/dL (ref 30.0–36.0)
MCV: 82.7 fL (ref 80.0–100.0)
Platelets: 714 10*3/uL — ABNORMAL HIGH (ref 150–400)
RBC: 3.71 MIL/uL — ABNORMAL LOW (ref 4.22–5.81)
RDW: 13.2 % (ref 11.5–15.5)
WBC: 15.4 10*3/uL — ABNORMAL HIGH (ref 4.0–10.5)
nRBC: 0 % (ref 0.0–0.2)

## 2021-05-31 LAB — PROTIME-INR
INR: 1.1 (ref 0.8–1.2)
Prothrombin Time: 14.7 seconds (ref 11.4–15.2)

## 2021-05-31 LAB — APTT: aPTT: 33 seconds (ref 24–36)

## 2021-05-31 LAB — PREPARE RBC (CROSSMATCH)

## 2021-05-31 LAB — PROCALCITONIN: Procalcitonin: 0.56 ng/mL

## 2021-05-31 LAB — ABO/RH: ABO/RH(D): B POS

## 2021-05-31 MED ORDER — SODIUM CHLORIDE (PF) 0.9 % IJ SOLN
10.0000 mg | Freq: Once | INTRAMUSCULAR | Status: AC
  Administered 2021-05-31: 10 mg via INTRAPLEURAL
  Filled 2021-05-31: qty 10

## 2021-05-31 MED ORDER — STERILE WATER FOR INJECTION IJ SOLN
5.0000 mg | Freq: Once | RESPIRATORY_TRACT | Status: AC
  Administered 2021-05-31: 5 mg via INTRAPLEURAL
  Filled 2021-05-31: qty 5

## 2021-05-31 MED ORDER — CALCITONIN (SALMON) 200 UNIT/ML IJ SOLN
100.0000 [IU] | Freq: Every day | INTRAMUSCULAR | Status: DC
  Administered 2021-05-31 – 2021-06-02 (×2): 100 [IU] via INTRAMUSCULAR
  Filled 2021-05-31 (×4): qty 0.5

## 2021-05-31 MED ORDER — LACTATED RINGERS IV SOLN: INTRAVENOUS | Status: AC

## 2021-05-31 MED ORDER — MIDODRINE HCL 5 MG PO TABS
10.0000 mg | ORAL_TABLET | Freq: Three times a day (TID) | ORAL | Status: DC
  Administered 2021-05-31 – 2021-06-06 (×15): 10 mg via ORAL
  Filled 2021-05-31 (×15): qty 2

## 2021-05-31 MED ORDER — PANTOPRAZOLE SODIUM 40 MG PO TBEC
40.0000 mg | DELAYED_RELEASE_TABLET | Freq: Every day | ORAL | Status: DC
Start: 2021-05-31 — End: 2021-06-08
  Administered 2021-05-31 – 2021-06-08 (×7): 40 mg via ORAL
  Filled 2021-05-31 (×7): qty 1

## 2021-05-31 MED ORDER — CEFAZOLIN SODIUM-DEXTROSE 2-4 GM/100ML-% IV SOLN
2.0000 g | INTRAVENOUS | Status: AC
  Administered 2021-06-01: 2 g via INTRAVENOUS
  Filled 2021-05-31: qty 100

## 2021-05-31 MED ORDER — PROSOURCE PLUS PO LIQD
30.0000 mL | Freq: Two times a day (BID) | ORAL | Status: DC
  Administered 2021-05-31 – 2021-06-08 (×11): 30 mL via ORAL
  Filled 2021-05-31 (×11): qty 30

## 2021-05-31 MED ORDER — SODIUM CHLORIDE 0.9% FLUSH
10.0000 mL | Freq: Three times a day (TID) | INTRAVENOUS | Status: DC
  Administered 2021-06-01 – 2021-06-02 (×3): 10 mL

## 2021-05-31 NOTE — Procedures (Signed)
Pleural Fibrinolytic Administration Procedure Note ? ?BASCOM BIEL  ?[id number]  ?10/14/1960 ? ?Date:05/31/21  ?Time:12:11 PM  ? ?Provider Performing:Benancio Osmundson  ? ?Procedure: Pleural Fibrinolysis Initial day (12248) ? ?Indication(s) ?Fibrinolysis of complicated pleural effusion ? ?Consent ?Risks of the procedure as well as the alternatives and risks of each were explained to the patient and/or caregiver.  Consent for the procedure was obtained. ? ? ?Anesthesia ?None ? ? ?Time Out ?Verified patient identification, verified procedure, site/side was marked, verified correct patient position, special equipment/implants available, medications/allergies/relevant history reviewed, required imaging and test results available. ? ? ?Sterile Technique ?Hand hygiene, gloves ? ? ?Procedure Description ?Existing pleural catheter was cleaned and accessed in sterile manner.  10mg  of tPA in 30cc of saline and 5mg  of dornase in 30cc of sterile water were injected into pleural space using existing pleural catheter.  Catheter will be clamped for 1 hour and then placed back to suction. ? ? ?Complications/Tolerance ?None; patient tolerated the procedure well. ? ? ?EBL ?None ? ? ?Specimen(s) ?None ? ?Marshell Garfinkel MD ?[facility] ?See Amion for pager ? ?If no response to pager , please call [phone] until 7pm ?After 7:00 pm call Elink  [phone] ?05/31/2021, 12:11 PM  ? ? ?

## 2021-05-31 NOTE — Plan of Care (Signed)
°  Problem: Education: °Goal: Knowledge of General Education information will improve °Description: Including pain rating scale, medication(s)/side effects and non-pharmacologic comfort measures °Outcome: Progressing °  °Problem: Health Behavior/Discharge Planning: °Goal: Ability to manage health-related needs will improve °Outcome: Progressing °  °Problem: Clinical Measurements: °Goal: Ability to maintain clinical measurements within normal limits will improve °Outcome: Progressing °Goal: Will remain free from infection °Outcome: Progressing °Goal: Diagnostic test results will improve °Outcome: Progressing °Goal: Respiratory complications will improve °Outcome: Progressing °Goal: Cardiovascular complication will be avoided °Outcome: Progressing °  °Problem: Activity: °Goal: Risk for activity intolerance will decrease °Outcome: Progressing °  °Problem: Coping: °Goal: Level of anxiety will decrease °Outcome: Progressing °  °Problem: Elimination: °Goal: Will not experience complications related to bowel motility °Outcome: Progressing °  °Problem: Pain Managment: °Goal: General experience of comfort will improve °Outcome: Progressing °  °Problem: Safety: °Goal: Ability to remain free from injury will improve °Outcome: Progressing °  °Problem: Skin Integrity: °Goal: Risk for impaired skin integrity will decrease °Outcome: Progressing °  °

## 2021-05-31 NOTE — Final Consult Note (Addendum)
[address]       [place] [address]             [phone]        Lucas Burke [facility] Medical Record #[id number] Date of Birth: 1960-06-08  Referring: Leroy Sea MD Primary Care: [facility] Primary Cardiologist:None  Reason for consult: Evaluation for potential surgical management of left empyema.   History of Present Illness:   [name] is a 61 year old male admitted to [facility] with pneumonia and sepsis on 05/22/2021 after being brought to the emergency room by EMS. He has a past medical history notable for chronic back pain, 2 pack/day tobacco use, anxiety, and panic attacks.  On arrival to the ED, he was noted to have generalized weakness, respiratory failure, fever, thrombocytosis, and leukocytosis.  The initial chest x-ray showed complete opacification of the lower half of the left hemithorax.  He was started on broad-spectrum IV antibiotics and resuscitated with IV fluids.  Blood cultures were obtained and have shown no growth.  Respiratory panel and COVID test were negative.  He was noted to be hypercalcemic and also continued to have fevers.  Chest CT was obtained on 05/27/2021 confirming a large loculated left pleural effusion with nearly complete atelectasis of the left lung.  There was narrowing of the left main bronchus in the left hilum and multiple enlarged mediastinal lymph nodes raising suspicion of a malignant process.  The interventional radiology team was consulted on 05/28/2021 for left thoracentesis.  After discussion with the primary team, decision was made to proceed to chest tube placement.  On that same day, a 14 French pigtail catheter was placed in the left pleural space.  There was immediate return of about 180 mL of thick purulent material.  The catheter has continued to drain with approximately 250 mL out over the past 24 hours.  Continues to be thick purulent drainage.  Dr.  Maren Beach was consulted by phone and decision was made to transfer Mr. Bayha to [facility] for further evaluation and management.  The pulmonary service is also been consulted for potential lytic therapy. At the time of this interview, Mr. Mcrea is resting comfortably in his bed on 5 W.  He denies any pain or shortness of breath currently.  He is oxygenating well on nasal cannula O2.  He continues to have a coarse productive cough.  He tells me he had a 60 pound weight loss during the month prior to his admission to [facility] on 05/22/2021.    Current Activity/ Functional Status: Patient is independent with mobility/ambulation, transfers, ADL's, IADL's.   Zubrod Score: At the time of surgery this patient's most appropriate activity status/level should be described as: []     0    Normal activity, no symptoms []     1    Restricted in physical strenuous activity but ambulatory, able to do out light work []     2    Ambulatory and capable of self care, unable to do work activities, up and about                 more than 50%  Of the time                            []     3    Only limited self care, in bed greater than 50%  of waking hours []     4    Completely disabled, no self care, confined to bed or chair []     5    Moribund  Past Medical History:  Diagnosis Date   Anxiety    Chronic back pain    Cigarette smoker    Hypokalemia    Panic attacks     Past Surgical History:  Procedure Laterality Date   COLONOSCOPY  2010    Social History   Tobacco Use  Smoking Status Every Day   Packs/day: 2.00   Types: Cigarettes  Smokeless Tobacco Never    Social History   Substance and Sexual Activity  Alcohol Use Not Currently     Allergies  Allergen Reactions   Codeine Other (See Comments)    Critical    Current Facility-Administered Medications  Medication Dose Route Frequency Provider Last Rate Last Admin   acetaminophen (TYLENOL) tablet 650 mg  650 mg Oral Q6H  PRN Synetta Fail, MD       Or   acetaminophen (TYLENOL) suppository 650 mg  650 mg Rectal Q6H PRN Synetta Fail, MD       ALPRAZolam Prudy Feeler) tablet 0.25 mg  0.25 mg Oral TID PRN Synetta Fail, MD       alum & mag hydroxide-simeth (MAALOX/MYLANTA) 200-200-20 MG/5ML suspension 30 mL  30 mL Oral Q4H PRN Synetta Fail, MD       Ampicillin-Sulbactam (UNASYN) 3 g in sodium chloride 0.9 % 100 mL IVPB  3 g Intravenous Q6H Synetta Fail, MD 200 mL/hr at 05/31/21 0345 3 g at 05/31/21 0345   enoxaparin (LOVENOX) injection 40 mg  40 mg Subcutaneous Q24H Synetta Fail, MD   40 mg at 05/30/21 2212   feeding supplement (ENSURE ENLIVE / ENSURE PLUS) liquid 237 mL  237 mL Oral TID BM Synetta Fail, MD   237 mL at 05/30/21 2209   fentaNYL (SUBLIMAZE) injection 12.5 mcg  12.5 mcg Intravenous Q2H PRN Synetta Fail, MD       lactated ringers infusion   Intravenous Continuous Leroy Sea, MD 125 mL/hr at 05/31/21 0609 New Bag at 05/31/21 0609   multivitamin with minerals tablet 1 tablet  1 tablet Oral Daily Synetta Fail, MD       ondansetron [facility]) tablet 4 mg  4 mg Oral Q6H PRN Synetta Fail, MD       oxyCODONE (Oxy IR/ROXICODONE) immediate release tablet 5-10 mg  5-10 mg Oral Q6H PRN Synetta Fail, MD   10 mg at 05/30/21 2209   PARoxetine (PAXIL) tablet 20 mg  20 mg Oral Daily Synetta Fail, MD       polyethylene glycol (MIRALAX / GLYCOLAX) packet 17 g  17 g Oral Daily PRN Synetta Fail, MD       sodium chloride flush (NS) 0.9 % injection 3 mL  3 mL Intravenous Q12H Synetta Fail, MD   3 mL at 05/30/21 2215   traMADol (ULTRAM) tablet 100 mg  100 mg Oral Q6H PRN Synetta Fail, MD        Medications Prior to Admission  Medication Sig Dispense Refill Last Dose   acetaminophen (TYLENOL) 325 MG tablet Take 650 mg by mouth every 6 (six) hours as needed.      acetaminophen (TYLENOL) 500 MG tablet Take 2 tablets (1,000 mg  total) by mouth every 6 (six) hours as needed for mild pain (or Fever >/= 101). 30  tablet 0    ALPRAZolam (XANAX) 0.25 MG tablet Take 1 tablet (0.25 mg total) by mouth 3 (three) times daily as needed for anxiety. 30 tablet 0    alum & mag hydroxide-simeth (MAALOX/MYLANTA) 200-200-20 MG/5ML suspension Take 30 mLs by mouth every 4 (four) hours as needed for indigestion. 355 mL 0    Ampicillin-Sulbactam 3 g in sodium chloride 0.9 % 100 mL Inject 3 g into the vein every 6 (six) hours.      enoxaparin (LOVENOX) 40 MG/0.4ML injection Inject 0.4 mLs (40 mg total) into the skin daily. 0 mL     feeding supplement (ENSURE ENLIVE / ENSURE PLUS) LIQD Take 237 mLs by mouth 3 (three) times daily between meals. 237 mL 12    fentaNYL (SUBLIMAZE) 50 MCG/ML injection Inject 0.25 mLs (12.5 mcg total) into the vein every 2 (two) hours as needed (breakthrough pain despite oral meds).  0    ferrous sulfate 325 (65 FE) MG tablet Take 1 tablet (325 mg total) by mouth every other day.  3    gabapentin (NEURONTIN) 300 MG capsule Take 300 mg by mouth 2 (two) times daily.      ibuprofen (ADVIL) 800 MG tablet Take 800 mg by mouth every 8 (eight) hours as needed.      Multiple Vitamin (MULTIVITAMIN WITH MINERALS) TABS tablet Take 1 tablet by mouth daily.      ondansetron (ZOFRAN) 4 MG tablet Take 1 tablet (4 mg total) by mouth every 6 (six) hours as needed for nausea. 20 tablet 0    oxyCODONE (OXY IR/ROXICODONE) 5 MG immediate release tablet Take 1-2 tablets (5-10 mg total) by mouth every 6 (six) hours as needed for severe pain. 30 tablet 0    PARoxetine (PAXIL) 20 MG tablet Take 1 tablet by mouth daily.      sodium chloride 0.9 % infusion Inject 10 mLs into the vein as needed (for administration of IV medications (carrier fluid)).  0    traMADol (ULTRAM) 50 MG tablet Take 2 tablets (100 mg total) by mouth every 6 (six) hours as needed for moderate pain. 30 tablet      No family history on file.   Review of Systems:       Cardiac Review of Systems: Y or  [    ]= no  Chest Pain [    ]  Resting SOB [   x] Exertional SOB  [ x ]  Orthopnea [  ]   Pedal Edema [   ]    Palpitations [  ] Syncope  [  ]   Presyncope [   ]  General Review of Systems: [Y] = yes [  ]=no Constitional: recent weight change [ x ]; anorexia [  x]; fatigue [  ]; nausea [  ]; night sweats [  ]; fever [  ]; or chills [  ]                                                               Dental: Last Dentist visit: Edentulous  Eye : blurred vision [  ]; diplopia [   ]; vision changes [  ];  Amaurosis fugax[  ]; Resp: cough [  ];  wheezing[  ];  hemoptysis[  ]; shortness of  breath[ x ]; paroxysmal nocturnal dyspnea[  ]; dyspnea on exertion[  ]; or orthopnea[  ];  GI:  gallstones[  ], vomiting[  ];  dysphagia[  ]; melena[  ];  hematochezia [  ]; heartburn[  ];   Hx of  Colonoscopy[  ]; GU: kidney stones [  ]; hematuria[  ];   dysuria [  ];  nocturia[  ];  history of     obstruction [  ]; urinary frequency [  ]             Skin: rash, swelling[  ];, hair loss[  ];  peripheral edema[  ];  or itching[  ]; Musculosketetal: myalgias[  ];  joint swelling[  ];  joint erythema[  ];  joint pain[  ];  back pain[  ];  Heme/Lymph: bruising[  ];  bleeding[  ];  anemia[  ];  Neuro: TIA[  ];  headaches[  ];  stroke[  ];  vertigo[  ];  seizures[  ];   paresthesias[  ];  difficulty walking[  ];  Psych:depression[  ]; anxiety[  x];  Endocrine: diabetes[  ];  thyroid dysfunction[  ];                  Physical Exam: BP (!) 88/53 (BP Location: Right Arm)   Pulse 64   Temp 98.3 F (36.8 C) (Oral)   Resp 20   SpO2 95%    General appearance: alert, cooperative, and mild distress Head: Normocephalic, without obvious abnormality, atraumatic Neck: no adenopathy, no carotid bruit, no JVD, and supple, symmetrical, trachea midline Lymph nodes: No palpable cervical or clavicular adenopathy Resp: Breath sounds are absent on the left.  Clear on the right.  He has a coarse  productive cough.  Chest tube exits the left chest posteriorly.  There is thick white drainage in the tubing. Cardio: Regular rate and rhythm, no murmur.  Monitor shows normal sinus rhythm. GI: Soft, nontender, active bowel sounds Extremities: Well perfused, palpable distal pulses.  No obvious deformities. Neurologic: Grossly normal  Diagnostic Studies & Laboratory data:     Recent Radiology Findings:   DG CHEST PORT 1 VIEW  Result Date: 05/31/2021 CLINICAL DATA:  Empyema, patient reports shortness of breath with left-sided chest pain. EXAM: PORTABLE CHEST 1 VIEW COMPARISON:  CT chest dated May 27, 2021 FINDINGS: Evaluation of cardiomediastinal silhouette is limited due to large left pleural effusion. Large loculated left pleural effusion with small area of aeration in the left upper lobe, slightly improved from prior CT examination. No appreciable pneumothorax. Right lung is clear. IMPRESSION: Large loculated left pleural effusion with small area of aeration in the left upper lobe, slightly improved from prior CT examination. Right lung is clear. Electronically Signed   By: Larose Hires D.O.   On: 05/31/2021 08:53   US THYROID  Result Date: 05/29/2021 CLINICAL DATA:  Palpable abnormality. EXAM: THYROID ULTRASOUND TECHNIQUE: Ultrasound examination of the thyroid gland and adjacent soft tissues was performed. COMPARISON:  None. FINDINGS: Parenchymal Echotexture: Mildly heterogenous Isthmus: 0.3 cm Right lobe: 4.2 x 1.6 x 1.8 cm Left lobe: 3.6 x 1.4 x 1.7 cm _________________________________________________________ Estimated total number of nodules >/= 1 cm: 0 Number of spongiform nodules >/=  2 cm not described below (TR1): 0 Number of mixed cystic and solid nodules >/= 1.5 cm not described below (TR2): 0 _________________________________________________________ No solid nodules identified. 3 mm cyst in the right lobe has a benign appearance. No abnormal lymph nodes identified by  ultrasound.  IMPRESSION: Unremarkable thyroid ultrasound. The above is in keeping with the ACR TI-RADS recommendations - J Am Coll Radiol 2017;14:587-595. Electronically Signed   By: Irish Lack M.D.   On: 05/29/2021 11:54     I have independently reviewed the above radiologic studies and discussed with the patient   Recent Lab Findings: Lab Results  Component Value Date   WBC 15.4 (H) 05/31/2021   HGB 9.9 (L) 05/31/2021   HCT 30.7 (L) 05/31/2021   PLT 714 (H) 05/31/2021   GLUCOSE 101 (H) 05/31/2021   ALT 81 (H) 05/31/2021   AST 44 (H) 05/31/2021   NA 133 (L) 05/31/2021   K 4.4 05/31/2021   CL 99 05/31/2021   CREATININE 0.93 05/31/2021   BUN 19 05/31/2021   CO2 28 05/31/2021      Assessment / Plan:    61 year old male with extensive smoking history, chronic pain syndrome, anxiety, and panic attacks admitted to [facility] on 05/22/21 with sepsis related to left-sided empyema.  He has been treated with broad-spectrum antibiotics.  Blood cultures have remained negative.  Gram stain on the left pleural aspirate obtained at the time of chest tube placement on 324 showed abundant white cells no organisms.  I do not see a record of cultures being sent on that fluid.  The CT scan shows a complex loculated left empyema with mediastinal lymphadenopathy and partial obstruction of the left mainstem bronchus.  Patient also gives a history of unintended weight loss.  We agree that left video-assisted thoracoscopy for drainage of this complex effusion and decortication may be required but recommend lytic therapy as a first-line of management.  Diagnostic bronchoscopy may also be of benefit given his mediastinal adenopathy and left mainstem partial obstruction. We will continue to follow with you.    I  spent 30 minutes counseling the patient face to face.  Leary Roca, PA-C  05/31/2021 8:58 AM  Patient examined, images of  CT chest and last CXR reviewed and discussed with  patient. Patient has been ill since mid Feb with wet cough, anorexia, fatigue. Admitted to [facility] with large empyema, pigtail cath ny IR has had minimal effect. Lytic therapy this am without any effect. I have recommended L VATS  to the patient as the best long term therapy to resolve this severe thoracic infection. Will also perform videobronchoscopy while under anesthesia to assess for bronchial obstruction  as  a factor in causing L pneumonia/ pleural space infection.  patient examined and medical record reviewed,agree with above note. Lovett Sox 05/31/2021

## 2021-05-31 NOTE — Progress Notes (Signed)
PT Cancellation Note ? ?Patient Details ?Name: Lucas Burke ?MRN: [id number] ?DOB: 06/11/1960 ? ? ?Cancelled Treatment:    Reason Eval/Treat Not Completed: Patient at procedure or test/unavailable ? ?Surgery working with pt. Will reattempt later today ? ? ?Arby Barrette, PT ?Acute Rehabilitation Services  ?Pager [phone] ?Office [phone] ? ?Lucas Burke ?05/31/2021, 9:39 AM ?

## 2021-05-31 NOTE — Progress Notes (Signed)
?  Transition of Care (TOC) Screening Note ? ? ?Patient Details  ?Name: Lucas Burke ?Date of Birth: [birth year] ? ? ?Transition of Care (TOC) CM/SW Contact:    ?Benard Halsted, LCSW ?Phone Number: ?05/31/2021, 8:52 AM ? ? ? ?Transition of Care Department [facility]) has reviewed patient. Patient transferred from [facility] and was set up with [facility] services. We will continue to monitor patient advancement through interdisciplinary progression rounds. If new patient transition needs arise, please place a TOC consult. ? ? ?

## 2021-05-31 NOTE — Progress Notes (Signed)
OT Cancellation Note ? ?Patient Details ?Name: Lucas Burke ?MRN: [id number] ?DOB: September 20, 1960 ? ? ?Cancelled Treatment:    Reason Eval/Treat Not Completed: Other (comment). Pt currently under mobility restrictions following Pleural Fibrinolysis procedure. Plan to attempt.  ? ?Tyrone Schimke, OT ?Acute Rehabilitation Services ?Office: [phone] ? ?05/31/2021, 12:35 PM ?

## 2021-05-31 NOTE — Progress Notes (Signed)
Initial Nutrition Assessment ? ?DOCUMENTATION CODES:  ? ?Not applicable ? ?INTERVENTION:  ? ?Continue Multivitamin w/ minerals daily ?Ensure Enlive po BID, each supplement provides 350 kcal and 20 grams of protein. ?Liberalize pt diet to regular due to recent diagnosis of malnutrition.  ?Encourage good PO intake ?Recommend obtaining new weight. RN notified.  ? ?NUTRITION DIAGNOSIS:  ? ?Increased nutrient needs related to acute illness (Sepsis) as evidenced by estimated needs. ? ?GOAL:  ? ?Patient will meet greater than or equal to 90% of their needs ? ?MONITOR:  ? ?PO intake, Supplement acceptance, Labs, Weight trends ? ?REASON FOR ASSESSMENT:  ? ?Consult ?Assessment of nutrition requirement/status ? ?ASSESSMENT:  ? ?61 y.o. male presented to the ED with generalized weakness. Pt recently admitted to [facility] for sepsis 2/2 pneumonia. PMH includes malnutrition. Pt admitted with sepsis and empyema of L pleural space.  ? ?Pt unavailable at time of RD visit, working with other discipline. ? ?Pt recently evaluated by RD at [facility]. Per RD note, pt reported decreased appetite and poor oral intake.  ?No meal intakes recorded within EMR.  ? ?No weights recorded within the past year to assess weight loss. Pt has also not been weighed this admission.  ? ?Medications reviewed and include: Calcitonin, MVI, Protonix, IV antibiotics ?Labs reviewed: Sodium 133 ? ?NUTRITION - FOCUSED PHYSICAL EXAM: ? ?Deferred to follow-up.  ? ?Diet Order:   ?Diet Order   ? ?       ?  Diet Heart Room service appropriate? Yes; Fluid consistency: Thin  Diet effective now       ?  ? ?  ?  ? ?  ? ? ?EDUCATION NEEDS:  ? ?No education needs have been identified at this time ? ?Skin:  Skin Assessment: Reviewed RN Assessment ? ?Last BM:  3/25 ? ?Height:  ? ?Ht Readings from Last 1 Encounters:  ?05/22/21 6' (1.829 m)  ? ? ?Weight:  ? ?Wt Readings from Last 1 Encounters:  ?05/22/21 71.4 kg  ? ? ?Ideal Body Weight:  80.9 kg ? ?BMI:  There is no height or weight on  file to calculate BMI. ? ?Estimated Nutritional Needs:  ? ?Kcal:  2100-2300 ? ?Protein:  105-150 grams ? ?Fluid:  >/= 2.1 L ? ? ? ?Hermina Barters RD, LDN ?Clinical Dietitian ?See AMiON for contact information.  ? ?

## 2021-05-31 NOTE — Progress Notes (Signed)
?                                  PROGRESS NOTE                                             ?                                                                                                                     ?                                         ? ? Patient Demographics:  ? ? Lucas Burke, is a 61 y.o. male, DOB - 1961-01-04, JIR:[id number] ? ?Outpatient Primary MD for the patient is [facility], [facility]    LOS - 1  Admit date - 05/30/2021   ? ?No chief complaint on file. ?    ? ?Brief Narrative (HPI from H&P) history 61 year old Caucasian male with history of chronic pain, panic attacks, anxiety, tremors who presented to [facility] hospital on 05/22/2021 with generalized weakness, his work-up was suggestive of left-sided empyema requiring chest tube placement, he was seen by pulmonary critical care and IR at [facility] and then transferred to [facility] on 05/30/2021 for further evaluation by cardiothoracic surgery and pulmonary critical team here.  Of note his CT scan done at [facility] is highly suspicious for left bronchus obstruction and underlying malignancy.  He is extremely weak and cachectic. ? ? Subjective:  ? ? Lucas Burke today has, No headache,  No abdominal pain - No Nausea, No new weakness tingling or numbness, improved shortness of breath minimal pleuritic left-sided chest pain at the site of chest tube. ? ? Assessment  & Plan :  ? ? ?Pneumonia, severe sepsis with left-sided empyema - he was seen by pulmonary critical care and IR team at [facility] underwent left-sided chest tube placement on 05/28/2021 with minimal improvement, now transferred to [facility] on 05/30/2021, currently on Unasyn, pulmonary and cardiothoracic teams have been consulted.  Continue to monitor. ? ?2.  CT scan done at [facility] highly suspicious for left bronchial obstruction and left-sided mediastinal adenopathy suspicious for malignancy.  Defer this management  to pulmonary critical care. ? ?3. Severe protein calorie malnutrition and cachexia.  Placed on protein supplements. ?  ?4.  Severe hypercalcemia with appropriately suppressed PTH.  Kindly see #2 above monitor calcium levels underlying malignant.  Aggressively hydrate, salmon calcitonin, may require Zometa. ? ?5.  Weakness and deconditioning.  PT OT. ? ?6.  Iron deficiency.  On iron supplements orally. ? ?7.  Chronic neck back pain.  Supportive care. ? ?8.  Suppressed PTH in response to hypercalcemia  likely due to underlying malignancy.  Treat hypercalcemia. ? ?   ? ?Condition - Extremely Guarded ? ?Family Communication  : None present ? ?Code Status : Full ? ?Consults  : Pulmonary critical care, cardiothoracic surgery ? ?PUD Prophylaxis : PPI ? ? Procedures  :    ? ?CT chest done at [facility] 05/27/2021.  Large loculated left-sided pleural effusion, possible obstruction of the left bronchus with mediastinal adenopathy on the left side.   ? ?05/28/2021.  CT-guided left chest tube placement by IR at [facility]. ? ?Thyroid ultrasound.  Unremarkable. ? ?   ? ?Disposition Plan  :   ? ?Status is: Inpatient ? ?DVT Prophylaxis  :   ? ?enoxaparin (LOVENOX) injection 40 mg Start: 05/30/21 2200 ?  ? ?Lab Results  ?Component Value Date  ? PLT 714 (H) 05/31/2021  ? ? ?Diet :  ?Diet Order   ? ?       ?  Diet Heart Room service appropriate? Yes; Fluid consistency: Thin  Diet effective now       ?  ? ?  ?  ? ?  ?  ? ?Inpatient Medications ? ?Scheduled Meds: ? alteplase (TPA) for intrapleural administration  10 mg Intrapleural Once  ? And  ? pulmozyme (DORNASE) for intrapleural administration  5 mg Intrapleural Once  ? enoxaparin (LOVENOX) injection  40 mg Subcutaneous Q24H  ? feeding supplement  237 mL Oral TID BM  ? multivitamin with minerals  1 tablet Oral Daily  ? PARoxetine  20 mg Oral Daily  ? sodium chloride flush  10 mL Other Q8H  ? sodium chloride flush  3 mL Intravenous Q12H  ? ?Continuous Infusions: ?  ampicillin-sulbactam (UNASYN) 3 g IVPB (Mini-Bag Plus) 3 g (05/31/21 1012)  ? lactated ringers 125 mL/hr at 05/31/21 4403  ? ?PRN Meds:.acetaminophen **OR** acetaminophen, ALPRAZolam, alum & mag hydroxide-simeth, fentaNYL, ondansetron, oxyCODONE, polyethylene glycol, traMADol ? ?Antibiotics  :   ? ?Anti-infectives (From admission, onward)  ? ? Start     Dose/Rate Route Frequency Ordered Stop  ? 05/30/21 2200  Ampicillin-Sulbactam (UNASYN) 3 g in sodium chloride 0.9 % 100 mL IVPB       ? 3 g ?200 mL/hr over 30 Minutes Intravenous Every 6 hours 05/30/21 2044    ? ?  ? ? ? Time Spent in minutes  30 ? ? ?Lucas Burke M.D on 05/31/2021 at 10:46 AM ? ?To page go to [email or web address]  ? ?[facility] -  Office  [phone] ? ?See all Orders from today for further details ? ? ? Objective:  ? ?Vitals:  ? 05/31/21 0000 05/31/21 0416 05/31/21 0417 05/31/21 0737  ?BP: (!) 95/59 (!) 88/60  (!) 88/53  ?Pulse: 70 65  64  ?Resp: 17 18 20 20   ?Temp:  97.7 ?F (36.5 ?C)  98.3 ?F (36.8 ?C)  ?TempSrc:  Oral  Oral  ?SpO2: 93% 96%  95%  ? ? ?Wt Readings from Last 3 Encounters:  ?05/22/21 71.4 kg  ?05/22/19 79.4 kg  ?01/30/19 79.4 kg  ? ? ? ?Intake/Output Summary (Last 24 hours) at 05/31/2021 1046 ?Last data filed at 05/31/2021 0740 ?Gross per 24 hour  ?Intake --  ?Output 1650 ml  ?Net -1650 ml  ? ? ? ?Physical Exam ? ?Middle-aged Caucasian male who appears ill and cachectic lying in hospital bed in no apparent distress, no focal deficits, ?[place].AT,PERRAL ?Supple Neck, No JVD,   ?Symmetrical Chest wall movement, reduced left-sided breath sounds with left-sided chest tube in place. ?  RRR,No Gallops,Rubs or new Murmurs,  ?+ve B.Sounds, Abd Soft, No tenderness,   ?No Cyanosis, Clubbing or edema  ?  ? ?RN pressure injury documentation: ?  ? ? Data Review:  ? ? ?CBC ?Recent Labs  ?Lab 05/25/21 ?0441 05/26/21 ?0436 05/27/21 ?0429 05/28/21 ?0400 05/29/21 ?6333 05/30/21 ?5456 05/31/21 ?2563  ?WBC 18.0* 22.0* 21.3* 22.0* 16.6* 15.9* 15.4*  ?HGB  11.1* 9.9* 9.8* 10.1* 9.6* 10.3* 9.9*  ?HCT 35.8* 31.5* 30.4* 32.0* 30.1* 33.0* 30.7*  ?PLT 729* 683* 659* 702* 717* 768* 714*  ?MCV 82.3 81.4 80.6 81.0 79.8* 81.5 82.7  ?MCH 25.5* 25.6* 26.0 25.6* 25.5* 25.4* 26.7  ?MCHC 31.0 31.4 32.2 31.6 31.9 31.2 32.2  ?RDW 13.2 13.2 13.3 13.4 13.3 13.3 13.2  ?LYMPHSABS 1.2 1.4  --   --   --   --   --   ?MONOABS 1.6* 2.1*  --   --   --   --   --   ?EOSABS 0.1 0.1  --   --   --   --   --   ?BASOSABS 0.0 0.1  --   --   --   --   --   ? ? ?Electrolytes ?Recent Labs  ?Lab 05/25/21 ?0441 05/27/21 ?0429 05/27/21 ?2017 05/28/21 ?0400 05/28/21 ?0423 05/29/21 ?8937 05/30/21 ?3428 05/31/21 ?7681 05/31/21 ?1572  ?NA 136 133* 133*  --  133*  --  135 133*  --   ?K 4.2 4.2 4.2  --  4.3  --  4.6 4.4  --   ?CL 102 99 99  --  99  --  99 99  --   ?CO2 31 28 27   --  27  --  31 28  --   ?GLUCOSE 109* 110* 116*  --  102*  --  101* 101*  --   ?BUN 24* 19 20  --  20  --  20 19  --   ?CREATININE 1.03 0.93 0.98  --  1.00 0.88 1.02 0.93  --   ?CALCIUM 11.2* 10.0 9.9  --  10.0  --  10.4* 10.7*  --   ?AST 21 49*  --   --   --   --   --  44*  --   ?ALT 19 39  --   --   --   --   --  81*  --   ?ALKPHOS 194* 189*  --   --   --   --   --  162*  --   ?BILITOT 0.6 0.6  --   --   --   --   --  0.3  --   ?ALBUMIN 1.7* 1.6*  --  1.7*  --   --   --  <1.5*  --   ?MG  --  1.8  --   --   --  1.9  --   --   --   ?CRP  --   --   --   --   --   --   --   --  12.2*  ?PROCALCITON  --  1.13  --   --   --   --   --   --  0.56  ? ? ?------------------------------------------------------------------------------------------------------------------ ?No results for input(s): CHOL, HDL, LDLCALC, TRIG, CHOLHDL, LDLDIRECT in the last 72 hours. ? ?No results found for: HGBA1C ? ?No results for input(s): TSH, T4TOTAL, T3FREE, THYROIDAB in the last 72 hours. ? ?Invalid input(s): FREET3 ?------------------------------------------------------------------------------------------------------------------ ?ID Labs ?  Recent Labs  ?Lab  05/27/21 ?0429 05/27/21 ?2017 05/28/21 ?0400 05/28/21 ?0423 05/29/21 ?0165 05/30/21 ?8006 05/31/21 ?3494 05/31/21 ?9447  ?WBC 21.3*  --  22.0*  --  16.6* 15.9* 15.4*  --   ?PLT 659*  --  702*  --  717* 768* 714*  --   ?CRP  --

## 2021-05-31 NOTE — Evaluation (Signed)
Clinical/Bedside Swallow Evaluation ?Patient Details  ?Name: Lucas Burke ?MRN: [id number] ?Date of Birth: September 24, 1960 ? ?Today's Date: 05/31/2021 ?Time: SLP Start Time (ACUTE ONLY): 0277 SLP Stop Time (ACUTE ONLY): 4128 ?SLP Time Calculation (min) (ACUTE ONLY): 9 min ? ?Past Medical History:  ?Past Medical History:  ?Diagnosis Date  ? Anxiety   ? Chronic back pain   ? Cigarette smoker   ? Hypokalemia   ? Panic attacks   ? ?Past Surgical History:  ?Past Surgical History:  ?Procedure Laterality Date  ? COLONOSCOPY  2010  ? ?HPI:  ?61 year old Caucasian male with history of chronic pain, panic attacks, anxiety, tremors who presented to [facility] hospital on 05/22/2021 with generalized weakness, his work-up was suggestive of left-sided empyema requiring chest tube placement. Transferred to [facility] on 05/30/2021 for further evaluation by cardiothoracic surgery and pulmonary critical team here. Per MD note his CT scan is highly suspicious for left bronchus obstruction and underlying malignancy.  BSE at [facility] without overt s/s aspiration and recommended regular texture, thin liquids.  ?  ?Assessment / Plan / Recommendation  ?Clinical Impression ? Pt alert, pleasant and cooperative for swallow assessment. Oral-motor exam was unremarkable except for endentulous state and eats regular texture "unless too hard." Respirations at baseline were stable and remained throughout straw sips water, puree and solid with adequate SpO2. Coordination of respiration and swallow was within functional limits. There were 2 delayed subtle coughs that did not appear to be related to po trials. Recommend continue regular/thin, pills with thin, sit upright, straws allowed. No further ST needed. ?SLP Visit Diagnosis: Dysphagia, unspecified (R13.10) ?   ?Aspiration Risk ? Mild aspiration risk  ?  ?Diet Recommendation Regular;Thin liquid  ? ?Liquid Administration via: Cup;Straw ?Medication Administration: Whole meds with liquid ?Supervision:  Patient able to self feed ?Compensations: Slow rate;Small sips/bites ?Postural Changes: Seated upright at 90 degrees  ?  ?Other  Recommendations Oral Care Recommendations: Oral care BID   ? ?Recommendations for follow up therapy are one component of a multi-disciplinary discharge planning process, led by the attending physician.  Recommendations may be updated based on patient status, additional functional criteria and insurance authorization. ? ?Follow up Recommendations No SLP follow up  ? ? ?  ?Assistance Recommended at Discharge Set up Supervision/Assistance  ?Functional Status Assessment    ?Frequency and Duration    ?  ?  ?   ? ?Prognosis    ? ?  ? ?Swallow Study   ?General HPI: 61 year old Caucasian male with history of chronic pain, panic attacks, anxiety, tremors who presented to [facility] hospital on 05/22/2021 with generalized weakness, his work-up was suggestive of left-sided empyema requiring chest tube placement. Transferred to [facility] on 05/30/2021 for further evaluation by cardiothoracic surgery and pulmonary critical team here. Per MD note his CT scan is highly suspicious for left bronchus obstruction and underlying malignancy.  BSE at [facility] without overt s/s aspiration and recommended regular texture, thin liquids. ?Type of Study: Bedside Swallow Evaluation ?Previous Swallow Assessment:  (see HPI) ?Diet Prior to this Study: Regular;Thin liquids ?Temperature Spikes Noted: No ?Respiratory Status: Nasal cannula ?History of Recent Intubation: No ?Behavior/Cognition: Alert;Cooperative;Pleasant mood ?Oral Cavity Assessment: Within Functional Limits ?Oral Care Completed by SLP: No ?Oral Cavity - Dentition: Edentulous ?Vision: Functional for self-feeding ?Self-Feeding Abilities: Able to feed self ?Patient Positioning: Upright in bed ?Baseline Vocal Quality: Normal ?Volitional Cough: Strong ?Volitional Swallow: Able to elicit  ?  ?Oral/Motor/Sensory Function Overall Oral Motor/Sensory Function: Within  functional limits   ?  Ice Chips Ice chips: Not tested   ?Thin Liquid Thin Liquid: Within functional limits  ?  ?Nectar Thick Nectar Thick Liquid: Not tested   ?Honey Thick Honey Thick Liquid: Not tested   ?Puree Puree: Within functional limits   ?Solid ? ? ?  Solid: Within functional limits  ? ?  ? ?Houston Siren ?05/31/2021,1:12 PM ? ? ? ?

## 2021-05-31 NOTE — Evaluation (Signed)
Physical Therapy Evaluation ?Patient Details ?Name: Lucas Burke ?MRN: [id number] ?DOB: 06-18-1960 ?Today's Date: 05/31/2021 ? ?History of Present Illness ? 61 y.o. male presented 05/22/21 with generalized weakness. Adm for sepsis due to pna; large left empyema; chest tube    PMH significant of chronic pain, panic attacks, anxiety, tremors  ?Clinical Impression ?  ?Pt admitted secondary to problem above with deficits below. PTA patient was indpendent with all mobility and caring for 3 children.  Pt currently requires supervision for OOB to chair due to multiple lines (including chest tube). He reports he cannot go back to live where he was living, but does not want to elaborate on the reason. He reports his daughter who lives in [place] is working on finding a place for him to live. Anticipate he will be moving well enough to discharge to whatever location he can secure. Will make MD, RN, and SW aware of his situation. Anticipate patient will benefit from PT to address problems listed below.Will continue to follow acutely to maximize functional mobility independence and safety.   ?   ?   ? ?Recommendations for follow up therapy are one component of a multi-disciplinary discharge planning process, led by the attending physician.  Recommendations may be updated based on patient status, additional functional criteria and insurance authorization. ? ?Follow Up Recommendations No PT follow up ? ?  ?Assistance Recommended at Discharge PRN  ?Patient can return home with the following ? Assistance with cooking/housework;Help with stairs or ramp for entrance ? ?  ?Equipment Recommendations Rolling walker (2 wheels)  ?Recommendations for Other Services ? Other (comment) (Social work)  ?  ?Functional Status Assessment Patient has had a recent decline in their functional status and demonstrates the ability to make significant improvements in function in a reasonable and predictable amount of time.  ? ?  ?Precautions /  Restrictions Precautions ?Precautions: Fall ?Restrictions ?Weight Bearing Restrictions: No  ? ?  ? ?Mobility ? Bed Mobility ?Overal bed mobility: Modified Independent ?Bed Mobility: Supine to Sit ?  ?  ?Supine to sit: Modified independent (Device/Increase time) ?  ?  ?General bed mobility comments: assist for lines only; no physical assist or cues needed ?  ? ?Transfers ?Overall transfer level: Needs assistance ?Equipment used: None ?Transfers: Sit to/from Stand, Bed to chair/wheelchair/BSC ?Sit to Stand: Supervision ?  ?Step pivot transfers: Supervision ?  ?  ?  ?General transfer comment: supervision for lines only; pt somewhat cautious as stepping around but did not need external support ?  ? ?Ambulation/Gait ?  ?  ?  ?  ?  ?  ?  ?General Gait Details: unable due to chest tube needs to stay on suction post-procedure ? ?Stairs ?  ?  ?  ?  ?  ? ?Wheelchair Mobility ?  ? ?Modified Rankin (Stroke Patients Only) ?  ? ?  ? ?Balance Overall balance assessment: Mild deficits observed, not formally tested ?  ?  ?  ?  ?  ?  ?  ?  ?  ?  ?  ?  ?  ?  ?  ?  ?  ?  ?   ? ? ? ?Pertinent Vitals/Pain Pain Assessment ?Pain Assessment: Faces ?Faces Pain Scale: Hurts little more ?Pain Location: left chest at CT site ?Pain Descriptors / Indicators: Discomfort ?Pain Intervention(s): Limited activity within patient's tolerance, Monitored during session  ? ? ?Home Living Family/patient expects to be discharged to:: Unsure ?Living Arrangements: Children ?Available Help at Discharge: Family;Available PRN/intermittently;Friend(s);Neighbor ?Type  of Home: House ?Home Access: Stairs to enter ?Entrance Stairs-Rails: Can reach both ?Entrance Stairs-Number of Steps: 3-4 ?  ?Home Layout: One level ?Home Equipment: Kasandra Knudsen - single point ?Additional Comments: Pt reports he can no longer go to live with children. He doesn't know where he is going to stay upon discharge  ?  ?Prior Function Prior Level of Function : Independent/Modified Independent ?  ?  ?   ?  ?  ?  ?  ?  ?  ? ? ?Hand Dominance  ? Dominant Hand: Right ? ?  ?Extremity/Trunk Assessment  ? Upper Extremity Assessment ?Upper Extremity Assessment: Generalized weakness ?  ? ?Lower Extremity Assessment ?Lower Extremity Assessment: Generalized weakness ?  ? ?Cervical / Trunk Assessment ?Cervical / Trunk Assessment: Normal  ?Communication  ? Communication: No difficulties  ?Cognition Arousal/Alertness: Awake/alert ?Behavior During Therapy: Anxious ?Overall Cognitive Status: Within Functional Limits for tasks assessed ?  ?  ?  ?  ?  ?  ?  ?  ?  ?  ?  ?  ?  ?  ?  ?  ?  ?  ?  ? ?  ?General Comments General comments (skin integrity, edema, etc.): sats on 2L O2 >95% ? ?  ?Exercises    ? ?Assessment/Plan  ?  ?PT Assessment Patient needs continued PT services  ?PT Problem List Decreased knowledge of use of DME;Decreased activity tolerance;Decreased safety awareness;Decreased mobility;Cardiopulmonary status limiting activity ? ?   ?  ?PT Treatment Interventions DME instruction;Balance training;Gait training;Neuromuscular re-education;Stair training;Functional mobility training;Patient/family education;Therapeutic activities;Therapeutic exercise   ? ?PT Goals (Current goals can be found in the Care Plan section)  ?Acute Rehab PT Goals ?Patient Stated Goal: hopes daughter will find him a place to live ?PT Goal Formulation: With patient ?Time For Goal Achievement: 06/14/21 ?Potential to Achieve Goals: Good ? ?  ?Frequency Min 3X/week ?  ? ? ?Co-evaluation   ?  ?  ?  ?  ? ? ?  ?AM-PAC PT "6 Clicks" Mobility  ?Outcome Measure Help needed turning from your back to your side while in a flat bed without using bedrails?: None ?Help needed moving from lying on your back to sitting on the side of a flat bed without using bedrails?: None ?Help needed moving to and from a bed to a chair (including a wheelchair)?: A Little ?Help needed standing up from a chair using your arms (e.g., wheelchair or bedside chair)?: A Little ?Help  needed to walk in hospital room?: A Little ?Help needed climbing 3-5 steps with a railing? : A Little ?6 Click Score: 20 ? ?  ?End of Session Equipment Utilized During Treatment: Oxygen ?Activity Tolerance: Patient tolerated treatment well ?Patient left: in chair;with call bell/phone within reach ?Nurse Communication: Mobility status ?PT Visit Diagnosis: Difficulty in walking, not elsewhere classified (R26.2) ?  ? ?Time: 7001-7494 ?PT Time Calculation (min) (ACUTE ONLY): 29 min ? ? ?Charges:   PT Evaluation ?$PT Eval Low Complexity: 1 Low ?PT Treatments ?$Therapeutic Activity: 8-22 mins ?  ?   ? ? ? ?Arby Barrette, PT ?Acute Rehabilitation Services  ?Pager [phone] ?Office [phone] ? ? ?Jeanie Cooks Romello Hoehn ?05/31/2021, 2:33 PM ? ?

## 2021-05-31 NOTE — Consult Note (Signed)
? ?  NAME:  Lucas Burke, MRN:  [id number], DOB:  14-Jul-1960, LOS: 1 ?ADMISSION DATE:  05/30/2021, CONSULTATION DATE:  05/31/2021 ?REFERRING MD:  Johnell Comings MD, CHIEF COMPLAINT:  Pleural effusion, Empyema  ? ?History of Present Illness:  ? ?61 year old with history of anxiety, panic attacks, chronic pain.  Admitted to [facility] with weakness, sepsis secondary to pneumonia with large left effusion.  Thoracentesis attempted but was unsuccessful.  He ultimately had a chest tube placed by IR and transferred to [facility] for cardiothoracic evaluation. ? ?Pertinent  Medical History  ? ? has a past medical history of Anxiety, Chronic back pain, Cigarette smoker, Hypokalemia, and Panic attacks.  ? ?Significant Hospital Events: ?Including procedures, antibiotic start and stop dates in addition to other pertinent events   ?3/18 Admitted to [facility] ?3/24 Thoracentesis attempted, chest tube placement by interventional radiology ?3/26 [facility] to [name] ? ?Interim History / Subjective:  ? ? ? ?Objective   ?Blood pressure (!) 88/53, pulse 64, temperature 98.3 ?F (36.8 ?C), temperature source Oral, resp. rate 20, SpO2 95 %. ?   ?   ? ?Intake/Output Summary (Last 24 hours) at 05/31/2021 0370 ?Last data filed at 05/31/2021 0740 ?Gross per 24 hour  ?Intake --  ?Output 1650 ml  ?Net -1650 ml  ? ?There were no vitals filed for this visit. ? ?Examination: ?Blood pressure (!) 88/53, pulse 64, temperature 98.3 ?F (36.8 ?C), temperature source Oral, resp. rate 20, SpO2 95 %. ?Gen:      No acute distress ?HEENT:  EOMI, sclera anicteric ?Neck:     No masses; no thyromegaly ?Lungs:   Diminished breath sounds on left ?CV:         Regular rate and rhythm; no murmurs ?Abd:      + bowel sounds; soft, non-tender; no palpable masses, no distension ?Ext:    No edema; adequate peripheral perfusion ?Skin:      Warm and dry; no rash ?Neuro: alert and oriented x 3 ?Psych: normal mood and affect  ? ?CT chest 3/23-large left effusion with loculations, small  pockets of air.  Atelectasis of the left lung with narrowing of bronchus, mediastinal lymphadenopathy. ? ? ?Resolved Hospital Problem list   ? ? ?Assessment & Plan:  ?Pneumonia, severe sepsis ?Left empyema s/p chest tube placement ?Continue antibiotics per primary ?We will discuss with cardiothoracic surgery and attempt lytics via chest tube before VATS consideration ?CT suggests malignancy but findings could be from infection alone.  He will need bronchoscopy at some point once fluid is drained and infection adequately treated. ? ?Hypercalcemia ?IV fluids, PTHRP pending ? ?Best Practice (right click and "Reselect all SmartList Selections" daily)  ? ?Per primary team ? ?Critical care time: NA  ? ?Marshell Garfinkel MD ?[facility] ?See Amion for pager ? ?If no response to pager , please call [phone] until 7pm ?After 7:00 pm call Elink  [phone] ?05/31/2021, 8:22 AM  ? ?  ?

## 2021-06-01 ENCOUNTER — Inpatient Hospital Stay (HOSPITAL_COMMUNITY): Payer: Medicaid Other | Admitting: Certified Registered Nurse Anesthetist

## 2021-06-01 ENCOUNTER — Inpatient Hospital Stay (HOSPITAL_COMMUNITY): Payer: Medicaid Other

## 2021-06-01 ENCOUNTER — Encounter (HOSPITAL_COMMUNITY): Payer: Self-pay | Admitting: Internal Medicine

## 2021-06-01 ENCOUNTER — Encounter (HOSPITAL_COMMUNITY)
Admission: AD | Disposition: A | Discharge status: Home Health | Payer: Self-pay | Source: Other Acute Inpatient Hospital | Attending: Internal Medicine

## 2021-06-01 ENCOUNTER — Other Ambulatory Visit: Payer: Self-pay

## 2021-06-01 DIAGNOSIS — M1990 Unspecified osteoarthritis, unspecified site: Secondary | ICD-10-CM

## 2021-06-01 DIAGNOSIS — D630 Anemia in neoplastic disease: Secondary | ICD-10-CM

## 2021-06-01 DIAGNOSIS — C761 Malignant neoplasm of thorax: Secondary | ICD-10-CM

## 2021-06-01 HISTORY — PX: DECORTICATION: SHX5101

## 2021-06-01 HISTORY — PX: PLEURAL BIOPSY: SHX5082

## 2021-06-01 HISTORY — PX: VIDEO BRONCHOSCOPY: SHX5072

## 2021-06-01 HISTORY — PX: VIDEO ASSISTED THORACOSCOPY (VATS)/EMPYEMA: SHX6172

## 2021-06-01 LAB — COMPREHENSIVE METABOLIC PANEL
ALT: 62 U/L — ABNORMAL HIGH (ref 0–44)
AST: 28 U/L (ref 15–41)
Albumin: <1.5 g/dL — ABNORMAL LOW (ref 3.5–5.0)
Alkaline Phosphatase: 147 U/L — ABNORMAL HIGH (ref 38–126)
Anion gap: 7 (ref 5–15)
BUN: 20 mg/dL (ref 8–23)
CO2: 28 mmol/L (ref 22–32)
Calcium: 9.8 mg/dL (ref 8.9–10.3)
Chloride: 98 mmol/L (ref 98–111)
Creatinine, Ser: 0.87 mg/dL (ref 0.61–1.24)
GFR, Estimated: >60 mL/min (ref >60)
Glucose, Bld: 95 mg/dL (ref 70–99)
Potassium: 4.6 mmol/L (ref 3.5–5.1)
Sodium: 133 mmol/L — ABNORMAL LOW (ref 135–145)
Total Bilirubin: 0.4 mg/dL (ref 0.3–1.2)
Total Protein: 5.4 g/dL — ABNORMAL LOW (ref 6.5–8.1)

## 2021-06-01 LAB — CBC WITH DIFFERENTIAL/PLATELET
Abs Immature Granulocytes: 0.15 10*3/uL — ABNORMAL HIGH (ref 0.00–0.07)
Basophils Absolute: 0 10*3/uL (ref 0.0–0.1)
Basophils Relative: 0 %
Eosinophils Absolute: 0.3 10*3/uL (ref 0.0–0.5)
Eosinophils Relative: 2 %
HCT: 29.8 % — ABNORMAL LOW (ref 39.0–52.0)
Hemoglobin: 9.2 g/dL — ABNORMAL LOW (ref 13.0–17.0)
Immature Granulocytes: 1 %
Lymphocytes Relative: 11 %
Lymphs Abs: 1.7 10*3/uL (ref 0.7–4.0)
MCH: 25.7 pg — ABNORMAL LOW (ref 26.0–34.0)
MCHC: 30.9 g/dL (ref 30.0–36.0)
MCV: 83.2 fL (ref 80.0–100.0)
Monocytes Absolute: 1.5 10*3/uL — ABNORMAL HIGH (ref 0.1–1.0)
Monocytes Relative: 9 %
Neutro Abs: 12.1 10*3/uL — ABNORMAL HIGH (ref 1.7–7.7)
Neutrophils Relative %: 77 %
Platelets: 788 10*3/uL — ABNORMAL HIGH (ref 150–400)
RBC: 3.58 MIL/uL — ABNORMAL LOW (ref 4.22–5.81)
RDW: 13.2 % (ref 11.5–15.5)
WBC: 15.7 10*3/uL — ABNORMAL HIGH (ref 4.0–10.5)
nRBC: 0 % (ref 0.0–0.2)

## 2021-06-01 LAB — CBC
HCT: 30.7 % — ABNORMAL LOW (ref 39.0–52.0)
Hemoglobin: 9.7 g/dL — ABNORMAL LOW (ref 13.0–17.0)
MCH: 26.2 pg (ref 26.0–34.0)
MCHC: 31.6 g/dL (ref 30.0–36.0)
MCV: 83 fL (ref 80.0–100.0)
Platelets: 840 10*3/uL — ABNORMAL HIGH (ref 150–400)
RBC: 3.7 MIL/uL — ABNORMAL LOW (ref 4.22–5.81)
RDW: 13.2 % (ref 11.5–15.5)
WBC: 23.9 10*3/uL — ABNORMAL HIGH (ref 4.0–10.5)
nRBC: 0 % (ref 0.0–0.2)

## 2021-06-01 LAB — BODY FLUID CULTURE W GRAM STAIN
Culture: NO GROWTH
Gram Stain: NONE SEEN

## 2021-06-01 LAB — PROCALCITONIN: Procalcitonin: 0.4 ng/mL

## 2021-06-01 LAB — MAGNESIUM: Magnesium: 1.8 mg/dL (ref 1.7–2.4)

## 2021-06-01 LAB — C-REACTIVE PROTEIN: CRP: 11.4 mg/dL — ABNORMAL HIGH (ref <1.0)

## 2021-06-01 LAB — BRAIN NATRIURETIC PEPTIDE: B Natriuretic Peptide: 32.3 pg/mL (ref 0.0–100.0)

## 2021-06-01 SURGERY — VIDEO ASSISTED THORACOSCOPY (VATS)/EMPYEMA
Anesthesia: General | Site: Chest

## 2021-06-01 MED ORDER — EPHEDRINE SULFATE (PRESSORS) 50 MG/ML IJ SOLN
INTRAMUSCULAR | Status: DC | PRN
  Administered 2021-06-01: 5 mg via INTRAVENOUS
  Administered 2021-06-01: 10 mg via INTRAVENOUS
  Administered 2021-06-01: 5 mg via INTRAVENOUS
  Administered 2021-06-01 (×2): 10 mg via INTRAVENOUS
  Administered 2021-06-01: 5 mg via INTRAVENOUS

## 2021-06-01 MED ORDER — NALOXONE HCL 0.4 MG/ML IJ SOLN: 0.4000 mg | INTRAMUSCULAR | Status: DC | PRN

## 2021-06-01 MED ORDER — CHLORHEXIDINE GLUCONATE 0.12 % MT SOLN
OROMUCOSAL | Status: AC
  Administered 2021-06-01: 15 mL via OROMUCOSAL
  Filled 2021-06-01: qty 15

## 2021-06-01 MED ORDER — ALBUMIN HUMAN 5 % IV SOLN
12.5000 g | Freq: Once | INTRAVENOUS | Status: AC
  Administered 2021-06-01: 12.5 g via INTRAVENOUS
  Filled 2021-06-01: qty 250

## 2021-06-01 MED ORDER — DIPHENHYDRAMINE HCL 12.5 MG/5ML PO ELIX: 12.5000 mg | ORAL_SOLUTION | Freq: Four times a day (QID) | ORAL | Status: DC | PRN

## 2021-06-01 MED ORDER — ONDANSETRON HCL 4 MG/2ML IJ SOLN
INTRAMUSCULAR | Status: DC | PRN
  Administered 2021-06-01: 4 mg via INTRAVENOUS

## 2021-06-01 MED ORDER — SENNOSIDES-DOCUSATE SODIUM 8.6-50 MG PO TABS
1.0000 | ORAL_TABLET | Freq: Every day | ORAL | Status: DC
  Administered 2021-06-01 – 2021-06-04 (×4): 1 via ORAL
  Filled 2021-06-01 (×4): qty 1

## 2021-06-01 MED ORDER — MIDAZOLAM HCL 5 MG/5ML IJ SOLN
INTRAMUSCULAR | Status: DC | PRN
  Administered 2021-06-01: 2 mg via INTRAVENOUS

## 2021-06-01 MED ORDER — ALBUMIN HUMAN 5 % IV SOLN
INTRAVENOUS | Status: AC
  Administered 2021-06-01: 12.5 g
  Filled 2021-06-01: qty 500

## 2021-06-01 MED ORDER — EPINEPHRINE PF 1 MG/ML IJ SOLN
INTRAMUSCULAR | Status: DC | PRN
  Administered 2021-06-01: 1 mg via ENDOTRACHEOPULMONARY

## 2021-06-01 MED ORDER — HEMOSTATIC AGENTS (NO CHARGE) OPTIME
TOPICAL | Status: DC | PRN
  Administered 2021-06-01 (×2): 1 via TOPICAL

## 2021-06-01 MED ORDER — CHLORHEXIDINE GLUCONATE CLOTH 2 % EX PADS
6.0000 | MEDICATED_PAD | Freq: Every day | CUTANEOUS | Status: DC
  Administered 2021-06-01 – 2021-06-05 (×4): 6 via TOPICAL

## 2021-06-01 MED ORDER — MORPHINE SULFATE (PF) 2 MG/ML IV SOLN: 2.0000 mg | INTRAVENOUS | Status: DC | PRN

## 2021-06-01 MED ORDER — HYDROMORPHONE 1 MG/ML IV SOLN: INTRAVENOUS | Status: DC

## 2021-06-01 MED ORDER — ENOXAPARIN SODIUM 40 MG/0.4ML IJ SOSY
40.0000 mg | PREFILLED_SYRINGE | Freq: Every day | INTRAMUSCULAR | Status: DC
  Administered 2021-06-04 – 2021-06-08 (×5): 40 mg via SUBCUTANEOUS
  Filled 2021-06-01 (×5): qty 0.4

## 2021-06-01 MED ORDER — SODIUM CHLORIDE 0.9% FLUSH: 9.0000 mL | INTRAVENOUS | Status: DC | PRN

## 2021-06-01 MED ORDER — LIDOCAINE 2% (20 MG/ML) 5 ML SYRINGE
INTRAMUSCULAR | Status: DC | PRN
  Administered 2021-06-01: 60 mg via INTRAVENOUS

## 2021-06-01 MED ORDER — SODIUM CHLORIDE 0.9 % IV SOLN: INTRAVENOUS | Status: DC

## 2021-06-01 MED ORDER — LACTATED RINGERS IV SOLN
INTRAVENOUS | Status: AC
Start: 2021-06-01 — End: 2021-06-02

## 2021-06-01 MED ORDER — SODIUM CHLORIDE 0.9 % IV SOLN
20.0000 ug | Freq: Once | INTRAVENOUS | Status: AC
  Administered 2021-06-01: 20 ug via INTRAVENOUS
  Filled 2021-06-01: qty 5

## 2021-06-01 MED ORDER — ONDANSETRON HCL 4 MG/2ML IJ SOLN: 4.0000 mg | Freq: Four times a day (QID) | INTRAMUSCULAR | Status: DC | PRN

## 2021-06-01 MED ORDER — ACETAMINOPHEN 500 MG PO TABS: 1000.0000 mg | ORAL_TABLET | Freq: Once | ORAL | Status: DC

## 2021-06-01 MED ORDER — PROPOFOL 10 MG/ML IV BOLUS
INTRAVENOUS | Status: AC
Start: 2021-06-01 — End: ?
  Filled 2021-06-01: qty 20

## 2021-06-01 MED ORDER — ENOXAPARIN SODIUM 40 MG/0.4ML IJ SOSY: 40.0000 mg | PREFILLED_SYRINGE | Freq: Every day | INTRAMUSCULAR | Status: DC

## 2021-06-01 MED ORDER — LACTATED RINGERS IV SOLN: INTRAVENOUS | Status: DC | PRN

## 2021-06-01 MED ORDER — BUPIVACAINE HCL 0.5 % IJ SOLN
INTRAMUSCULAR | Status: DC | PRN
  Administered 2021-06-01: 10 mL

## 2021-06-01 MED ORDER — FENTANYL CITRATE (PF) 100 MCG/2ML IJ SOLN: 25.0000 ug | INTRAMUSCULAR | Status: DC | PRN

## 2021-06-01 MED ORDER — ACETAMINOPHEN 10 MG/ML IV SOLN
INTRAVENOUS | Status: AC
  Filled 2021-06-01: qty 100

## 2021-06-01 MED ORDER — KETOROLAC TROMETHAMINE 30 MG/ML IJ SOLN
INTRAMUSCULAR | Status: DC | PRN
  Administered 2021-06-01: 30 mg via INTRAVENOUS

## 2021-06-01 MED ORDER — 0.9 % SODIUM CHLORIDE (POUR BTL) OPTIME
TOPICAL | Status: DC | PRN
  Administered 2021-06-01: 2000 mL

## 2021-06-01 MED ORDER — ALBUMIN HUMAN 5 % IV SOLN: INTRAVENOUS | Status: DC | PRN

## 2021-06-01 MED ORDER — EPINEPHRINE PF 1 MG/ML IJ SOLN
INTRAMUSCULAR | Status: AC
  Filled 2021-06-01: qty 1

## 2021-06-01 MED ORDER — PROPOFOL 10 MG/ML IV BOLUS
INTRAVENOUS | Status: DC | PRN
Start: 2021-06-01 — End: 2021-06-01
  Administered 2021-06-01: 50 mg via INTRAVENOUS

## 2021-06-01 MED ORDER — ACETAMINOPHEN 500 MG PO TABS
1000.0000 mg | ORAL_TABLET | Freq: Four times a day (QID) | ORAL | Status: AC
  Administered 2021-06-01 – 2021-06-06 (×15): 1000 mg via ORAL
  Filled 2021-06-01 (×17): qty 2

## 2021-06-01 MED ORDER — PHENYLEPHRINE HCL-NACL 20-0.9 MG/250ML-% IV SOLN
0.0000 ug/min | INTRAVENOUS | Status: DC
  Administered 2021-06-01: 30 ug/min via INTRAVENOUS
  Administered 2021-06-02: 15 ug/min via INTRAVENOUS
  Filled 2021-06-01 (×2): qty 250

## 2021-06-01 MED ORDER — BISACODYL 5 MG PO TBEC
10.0000 mg | DELAYED_RELEASE_TABLET | Freq: Every day | ORAL | Status: DC
  Administered 2021-06-02 – 2021-06-08 (×4): 10 mg via ORAL
  Filled 2021-06-01 (×6): qty 2

## 2021-06-01 MED ORDER — DIPHENHYDRAMINE HCL 50 MG/ML IJ SOLN: 12.5000 mg | Freq: Four times a day (QID) | INTRAMUSCULAR | Status: DC | PRN

## 2021-06-01 MED ORDER — SUGAMMADEX SODIUM 200 MG/2ML IV SOLN
INTRAVENOUS | Status: DC | PRN
  Administered 2021-06-01: 50 mg via INTRAVENOUS
  Administered 2021-06-01: 200 mg via INTRAVENOUS

## 2021-06-01 MED ORDER — AMISULPRIDE (ANTIEMETIC) 5 MG/2ML IV SOLN: 10.0000 mg | Freq: Once | INTRAVENOUS | Status: DC | PRN

## 2021-06-01 MED ORDER — ACETAMINOPHEN 10 MG/ML IV SOLN
INTRAVENOUS | Status: DC | PRN
  Administered 2021-06-01: 1000 mg via INTRAVENOUS

## 2021-06-01 MED ORDER — DEXAMETHASONE SODIUM PHOSPHATE 10 MG/ML IJ SOLN
INTRAMUSCULAR | Status: DC | PRN
  Administered 2021-06-01: 5 mg via INTRAVENOUS

## 2021-06-01 MED ORDER — BUPIVACAINE HCL (PF) 0.5 % IJ SOLN
INTRAMUSCULAR | Status: AC
  Filled 2021-06-01: qty 10

## 2021-06-01 MED ORDER — MIDAZOLAM HCL (PF) 10 MG/2ML IJ SOLN
INTRAMUSCULAR | Status: AC
  Filled 2021-06-01: qty 2

## 2021-06-01 MED ORDER — BUPIVACAINE 0.5 % ON-Q PUMP SINGLE CATH 400 ML
INJECTION | Status: AC | PRN
Start: 2021-06-01 — End: 2021-06-01
  Administered 2021-06-01: 400 mL

## 2021-06-01 MED ORDER — CHLORHEXIDINE GLUCONATE 0.12 % MT SOLN: 15.0000 mL | Freq: Once | OROMUCOSAL | Status: AC

## 2021-06-01 MED ORDER — MIDAZOLAM HCL 2 MG/2ML IJ SOLN
INTRAMUSCULAR | Status: AC
  Filled 2021-06-01: qty 2

## 2021-06-01 MED ORDER — ALBUMIN HUMAN 5 % IV SOLN: 12.5000 g | Freq: Once | INTRAVENOUS | Status: DC

## 2021-06-01 MED ORDER — FENTANYL CITRATE (PF) 250 MCG/5ML IJ SOLN
INTRAMUSCULAR | Status: DC | PRN
  Administered 2021-06-01: 25 ug via INTRAVENOUS
  Administered 2021-06-01: 50 ug via INTRAVENOUS

## 2021-06-01 MED ORDER — BUPIVACAINE 0.5 % ON-Q PUMP SINGLE CATH 400 ML
400.0000 mL | INJECTION | Status: DC
  Filled 2021-06-01: qty 400

## 2021-06-01 MED ORDER — ACETAMINOPHEN 160 MG/5ML PO SOLN: 1000.0000 mg | Freq: Four times a day (QID) | ORAL | Status: AC

## 2021-06-01 MED ORDER — SUCCINYLCHOLINE CHLORIDE 200 MG/10ML IV SOSY
PREFILLED_SYRINGE | INTRAVENOUS | Status: DC | PRN
  Administered 2021-06-01: 100 mg via INTRAVENOUS

## 2021-06-01 MED ORDER — ROCURONIUM BROMIDE 10 MG/ML (PF) SYRINGE
PREFILLED_SYRINGE | INTRAVENOUS | Status: DC | PRN
  Administered 2021-06-01: 20 mg via INTRAVENOUS
  Administered 2021-06-01: 50 mg via INTRAVENOUS
  Administered 2021-06-01 (×4): 20 mg via INTRAVENOUS

## 2021-06-01 MED ORDER — FENTANYL CITRATE (PF) 250 MCG/5ML IJ SOLN
INTRAMUSCULAR | Status: AC
  Filled 2021-06-01: qty 5

## 2021-06-01 MED ORDER — PHENYLEPHRINE HCL-NACL 20-0.9 MG/250ML-% IV SOLN
INTRAVENOUS | Status: DC | PRN
  Administered 2021-06-01: 40 ug/min via INTRAVENOUS

## 2021-06-01 SURGICAL SUPPLY — 92 items
BAG DECANTER FOR FLEXI CONT (MISCELLANEOUS) IMPLANT
BLADE CLIPPER SURG (BLADE) ×3 IMPLANT
BLADE SURG 11 STRL SS (BLADE) ×4 IMPLANT
BRUSH CYTOL CELLEBRITY 1.5X140 (MISCELLANEOUS) ×1 IMPLANT
CANISTER SUCT 3000ML PPV (MISCELLANEOUS) ×3 IMPLANT
CATH KIT ON-Q SILVERSOAK 5 (CATHETERS) IMPLANT
CATH KIT ON-Q SILVERSOAK 5IN (CATHETERS) ×3 IMPLANT
CATH ROBINSON RED A/P 22FR (CATHETERS) IMPLANT
CATH THORACIC 28FR (CATHETERS) IMPLANT
CATH THORACIC 28FR RT ANG (CATHETERS) IMPLANT
CATH THORACIC 36FR (CATHETERS) IMPLANT
CNTNR URN SCR LID CUP LEK RST (MISCELLANEOUS) ×4 IMPLANT
CONN ST 1/4X3/8  BEN (MISCELLANEOUS) ×1
CONN ST 1/4X3/8 BEN (MISCELLANEOUS) IMPLANT
CONT SPEC 4OZ STRL OR WHT (MISCELLANEOUS) ×6
COTTONBALL LRG STERILE PKG (GAUZE/BANDAGES/DRESSINGS) IMPLANT
COVER BACK TABLE 60X90IN (DRAPES) ×3 IMPLANT
DERMABOND ADVANCED (GAUZE/BANDAGES/DRESSINGS)
DERMABOND ADVANCED .7 DNX12 (GAUZE/BANDAGES/DRESSINGS) IMPLANT
DRAIN CHANNEL 28F RND 3/8 FF (WOUND CARE) ×1 IMPLANT
DRAPE LAPAROSCOPIC ABDOMINAL (DRAPES) ×3 IMPLANT
DRAPE WARM FLUID 44X44 (DRAPES) ×3 IMPLANT
ELECT BLADE 4.0 EZ CLEAN MEGAD (MISCELLANEOUS) ×6
ELECT REM PT RETURN 9FT ADLT (ELECTROSURGICAL) ×3
ELECTRODE BLDE 4.0 EZ CLN MEGD (MISCELLANEOUS) IMPLANT
ELECTRODE REM PT RTRN 9FT ADLT (ELECTROSURGICAL) ×2 IMPLANT
FILTER STRAW FLUID ASPIR (MISCELLANEOUS) ×1 IMPLANT
FORCEPS BIOP RJ4 1.8 (CUTTING FORCEPS) IMPLANT
GAUZE 4X4 16PLY ~~LOC~~+RFID DBL (SPONGE) ×3 IMPLANT
GAUZE SPONGE 4X4 12PLY STRL (GAUZE/BANDAGES/DRESSINGS) ×3 IMPLANT
GLOVE SURG ENC MOIS LTX SZ7.5 (GLOVE) ×6 IMPLANT
GLOVE SURG ENC TEXT LTX SZ7.5 (GLOVE) ×3 IMPLANT
GLOVE SURG POLYISO LF SZ7 (GLOVE) ×2 IMPLANT
GOWN STRL REUS W/ TWL LRG LVL3 (GOWN DISPOSABLE) ×6 IMPLANT
GOWN STRL REUS W/TWL LRG LVL3 (GOWN DISPOSABLE) ×4
KIT BASIN OR (CUSTOM PROCEDURE TRAY) ×3 IMPLANT
KIT CLEAN ENDO COMPLIANCE (KITS) ×3 IMPLANT
KIT SUCTION CATH 14FR (SUCTIONS) ×4 IMPLANT
KIT TURNOVER KIT B (KITS) ×3 IMPLANT
MARKER SKIN DUAL TIP RULER LAB (MISCELLANEOUS) ×3 IMPLANT
NDL BLUNT 18X1 FOR OR ONLY (NEEDLE) IMPLANT
NEEDLE 22X1 1/2 (OR ONLY) (NEEDLE) ×1 IMPLANT
NEEDLE BLUNT 18X1 FOR OR ONLY (NEEDLE) IMPLANT
NEEDLE HYPO 22GX1.5 SAFETY (NEEDLE) IMPLANT
NS IRRIG 1000ML POUR BTL (IV SOLUTION) ×11 IMPLANT
OIL SILICONE PENTAX (PARTS (SERVICE/REPAIRS)) ×3 IMPLANT
PACK CHEST (CUSTOM PROCEDURE TRAY) ×3 IMPLANT
PAD ARMBOARD 7.5X6 YLW CONV (MISCELLANEOUS) ×6 IMPLANT
POWDER SURGICEL 3.0 GRAM (HEMOSTASIS) ×2 IMPLANT
SEALANT SURG COSEAL 4ML (VASCULAR PRODUCTS) IMPLANT
SOL ANTI FOG 6CC (MISCELLANEOUS) ×2 IMPLANT
SOLUTION ANTI FOG 6CC (MISCELLANEOUS) ×1
SPONGE T-LAP 18X18 ~~LOC~~+RFID (SPONGE) ×10 IMPLANT
SPONGE T-LAP 4X18 ~~LOC~~+RFID (SPONGE) ×5 IMPLANT
SPONGE TONSIL TAPE 1 RFD (DISPOSABLE) ×3 IMPLANT
SUT CHROMIC 3 0 SH 27 (SUTURE) IMPLANT
SUT ETHILON 3 0 PS 1 (SUTURE) IMPLANT
SUT PROLENE 3 0 SH DA (SUTURE) IMPLANT
SUT PROLENE 4 0 RB 1 (SUTURE)
SUT PROLENE 4-0 RB1 .5 CRCL 36 (SUTURE) IMPLANT
SUT PROLENE 6 0 C 1 30 (SUTURE) IMPLANT
SUT SILK  1 MH (SUTURE) ×2
SUT SILK 1 MH (SUTURE) ×4 IMPLANT
SUT SILK 1 TIES 10X30 (SUTURE) IMPLANT
SUT SILK 2 0 SH (SUTURE) ×1 IMPLANT
SUT SILK 2 0SH CR/8 30 (SUTURE) IMPLANT
SUT SILK 3 0SH CR/8 30 (SUTURE) IMPLANT
SUT VIC AB 1 CTX 18 (SUTURE) ×1 IMPLANT
SUT VIC AB 2 TP1 27 (SUTURE) ×1 IMPLANT
SUT VIC AB 2-0 CT2 18 VCP726D (SUTURE) IMPLANT
SUT VIC AB 2-0 CTX 36 (SUTURE) ×1 IMPLANT
SUT VIC AB 3-0 SH 18 (SUTURE) IMPLANT
SUT VIC AB 3-0 X1 27 (SUTURE) ×1 IMPLANT
SUT VICRYL 0 UR6 27IN ABS (SUTURE) IMPLANT
SUT VICRYL 2 TP 1 (SUTURE) IMPLANT
SWAB COLLECTION DEVICE MRSA (MISCELLANEOUS) IMPLANT
SWAB CULTURE ESWAB REG 1ML (MISCELLANEOUS) IMPLANT
SYR 10ML LL (SYRINGE) ×1 IMPLANT
SYR 20ML ECCENTRIC (SYRINGE) ×4 IMPLANT
SYR 5ML LUER SLIP (SYRINGE) ×3 IMPLANT
SYR CONTROL 10ML LL (SYRINGE) IMPLANT
SYSTEM SAHARA CHEST DRAIN ATS (WOUND CARE) ×3 IMPLANT
TAPE CLOTH SURG 4X10 WHT LF (GAUZE/BANDAGES/DRESSINGS) ×1 IMPLANT
TIP APPLICATOR SPRAY EXTEND 16 (VASCULAR PRODUCTS) IMPLANT
TOWEL GREEN STERILE (TOWEL DISPOSABLE) ×3 IMPLANT
TOWEL GREEN STERILE FF (TOWEL DISPOSABLE) ×3 IMPLANT
TRAP SPECIMEN MUCUS 40CC (MISCELLANEOUS) ×5 IMPLANT
TRAY FOLEY MTR SLVR 16FR STAT (SET/KITS/TRAYS/PACK) ×3 IMPLANT
TROCAR XCEL NON-BLD 5MMX100MML (ENDOMECHANICALS) ×1 IMPLANT
TUBE CONNECTING 20X1/4 (TUBING) ×6 IMPLANT
TUNNELER SHEATH ON-Q 11GX8 DSP (PAIN MANAGEMENT) ×1 IMPLANT
WATER STERILE IRR 1000ML POUR (IV SOLUTION) ×6 IMPLANT

## 2021-06-01 NOTE — Op Note (Signed)
NAME: Lucas Burke, Lucas A. ?MEDICAL RECORD NO: [id number] ?ACCOUNT NO: [id number] ?DATE OF BIRTH: November 24, 1960 ?FACILITY: [facility] ?LOCATION: [facility] ?PHYSICIAN: Len Childs, MD ? ?Operative Report  ? ?DATE OF PROCEDURE: 06/01/2021 ? ?OPERATION:  ?1.  Video bronchoscopy. ?2.  Left VATS (video-assisted thoracoscopic surgery) for decortication of lung, drainage of effusion and biopsy. ? ?PREOPERATIVE DIAGNOSES:  Opacification of the left hemithorax with leukocytosis consistent with pleural space infection and a fusion versus left lung atelectasis, narrowing of the left main stem bronchus by CT scan. ? ?POSTOPERATIVE DIAGNOSES:  Opacification of the left hemithorax with leukocytosis consistent with pleural space infection and a fusion versus left lung atelectasis, narrowing of the left main stem bronchus by CT scan. ? ?SURGEON:  Len Childs, MD ? ?INDICATIONS:  Enid Cutter, PA-C.  A surgical first assistant was required to perform this procedure to help with the thoracoscopic instruments, to provide general assistance. ? ?ANESTHESIA:  General by Dr. Ola Spurr. ? ?CLINICAL NOTE:  The patient had been admitted by the Pulmonary service for treatment of a large loculated appearing pleural effusion on CT scan associated with leukocytosis, fatigue, anorexia, weight loss and low-grade temperature.  He had a clinical  ?diagnosis of empyema.  A pigtail catheter was placed to partially drain the effusion.  This fluid showed white cells without bacteria and cytopathology was negative.  The patient was recommended for left VATS for drainage of the effusion and  ?decortication of the lung.  I discussed the procedure of VATS with the patient including the use of general anesthesia, the location of the surgical incisions, and the expected postoperative recovery.  I discussed with him the benefits of improved  ?breathing and resolution of the underlying possible infectious process.  I discussed the risks to him including the  risks of incisional pain, bleeding, blood transfusion requirement, persistent infection, organ failure, ventilator dependence, death.  He  ?demonstrated his understanding and agreed to proceed with surgery under what I felt was an informed consent. ? ?DESCRIPTION OF PROCEDURE:  The patient was brought from preoperative holding where informed consent was documented and the proper site of surgery was documented.  Final questions were addressed with the patient in a face-to-face encounter. ? ?The patient was brought to the operating room and placed supine on the operating table.  General anesthesia was induced.  He remained stable.  A double lumen endotracheal tube was placed by the anesthesia team.  A proper timeout was performed.  Through  ?the left lung portion of the endotracheal tube, a fiberoptic bronchoscope was passed.  This showed a cystic mass in the airway of the left main stem bronchus.  Bronchial washings were sent for cytology and culture.  Brushings were taken for cytology. ? ?The patient was then positioned in the left lateral decubitus position.  The left chest was prepped and draped after the pigtail catheter had been removed.  A proper timeout was performed.  Small incisions were made for the VATS incision including a port ? in the anterior axillary line at the fourth interspace and a working port at the mid axillary line at the sixth interspace. ? ?Initial entry into the pleural space was difficult because of thick adhesions and solid tissue filling the pleural space.  Most of this appeared to be necrotic tissue, but not infected.  There was no purulence.  The lung was adherent to the pleural space ? and it was very difficult to create any working space.  The process did not appear infectious  in the biopsy of the lung and pleura was performed as well as some of the clumps of somewhat necrotic tissue in the pleural space were sent for frozen section  ?as well.  The material was also sent for  permanent section.  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  After draining the pleural fluid as completely as possible, a 28-French Bard  ?fluted chest tube was placed and directed to the apex.  Most of the pleural space was involved with the tumor and not fluid and for that reason, a talc pleurodesis was not performed.  The chest tube was secured at the skin.  The left lung was then  ?reinsufflated as much as possible by the anesthesia team.  It was still entrapped with the tumor.  The 2 incisions were closed in layers using Vicryl in a subcuticular suture for the skin.  Chest tube was connected to an underwater seal chamber connected ? to suction.  The patient was turned supine and then was extubated by the anesthesia team and returned to the recovery room in stable condition. ? ? ?PAA ?D: 06/01/2021 3:38:12 pm T: 06/01/2021 10:41:00 pm  ?JOB: [id number]/ [id number]  ?

## 2021-06-01 NOTE — Anesthesia Procedure Notes (Signed)
Procedure Name: Intubation ?Date/Time: 06/01/2021 8:26 AM ?Performed by: Glynda Jaeger, CRNA ?Pre-anesthesia Checklist: Patient identified, Patient being monitored, Timeout performed, Emergency Drugs available and Suction available ?Patient Re-evaluated:Patient Re-evaluated prior to induction ?Oxygen Delivery Method: Circle System Utilized ?Preoxygenation: Pre-oxygenation with 100% oxygen ?Induction Type: IV induction ?Ventilation: Mask ventilation without difficulty and Oral airway inserted - appropriate to patient size ?Laryngoscope Size: Mac and 4 ?Grade View: Grade I ?Endobronchial tube: Double lumen EBT and 39 Fr ?Number of attempts: 1 ?Airway Equipment and Method: Stylet ?Placement Confirmation: ETT inserted through vocal cords under direct vision, positive ETCO2 and breath sounds checked- equal and bilateral ?Tube secured with: Tape ?Dental Injury: Teeth and Oropharynx as per pre-operative assessment  ?Comments: Intubated by Steffanie Dunn. SRNA under supervision  ? ? ? ? ?

## 2021-06-01 NOTE — Progress Notes (Signed)
RN attempted to call report x 4 times to the OR at number [phone] [facility]).  Report given to day shift RN Achilles Dunk. Achilles Dunk RN is aware to call call report to the OR and speak with Ariel ?

## 2021-06-01 NOTE — Anesthesia Postprocedure Evaluation (Signed)
Anesthesia Post Note ? ?Patient: ZACHARIE PORTNER ? ?Procedure(s) Performed: VIDEO ASSISTED THORACOSCOPY (VATS)/EMPYEMA - APPLICATION OF ON-Q PAIN PUMP (Left: Chest) ?DECORTICATION (Left: Chest) ?VIDEO BRONCHOSCOPY ?PLEURAL BIOPSY (Left: Chest) ? ?  ? ?Patient location during evaluation: PACU ?Anesthesia Type: General ?Level of consciousness: awake and alert ?Pain management: pain level controlled ?Vital Signs Assessment: post-procedure vital signs reviewed and stable ?Respiratory status: spontaneous breathing, nonlabored ventilation, respiratory function stable and patient connected to nasal cannula oxygen ?Cardiovascular status: blood pressure returned to baseline and stable ?Postop Assessment: no apparent nausea or vomiting ?Anesthetic complications: no ? ? ?No notable events documented. ? ?Last Vitals:  ?Vitals:  ? 06/01/21 1315 06/01/21 1415  ?BP: 108/67   ?Pulse:  (!) 58  ?Resp:  13  ?Temp: 36.6 ?C   ?SpO2: 96% 95%  ?  ?Last Pain:  ?Vitals:  ? 06/01/21 1415  ?TempSrc:   ?PainSc: 0-No pain  ? ? ?  ?  ?  ?  ?  ?  ? ?Suzette Battiest E ? ? ? ? ?

## 2021-06-01 NOTE — Brief Op Note (Addendum)
06/01/2021 ? ?11:59 AM ? ?PATIENT:  Bonnee Quin  61 y.o. male ? ?PRE-OPERATIVE DIAGNOSIS:  Left empyema ? ?POST-OPERATIVE DIAGNOSIS:  Non-small cell carcinomatosis left hemithorax ? ?PROCEDURE:   ?VIDEO ASSISTED THORACOSCOPY (VATS)/EMPYEMA - APPLICATION OF ON-Q PAIN PUMP (Left) ?DECORTICATION (Left) ?VIDEO BRONCHOSCOPY (N/A) ?PLEURAL BIOPSY (Left) ? ? ?FINDINGS: ?  Occluded L main bronchus on bronchoscopy ?  Pleural /lung biopsy- frozen section non-small cell cancer lung , prob squamous cell ? ?SURGEON:  Dahlia Byes, MD - Primary ? ?PHYSICIAN ASSISTANT: Roddenberry ? ?ASSISTANTS: Melody Comas, RN, Scrub Person  ? ?ANESTHESIA:   general ? ?EBL:  250 mL  ? ?BLOOD ADMINISTERED:none ? ?DRAINS:  left pleural 13fr Blake drain   ? ?LOCAL MEDICATIONS USED:  MARCAINE    ? ?SPECIMEN:  Pleural peel, pleural fluid for path, cytology, and culture ? ?DISPOSITION OF SPECIMEN:   pathology, microbiology ? ?COUNTS:  Correct ? ?DICTATION: .Dragon Dictation ? ?PLAN OF CARE: Admit to inpatient  ? ?PATIENT DISPOSITION:  PACU - hemodynamically stable. ?  ?Delay start of Pharmacological VTE agent (>24hrs) due to surgical blood loss or risk of bleeding: yes ? ?

## 2021-06-01 NOTE — Progress Notes (Signed)
OT Cancellation Note ? ?Patient Details ?Name: Lucas Burke ?MRN: [id number] ?DOB: 06/19/1960 ? ? ?Cancelled Treatment:    Reason Eval/Treat Not Completed: Medical issues which prohibited therapy.  Pt with cardiothoracic surgery today and unable to participate in therapy.  Will check back tomorrow for appropriateness from a medical standpoint to proceed with eval.   ? ?Sheleen Conchas OTR/L ?06/01/2021, 2:40 PM ?

## 2021-06-01 NOTE — Anesthesia Procedure Notes (Signed)
Central Venous Catheter Insertion ?Performed by: Suzette Battiest, MD, anesthesiologist ?Start/End3/28/2023 9:15 AM, 06/01/2021 9:30 AM ?Patient location: Pre-op. ?Preanesthetic checklist: patient identified, IV checked, site marked, risks and benefits discussed, surgical consent, monitors and equipment checked, pre-op evaluation, timeout performed and anesthesia consent ?Position: Trendelenburg ?Lidocaine 1% used for infiltration and patient sedated ?Hand hygiene performed , maximum sterile barriers used  and Seldinger technique used ?Catheter size: 8 Fr ?Total catheter length 16. ?Central line was placed.Double lumen ?Procedure performed using ultrasound guided technique. ?Ultrasound Notes:anatomy identified, needle tip was noted to be adjacent to the nerve/plexus identified, no ultrasound evidence of intravascular and/or intraneural injection and image(s) printed for medical record ?Attempts: 2 ?Following insertion, dressing applied, line sutured and Biopatch. ?Post procedure assessment: blood return through all ports ? ?Patient tolerated the procedure well with no immediate complications. ? ? ? ? ?

## 2021-06-01 NOTE — Consult Note (Deleted)
? ?  NAME:  Lucas Burke, MRN:  [id number], DOB:  [birth year], LOS: 2 ?ADMISSION DATE:  05/30/2021, CONSULTATION DATE:  05/31/2021 ?REFERRING MD:  Johnell Comings MD, CHIEF COMPLAINT:  Pleural effusion, Empyema  ? ?History of Present Illness:  ? ?61 year old with history of anxiety, panic attacks, chronic pain.  Admitted to [facility] with weakness, sepsis secondary to pneumonia with large left effusion.  Thoracentesis attempted but was unsuccessful.  He ultimately had a chest tube placed by IR and transferred to [facility] for cardiothoracic evaluation. ? ?Pertinent  Medical History  ? ? has a past medical history of Anxiety, Chronic back pain, Cigarette smoker, Hypokalemia, and Panic attacks.  ? ?Significant Hospital Events: ?Including procedures, antibiotic start and stop dates in addition to other pertinent events   ?3/18 Admitted to [facility] ?3/24 Thoracentesis attempted, chest tube placement by interventional radiology ?3/26 [facility] to [name],  ?3/27 DNAse, lytics X 1 ? ? ?Interim History / Subjective:  ? ?Given DNase, lytics lytics yesterday with poor response.  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 ?Returns to the ICU with chest tube in place.  He is on low-dose Neo-Synephrine ? ?Objective   ?Blood pressure (!) 76/66, pulse 73, temperature 97.9 ?F (36.6 ?C), resp. rate 13, height 6' (1.829 m), weight 76 kg, SpO2 94 %. ?   ?FiO2 (%):  [28 %] 28 %  ? ?Intake/Output Summary (Last 24 hours) at 06/01/2021 1757 ?Last data filed at 06/01/2021 1700 ?Gross per 24 hour  ?Intake 2450 ml  ?Output 2505 ml  ?Net -55 ml  ? ?Filed Weights  ? 05/31/21 1831  ?Weight: 76 kg  ? ? ?Examination: ?Blood pressure (!) 76/66, pulse 73, temperature 97.9 ?F (36.6 ?C), resp. rate 13, height 6' (1.829 m), weight 76 kg, SpO2 94 %. ?Gen:      No acute distress ?HEENT:  EOMI, sclera anicteric ?Neck:     No masses; no thyromegaly ?Lungs:    Clear to auscultation bilaterally; normal respiratory effort ?CV:          Regular rate and rhythm; no murmurs ?Abd:      + bowel sounds; soft, non-tender; no palpable masses, no distension ?Ext:    No edema; adequate peripheral perfusion ?Skin:      Warm and dry; no rash ?Neuro: alert and oriented x 3 ?Psych: normal mood and affect  ? ?Resolved Hospital Problem list   ? ? ?Assessment & Plan:  ?Pneumonia, severe sepsis ?S/p VATS with new diagnosis of non-small cell lung cancer ?Continue antibiotics ?Chest tube management per cardiothoracic surgery ?Wean off Neo-Synephrine ? ? ?Best Practice (right click and "Reselect all SmartList Selections" daily)  ? ?Per primary team ? ?Critical care time:  ? ?The patient is critically ill with multiple organ system failure and requires high complexity decision making for assessment and support, frequent evaluation and titration of therapies, advanced monitoring, review of radiographic studies and interpretation of complex data.  ? ?Critical Care Time devoted to patient care services, exclusive of separately billable procedures, described in this note is 35 minutes.  ? ?Marshell Garfinkel MD ?[facility] ?See Amion for pager ? ?If no response to pager , please call [phone] until 7pm ?After 7:00 pm call Elink  [phone] ?06/01/2021, 6:09 PM  ? ?  ?

## 2021-06-01 NOTE — Anesthesia Preprocedure Evaluation (Signed)
Anesthesia Evaluation  ?Patient identified by MRN, date of birth, ID band ?Patient awake ? ? ? ?Reviewed: ?Allergy & Precautions, NPO status , Patient's Chart, lab work & pertinent test results ? ?Airway ?Mallampati: II ? ?TM Distance: >3 FB ?Neck ROM: Full ? ? ? Dental ? ?(+) Dental Advisory Given ?  ?Pulmonary ?Current Smoker and Patient abstained from smoking.,  ?  ?breath sounds clear to auscultation ? ? ? ? ? ? Cardiovascular ?negative cardio ROS ? ? ?Rhythm:Regular Rate:Normal ? ? ?  ?Neuro/Psych ? Neuromuscular disease   ? GI/Hepatic ?negative GI ROS, Neg liver ROS,   ?Endo/Other  ?negative endocrine ROS ? Renal/GU ?negative Renal ROS  ? ?  ?Musculoskeletal ? ?(+) Arthritis ,  ? Abdominal ?  ?Peds ? Hematology ? ?(+) Blood dyscrasia, anemia ,   ?Anesthesia Other Findings ? ? Reproductive/Obstetrics ? ?  ? ? ? ? ? ? ? ? ? ? ? ? ? ?  ?  ? ? ? ? ? ? ? ? ?Lab Results  ?Component Value Date  ? WBC 15.7 (H) 06/01/2021  ? HGB 9.2 (L) 06/01/2021  ? HCT 29.8 (L) 06/01/2021  ? MCV 83.2 06/01/2021  ? PLT 788 (H) 06/01/2021  ? ?Lab Results  ?Component Value Date  ? CREATININE 0.87 06/01/2021  ? BUN 20 06/01/2021  ? NA 133 (L) 06/01/2021  ? K 4.6 06/01/2021  ? CL 98 06/01/2021  ? CO2 28 06/01/2021  ? ? ?Anesthesia Physical ?Anesthesia Plan ? ?ASA: 3 ? ?Anesthesia Plan: General  ? ?Post-op Pain Management: Toradol IV (intra-op)* and Ofirmev IV (intra-op)*  ? ?Induction: Intravenous ? ?PONV Risk Score and Plan: 1 and Dexamethasone, Ondansetron and Treatment may vary due to age or medical condition ? ?Airway Management Planned: Double Lumen EBT ? ?Additional Equipment: Arterial line ? ?Intra-op Plan:  ? ?Post-operative Plan: Possible Post-op intubation/ventilation ? ?Informed Consent: I have reviewed the patients History and Physical, chart, labs and discussed the procedure including the risks, benefits and alternatives for the proposed anesthesia with the patient or authorized  representative who has indicated his/her understanding and acceptance.  ? ? ? ?Dental advisory given ? ?Plan Discussed with: CRNA ? ?Anesthesia Plan Comments:   ? ? ? ? ? ? ?Anesthesia Quick Evaluation ? ?

## 2021-06-01 NOTE — Transfer of Care (Signed)
Immediate Anesthesia Transfer of Care Note ? ?Patient: Lucas Burke ? ?Procedure(s) Performed: VIDEO ASSISTED THORACOSCOPY (VATS)/EMPYEMA - APPLICATION OF ON-Q PAIN PUMP (Left: Chest) ?DECORTICATION (Left: Chest) ?VIDEO BRONCHOSCOPY ?PLEURAL BIOPSY (Left: Chest) ? ?Patient Location: PACU ? ?Anesthesia Type:General ? ?Level of Consciousness: awake, alert , oriented, patient cooperative and responds to stimulation ? ?Airway & Oxygen Therapy: Patient Spontanous Breathing and Patient connected to face mask oxygen ? ?Post-op Assessment: Report given to RN and Post -op Vital signs reviewed and stable ? ?Post vital signs: Reviewed and stable ? ?Last Vitals:  ?Vitals Value Taken Time  ?BP    ?Temp    ?Pulse 67 06/01/21 1206  ?Resp 24 06/01/21 1206  ?SpO2 97 % 06/01/21 1206  ?Vitals shown include unvalidated device data. ? ?Last Pain:  ?Vitals:  ? 06/01/21 0316  ?TempSrc: Oral  ?PainSc: 0-No pain  ?   ? ?Patients Stated Pain Goal: 3 (05/30/21 2209) ? ?Complications: No notable events documented. ?

## 2021-06-01 NOTE — Progress Notes (Signed)
?                                  PROGRESS NOTE                                             ?                                                                                                                     ?                                         ? ? Patient Demographics:  ? ? Lucas Burke, is a 61 y.o. male, DOB - [birth year], RCB:[id number] ? ?Outpatient Primary MD for the patient is [facility] [place]    LOS - 2  Admit date - 05/30/2021   ? ?No chief complaint on file. ?    ? ?Brief Narrative (HPI from H&P) history 61 year old Caucasian male with history of chronic pain, panic attacks, anxiety, tremors who presented to [facility] hospital on 05/22/2021 with generalized weakness, his work-up was suggestive of left-sided empyema requiring chest tube placement, he was seen by pulmonary critical care and IR at [facility] and then transferred to [facility] on 05/30/2021 for further evaluation by cardiothoracic surgery and pulmonary critical team here.  Of note his CT scan done at [facility] is highly suspicious for left bronchus obstruction and underlying malignancy.  He is extremely weak and cachectic. ? ? Subjective:  ? ?Patient in bed going to the OR shortly, no headache, minimal pleuritic left chest tube site discomfort, no shortness of breath, no focal weakness. ? ? Assessment  & Plan :  ? ? ?Pneumonia, severe sepsis with left-sided empyema - he was seen by pulmonary critical care and IR team at [facility] underwent left-sided chest tube placement on 05/28/2021 with minimal improvement, now transferred to [facility] on 05/30/2021, currently on Unasyn, pulmonary and cardiothoracic teams have been consulted.  Going to the OR for VATS procedure by cardiothoracic surgery on 06/01/2021. ? ?2.  CT scan done at [facility] highly suspicious for left bronchial obstruction and left-sided mediastinal adenopathy suspicious for malignancy.  Defer this management to  pulmonary critical care. ? ?3. Severe protein calorie malnutrition and cachexia.  Placed on protein supplements. ?  ?4.  Severe hypercalcemia with appropriately suppressed PTH.  Kindly see #2 above monitor calcium levels underlying malignant.  Aggressively hydrate, salmon calcitonin, may require Zometa.  Also pending PTH RP levels. ? ?5.  Weakness and deconditioning.  PT OT. ? ?6.  Iron deficiency.  On iron supplements orally. ? ?7.  Chronic neck back pain.  Supportive care. ? ?8.  Suppressed PTH  in response to hypercalcemia likely due to underlying malignancy.  Treat hypercalcemia. ? ?   ? ?Condition - Extremely Guarded ? ?Family Communication  : None present ? ?Code Status : Full ? ?Consults  : Pulmonary critical care, cardiothoracic surgery ? ?PUD Prophylaxis : PPI ? ? Procedures  :    ? ?VATS procedure 06/01/2021 by cardiothoracic surgery  ? ?CT chest done at [facility] 05/27/2021.  Large loculated left-sided pleural effusion, possible obstruction of the left bronchus with mediastinal adenopathy on the left side.   ? ?05/28/2021.  CT-guided left chest tube placement by IR at [facility]. ? ?Thyroid ultrasound.  Unremarkable. ? ?   ? ?Disposition Plan  :   ? ?Status is: Inpatient ? ?DVT Prophylaxis  :   ? ?Pneumatic SCD boots to accompany all patients to O.R. Start: 05/31/21 1652 ?enoxaparin (LOVENOX) injection 40 mg Start: 05/30/21 2200 ?  ? ?Lab Results  ?Component Value Date  ? PLT 788 (H) 06/01/2021  ? ? ?Diet :  ?Diet Order   ? ?       ?  Diet NPO time specified  Diet effective midnight       ?  ? ?  ?  ? ?  ?  ? ?Inpatient Medications ? ?Scheduled Meds: ? [MAR Hold] (feeding supplement) PROSource Plus  30 mL Oral BID BM  ? [MAR Hold] calcitonin  100 Units Intramuscular Daily  ? chlorhexidine      ? [MAR Hold] enoxaparin (LOVENOX) injection  40 mg Subcutaneous Q24H  ? [MAR Hold] feeding supplement  237 mL Oral TID BM  ? [MAR Hold] midodrine  10 mg Oral TID WC  ? [MAR Hold] multivitamin with minerals  1  tablet Oral Daily  ? [MAR Hold] pantoprazole  40 mg Oral Daily  ? [MAR Hold] PARoxetine  20 mg Oral Daily  ? [MAR Hold] sodium chloride flush  10 mL Other Q8H  ? [MAR Hold] sodium chloride flush  3 mL Intravenous Q12H  ? ?Continuous Infusions: ? [MAR Hold] ampicillin-sulbactam (UNASYN) 3 g IVPB (Mini-Bag Plus) 3 g (06/01/21 0314)  ?  ceFAZolin (ANCEF) IV    ? ?PRN Meds:.0.9 % irrigation (POUR BTL), [MAR Hold] acetaminophen **OR** [MAR Hold] acetaminophen, [MAR Hold] ALPRAZolam, [MAR Hold] alum & mag hydroxide-simeth, [MAR Hold] fentaNYL, [MAR Hold] ondansetron, [MAR Hold] oxyCODONE, [MAR Hold] polyethylene glycol, [MAR Hold] traMADol ? ?Antibiotics  :   ? ?Anti-infectives (From admission, onward)  ? ? Start     Dose/Rate Route Frequency Ordered Stop  ? 05/31/21 1651  ceFAZolin (ANCEF) IVPB 2g/100 mL premix       ? 2 g ?200 mL/hr over 30 Minutes Intravenous 30 min pre-op 05/31/21 1651    ? 05/30/21 2200  [MAR Hold]  Ampicillin-Sulbactam (UNASYN) 3 g in sodium chloride 0.9 % 100 mL IVPB        (MAR Hold since Tue 06/01/2021 at 0734.Hold Reason: Transfer to a Procedural area)  ? 3 g ?200 mL/hr over 30 Minutes Intravenous Every 6 hours 05/30/21 2044    ? ?  ? ? ? Time Spent in minutes  30 ? ? ?Lala Lund M.D on 06/01/2021 at 7:51 AM ? ?To page go to [email or web address]  ? ?[facility] -  Office  [phone] ? ?See all Orders from today for further details ? ? ? Objective:  ? ?Vitals:  ? 05/31/21 1831 05/31/21 1945 06/01/21 0000 06/01/21 0316  ?BP:  (!) 95/59 (!) 90/54 93/60  ?Pulse:  61 (!) 57 67  ?  Resp:  16 15 18   ?Temp:  98.6 ?F (37 ?C) 98.5 ?F (36.9 ?C) 97.9 ?F (36.6 ?C)  ?TempSrc:  Oral Oral Oral  ?SpO2:  91% 96% 93%  ?Weight: 76 kg     ?Height: 6' (1.829 m)     ? ? ?Wt Readings from Last 3 Encounters:  ?05/31/21 76 kg  ?05/22/21 71.4 kg  ?05/22/19 79.4 kg  ? ? ? ?Intake/Output Summary (Last 24 hours) at 06/01/2021 0751 ?Last data filed at 06/01/2021 0340 ?Gross per 24 hour  ?Intake 621.24 ml  ?Output 1060 ml   ?Net -438.76 ml  ? ? ? ?Physical Exam ? ?Middle-aged Caucasian male who appears ill and cachectic lying in hospital bed in no apparent distress, no focal deficits,  reduced left-sided breath sounds with left-sided chest tube in place. ?[place].AT,PERRAL ?Supple Neck, No JVD,   ?Symmetrical Chest wall movement,   ?RRR,No Gallops, Rubs or new Murmurs,  ?+ve B.Sounds, Abd Soft, No tenderness,   ?No Cyanosis, Clubbing or edema  ? ? ? Data Review:  ? ? ?CBC ?Recent Labs  ?Lab 05/26/21 ?0436 05/27/21 ?0429 05/28/21 ?0400 05/29/21 ?4580 05/30/21 ?9983 05/31/21 ?3825 06/01/21 ?0128  ?WBC 22.0*   < > 22.0* 16.6* 15.9* 15.4* 15.7*  ?HGB 9.9*   < > 10.1* 9.6* 10.3* 9.9* 9.2*  ?HCT 31.5*   < > 32.0* 30.1* 33.0* 30.7* 29.8*  ?PLT 683*   < > 702* 717* 768* 714* 788*  ?MCV 81.4   < > 81.0 79.8* 81.5 82.7 83.2  ?MCH 25.6*   < > 25.6* 25.5* 25.4* 26.7 25.7*  ?MCHC 31.4   < > 31.6 31.9 31.2 32.2 30.9  ?RDW 13.2   < > 13.4 13.3 13.3 13.2 13.2  ?LYMPHSABS 1.4  --   --   --   --   --  1.7  ?MONOABS 2.1*  --   --   --   --   --  1.5*  ?EOSABS 0.1  --   --   --   --   --  0.3  ?BASOSABS 0.1  --   --   --   --   --  0.0  ? < > = values in this interval not displayed.  ? ? ?Electrolytes ?Recent Labs  ?Lab 05/27/21 ?0429 05/27/21 ?2017 05/28/21 ?0400 05/28/21 ?0423 05/29/21 ?0539 05/30/21 ?7673 05/31/21 ?4193 05/31/21 ?7902 05/31/21 ?1723 06/01/21 ?0128  ?NA 133* 133*  --  133*  --  135 133*  --   --  133*  ?K 4.2 4.2  --  4.3  --  4.6 4.4  --   --  4.6  ?CL 99 99  --  99  --  99 99  --   --  98  ?CO2 28 27  --  27  --  31 28  --   --  28  ?GLUCOSE 110* 116*  --  102*  --  101* 101*  --   --  95  ?BUN 19 20  --  20  --  20 19  --   --  20  ?CREATININE 0.93 0.98  --  1.00 0.88 1.02 0.93  --   --  0.87  ?CALCIUM 10.0 9.9  --  10.0  --  10.4* 10.7*  --   --  9.8  ?AST 49*  --   --   --   --   --  44*  --   --  28  ?ALT 39  --   --   --   --   --  81*  --   --  62*  ?ALKPHOS 189*  --   --   --   --   --  162*  --   --  147*  ?BILITOT 0.6  --   --    --   --   --  0.3  --   --  0.4  ?ALBUMIN 1.6*  --  1.7*  --   --   --  <1.5*  --   --  <1.5*  ?MG 1.8  --   --   --  1.9  --   --   --   --  1.8  ?CRP  --   --   --   --   --   --   --  12.2*  --  11.4*  ?P

## 2021-06-01 NOTE — Progress Notes (Signed)
Pre Procedure note for inpatients: ?  ?Lucas Burke has been scheduled for Procedure(s): ?VIDEO ASSISTED THORACOSCOPY (VATS)/EMPYEMA (Left) ?DECORTICATION (Left) ?VIDEO BRONCHOSCOPY (N/A) today. The various methods of treatment have been discussed with the patient. After consideration of the risks, benefits and treatment options the patient has consented to the planned procedure.  ? ?The patient has been seen and labs reviewed. There are no changes in the patient?s condition to prevent proceeding with the planned procedure today. ? ?Recent labs: ? ?Lab Results  ?Component Value Date  ? WBC 15.7 (H) 06/01/2021  ? HGB 9.2 (L) 06/01/2021  ? HCT 29.8 (L) 06/01/2021  ? PLT 788 (H) 06/01/2021  ? GLUCOSE 95 06/01/2021  ? ALT 62 (H) 06/01/2021  ? AST 28 06/01/2021  ? NA 133 (L) 06/01/2021  ? K 4.6 06/01/2021  ? CL 98 06/01/2021  ? CREATININE 0.87 06/01/2021  ? BUN 20 06/01/2021  ? CO2 28 06/01/2021  ? INR 1.1 05/31/2021  ? ? ?Dahlia Byes, MD ?06/01/2021 7:53 AM ? ? ?   ?

## 2021-06-01 NOTE — Progress Notes (Signed)
? ?  NAME:  Lucas Burke, MRN:  [id number], DOB:  09-13-1960, LOS: 2 ?ADMISSION DATE:  05/30/2021, CONSULTATION DATE:  05/31/2021 ?REFERRING MD:  Johnell Comings MD, CHIEF COMPLAINT:  Pleural effusion, Empyema  ? ?History of Present Illness:  ? ?61 year old with history of anxiety, panic attacks, chronic pain.  Admitted to [facility] with weakness, sepsis secondary to pneumonia with large left effusion.  Thoracentesis attempted but was unsuccessful.  He ultimately had a chest tube placed by IR and transferred to [facility] for cardiothoracic evaluation. ? ?Pertinent  Medical History  ? ? has a past medical history of Anxiety, Chronic back pain, Cigarette smoker, Hypokalemia, and Panic attacks.  ? ?Significant Hospital Events: ?Including procedures, antibiotic start and stop dates in addition to other pertinent events   ?3/18 Admitted to [facility] ?3/24 Thoracentesis attempted, chest tube placement by interventional radiology ?3/26 Transfer to [name]  ?3/27 DNAse, lytics X 1 ? ? ?Interim History / Subjective:  ? ?Given DNase, lytics lytics yesterday with poor response.  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 ?Returns to the ICU with chest tube in place.  He is on low-dose Neo-Synephrine ? ?Objective   ?Blood pressure (!) 76/66, pulse 73, temperature 97.9 ?F (36.6 ?C), resp. rate 13, height 6' (1.829 m), weight 76 kg, SpO2 94 %. ?   ?FiO2 (%):  [28 %] 28 %  ? ?Intake/Output Summary (Last 24 hours) at 06/01/2021 1809 ?Last data filed at 06/01/2021 1700 ?Gross per 24 hour  ?Intake 2450 ml  ?Output 2505 ml  ?Net -55 ml  ? ?Filed Weights  ? 05/31/21 1831  ?Weight: 76 kg  ? ? ?Examination: ?Blood pressure (!) 76/66, pulse 73, temperature 97.9 ?F (36.6 ?C), resp. rate 13, height 6' (1.829 m), weight 76 kg, SpO2 94 %. ?Gen:      No acute distress ?HEENT:  EOMI, sclera anicteric ?Neck:     No masses; no thyromegaly ?Lungs:    Clear to auscultation bilaterally; normal respiratory effort ?CV:          Regular rate and rhythm; no murmurs ?Abd:      + bowel sounds; soft, non-tender; no palpable masses, no distension ?Ext:    No edema; adequate peripheral perfusion ?Skin:      Warm and dry; no rash ?Neuro: alert and oriented x 3 ?Psych: normal mood and affect  ? ?Resolved Hospital Problem list   ? ? ?Assessment & Plan:  ?Pneumonia, severe sepsis ?S/p VATS with new diagnosis of non-small cell lung cancer ?Continue antibiotics ?Chest tube management per cardiothoracic surgery ?He is getting IV albumin. Wean off Neo-Synephrine ? ?Best Practice (right click and "Reselect all SmartList Selections" daily)  ? ?Per primary team ? ?Critical care time:  ? ?The patient is critically ill with multiple organ system failure and requires high complexity decision making for assessment and support, frequent evaluation and titration of therapies, advanced monitoring, review of radiographic studies and interpretation of complex data.  ? ?Critical Care Time devoted to patient care services, exclusive of separately billable procedures, described in this note is 35 minutes.  ? ?Marshell Garfinkel MD ?[facility] ?See Amion for pager ? ?If no response to pager , please call [phone] until 7pm ?After 7:00 pm call Elink  [phone] ?06/01/2021, 6:09 PM  ? ?  ?

## 2021-06-01 NOTE — Anesthesia Procedure Notes (Signed)
Arterial Line Insertion ?Start/End3/28/2023 7:40 AM, 06/01/2021 7:46 PM ?Performed by: Suzette Battiest, MD, Suad Autrey Melanee Left, CRNA, CRNA ? Preanesthetic checklist: patient identified, IV checked, site marked, risks and benefits discussed, surgical consent, monitors and equipment checked, pre-op evaluation, timeout performed and anesthesia consent ?Right, radial was placed ?Catheter size: 20 G ?Hand hygiene performed  and maximum sterile barriers used  ?Allen's test indicative of satisfactory collateral circulation ?Attempts: 1 ?Procedure performed without using ultrasound guided technique. ?Following insertion, dressing applied and Biopatch. ?Post procedure assessment: normal ? ?Patient tolerated the procedure well with no immediate complications. ? ? ?

## 2021-06-01 NOTE — Progress Notes (Signed)
CT Surgery ? ?I discussed the results of the VATS biopsy showing non-small cell lung cancer with the patient with his sons present to support him. ? ?Patient on 30 mcg neo- postop Hb pending ?Pain well controlled ?

## 2021-06-01 NOTE — Progress Notes (Signed)
EVENING ROUNDS NOTE : ? ?   ?[place].[address] ?      [name] [address] ?            [phone]   ?              ?Day of Surgery ?Procedure(s) (LRB): ?VIDEO ASSISTED THORACOSCOPY (VATS)/EMPYEMA - APPLICATION OF ON-Q PAIN PUMP (Left) ?DECORTICATION (Left) ?VIDEO BRONCHOSCOPY (N/A) ?PLEURAL BIOPSY (Left) ? ? ?Total Length of Stay:  LOS: 2 days  ?Events:   ?Awake, alert ?Stable resp status ? ? ? ?BP 108/67   Pulse (!) 58   Temp 97.9 ?F (36.6 ?C)   Resp 13   Ht 6' (1.829 m)   Wt 76 kg   SpO2 95%   BMI 22.72 kg/m?  ? ?  ? ?FiO2 (%):  [28 %] 28 % ? ? sodium chloride    ? ampicillin-sulbactam (UNASYN) 3 g IVPB (Mini-Bag Plus) 3 g (06/01/21 0314)  ? bupivacaine 0.5 % ON-Q pump SINGLE CATH 400 mL    ? lactated ringers    ? phenylephrine (NEO-SYNEPHRINE) Adult infusion 25.067 mcg/min (06/01/21 1455)  ? ? ?I/O last 3 completed shifts: ?In: 621.2 [P.O.:120; I.V.:501.2] ?Out: 2710 [Urine:2250; Chest Tube:460] ? ? ? ?  Latest Ref Rng & Units 06/01/2021  ?  1:28 AM 05/31/2021  ?  2:13 AM 05/30/2021  ?  4:33 AM  ?CBC  ?WBC 4.0 - 10.5 K/uL 15.7   15.4   15.9    ?Hemoglobin 13.0 - 17.0 g/dL 9.2   9.9   10.3    ?Hematocrit 39.0 - 52.0 % 29.8   30.7   33.0    ?Platelets 150 - 400 K/uL 788   714   768    ? ? ? ?  Latest Ref Rng & Units 06/01/2021  ?  1:28 AM 05/31/2021  ?  2:13 AM 05/30/2021  ?  4:33 AM  ?BMP  ?Glucose 70 - 99 mg/dL 95   101   101    ?BUN 8 - 23 mg/dL 20   19   20     ?Creatinine 0.61 - 1.24 mg/dL 0.87   0.93   1.02    ?Sodium 135 - 145 mmol/L 133   133   135    ?Potassium 3.5 - 5.1 mmol/L 4.6   4.4   4.6    ?Chloride 98 - 111 mmol/L 98   99   99    ?CO2 22 - 32 mmol/L 28   28   31     ?Calcium 8.9 - 10.3 mg/dL 9.8   10.7   10.4    ? ? ?ABG ?   ?Component Value Date/Time  ? PHART 7.47 (H) 05/31/2021 1756  ? PCO2ART 44 05/31/2021 1756  ? PO2ART 76 (L) 05/31/2021 1756  ? HCO3 32.0 (H) 05/31/2021 1756  ? O2SAT 97.5 05/31/2021 1756  ? ? ? ? ? ?Melodie Bouillon, MD ?06/01/2021 4:18 PM ? ? ?

## 2021-06-02 ENCOUNTER — Inpatient Hospital Stay (HOSPITAL_COMMUNITY): Payer: Medicaid Other

## 2021-06-02 ENCOUNTER — Encounter (HOSPITAL_COMMUNITY): Payer: Self-pay | Admitting: Cardiothoracic Surgery

## 2021-06-02 DIAGNOSIS — C3492 Malignant neoplasm of unspecified part of left bronchus or lung: Secondary | ICD-10-CM

## 2021-06-02 DIAGNOSIS — J9601 Acute respiratory failure with hypoxia: Secondary | ICD-10-CM

## 2021-06-02 LAB — COMPREHENSIVE METABOLIC PANEL
ALT: 36 U/L (ref 0–44)
AST: 21 U/L (ref 15–41)
Albumin: 1.8 g/dL — ABNORMAL LOW (ref 3.5–5.0)
Alkaline Phosphatase: 113 U/L (ref 38–126)
Anion gap: 5 (ref 5–15)
BUN: 19 mg/dL (ref 8–23)
CO2: 28 mmol/L (ref 22–32)
Calcium: 10.1 mg/dL (ref 8.9–10.3)
Chloride: 102 mmol/L (ref 98–111)
Creatinine, Ser: 0.92 mg/dL (ref 0.61–1.24)
GFR, Estimated: >60 mL/min (ref >60)
Glucose, Bld: 139 mg/dL — ABNORMAL HIGH (ref 70–99)
Potassium: 4.5 mmol/L (ref 3.5–5.1)
Sodium: 135 mmol/L (ref 135–145)
Total Bilirubin: 0.3 mg/dL (ref 0.3–1.2)
Total Protein: 5 g/dL — ABNORMAL LOW (ref 6.5–8.1)

## 2021-06-02 LAB — CBC WITH DIFFERENTIAL/PLATELET
Abs Immature Granulocytes: 0.15 K/uL — ABNORMAL HIGH (ref 0.00–0.07)
Basophils Absolute: 0 K/uL (ref 0.0–0.1)
Basophils Relative: 0 %
Eosinophils Absolute: 0 K/uL (ref 0.0–0.5)
Eosinophils Relative: 0 %
HCT: 25.9 % — ABNORMAL LOW (ref 39.0–52.0)
Hemoglobin: 7.8 g/dL — ABNORMAL LOW (ref 13.0–17.0)
Immature Granulocytes: 1 %
Lymphocytes Relative: 7 %
Lymphs Abs: 1.3 K/uL (ref 0.7–4.0)
MCH: 25.5 pg — ABNORMAL LOW (ref 26.0–34.0)
MCHC: 30.1 g/dL (ref 30.0–36.0)
MCV: 84.6 fL (ref 80.0–100.0)
Monocytes Absolute: 1.7 K/uL — ABNORMAL HIGH (ref 0.1–1.0)
Monocytes Relative: 10 %
Neutro Abs: 14.4 K/uL — ABNORMAL HIGH (ref 1.7–7.7)
Neutrophils Relative %: 82 %
Platelets: 701 K/uL — ABNORMAL HIGH (ref 150–400)
RBC: 3.06 MIL/uL — ABNORMAL LOW (ref 4.22–5.81)
RDW: 13.3 % (ref 11.5–15.5)
WBC: 17.5 K/uL — ABNORMAL HIGH (ref 4.0–10.5)
nRBC: 0 % (ref 0.0–0.2)

## 2021-06-02 LAB — POCT I-STAT 7, (LYTES, BLD GAS, ICA,H+H)
Acid-Base Excess: 4 mmol/L — ABNORMAL HIGH (ref 0.0–2.0)
Bicarbonate: 29 mmol/L — ABNORMAL HIGH (ref 20.0–28.0)
Calcium, Ion: 1.51 mmol/L (ref 1.15–1.40)
HCT: 24 % — ABNORMAL LOW (ref 39.0–52.0)
Hemoglobin: 8.2 g/dL — ABNORMAL LOW (ref 13.0–17.0)
O2 Saturation: 92 %
Patient temperature: 98.3
Potassium: 4.6 mmol/L (ref 3.5–5.1)
Sodium: 136 mmol/L (ref 135–145)
TCO2: 30 mmol/L (ref 22–32)
pCO2 arterial: 43.3 mmHg (ref 32–48)
pH, Arterial: 7.433 (ref 7.35–7.45)
pO2, Arterial: 61 mmHg — ABNORMAL LOW (ref 83–108)

## 2021-06-02 LAB — C-REACTIVE PROTEIN: CRP: 9.2 mg/dL — ABNORMAL HIGH (ref <1.0)

## 2021-06-02 LAB — CYTOLOGY - NON PAP

## 2021-06-02 LAB — SURGICAL PATHOLOGY

## 2021-06-02 LAB — PROCALCITONIN: Procalcitonin: 0.27 ng/mL

## 2021-06-02 LAB — MAGNESIUM: Magnesium: 1.7 mg/dL (ref 1.7–2.4)

## 2021-06-02 LAB — BRAIN NATRIURETIC PEPTIDE: B Natriuretic Peptide: 513.7 pg/mL — ABNORMAL HIGH (ref 0.0–100.0)

## 2021-06-02 LAB — PREPARE RBC (CROSSMATCH)

## 2021-06-02 MED ORDER — FUROSEMIDE 10 MG/ML IJ SOLN
40.0000 mg | Freq: Three times a day (TID) | INTRAMUSCULAR | Status: DC
  Administered 2021-06-02 – 2021-06-05 (×9): 40 mg via INTRAVENOUS
  Filled 2021-06-02 (×9): qty 4

## 2021-06-02 MED ORDER — SODIUM CHLORIDE 0.9 % IV SOLN: INTRAVENOUS | Status: DC

## 2021-06-02 MED ORDER — LORAZEPAM 2 MG/ML IJ SOLN
1.0000 mg | Freq: Once | INTRAMUSCULAR | Status: AC
  Administered 2021-06-02: 1 mg via INTRAVENOUS
  Filled 2021-06-02: qty 1

## 2021-06-02 MED ORDER — ZOLEDRONIC ACID 4 MG/100ML IV SOLN
4.0000 mg | Freq: Once | INTRAVENOUS | Status: DC
Start: 2021-06-02 — End: 2021-06-02

## 2021-06-02 MED ORDER — SODIUM CHLORIDE 0.9 % IV SOLN
510.0000 mg | Freq: Once | INTRAVENOUS | Status: AC
  Administered 2021-06-02: 510 mg via INTRAVENOUS
  Filled 2021-06-02: qty 17

## 2021-06-02 MED ORDER — LACTATED RINGERS IV SOLN: INTRAVENOUS | Status: DC

## 2021-06-02 NOTE — Consult Note (Addendum)
Referral MD ? 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 ?No chief complaint on file. ?: I cannot breathe. ? ?HPI: Lucas Burke is a very nice 61 year old white male.  He used to work in Information systems manager.  He had to stop about 9 years ago because of back problems that he had back surgery.  He thinks that there might be a space exposure. ? ?He was a heavy tobacco user.  He probably has about a 80-pack-year history of tobacco use.  He stopped about 3 months ago. ? ?He is lost about 40 pounds over the past 4 months. ? ?He began to have more breathing difficulties.  He had no hemoptysis.  He had a cough that was would not productive.  He was very weak.  He was not eating. ? ?He apparently has a very difficult living situation.  He lives with 3 of his children.  The youngest is 52 years old. ? ?He apparently went to the emergency room at [facility].  This was a couple weeks ago.  He was found to have a large left pleural effusion.  He was found to have a calcium of 12.7. ? ?He subsequently was transferred over to [facility] on 26 March.  He had a CT scan done.  This showed a large left pleural effusion with loculations.  He had come almost complete atelectasis of the left lung.  There is narrowing of the left main bronchus.  He had a large lymph nodes in the mediastinum . ? ?Lab work that had done when he came over showed a sodium 135.  Potassium 4.6.  BUN 20 creatinine 1.02.  Calcium was 10.4.  His albumin was less than 1.5.  His alkaline phosphatase was 162.  His white cell count was 15.9.  Hemoglobin 10.3.  Platelet count 768,000. ? ?He ultimately underwent a left VATS procedure.  This was done on 05/25/2027.  Biopsies were taken.  Fluid was removed.  Unfortunately, he did have a bronchoscopy done at that time.  There is noted to be a nonobstructing lesion in the left mainstem bronchus.  This was biopsied.  The pathology report ?([id number])  showed moderately to poorly differentiated squamous cell cancer.  He had cytology taken from the bronchus.  This showed atypical cells.  He has cytology taken from the left pleural effusion and this showed malignant non-small cell lung cancer cells. ? ?By staging, he would be considered a stage IVa ([id number]) ? ?He has a chest tube in right now. ? ?He does not have the best performance status in the world.  However, he certainly is quite motivated to try to get better. ? ?Currently, I would say his performance status is probably ECOG 2. ? ? ?Past Medical History:  ?Diagnosis Date  ? Anxiety   ? Chronic back pain   ? Cigarette smoker   ? Hypokalemia   ? Panic attacks   ?: ? ? ?Past Surgical History:  ?Procedure Laterality Date  ? COLONOSCOPY  2010  ? DECORTICATION Left 06/01/2021  ? Procedure: DECORTICATION;  Surgeon: Dahlia Byes, MD;  Location: [place];  Service: Thoracic;  Laterality: Left;  ? PLEURAL BIOPSY Left 06/01/2021  ? Procedure: PLEURAL BIOPSY;  Surgeon: Dahlia Byes, MD;  Location: [place];  Service: Thoracic;  Laterality: Left;  ? VIDEO ASSISTED THORACOSCOPY (VATS)/EMPYEMA Left 06/01/2021  ? Procedure: VIDEO ASSISTED THORACOSCOPY (VATS)/EMPYEMA - APPLICATION OF ON-Q PAIN PUMP;  Surgeon: Dahlia Byes, MD;  Location:  [facility] OR;  Service: Thoracic;  Laterality: Left;  ? VIDEO BRONCHOSCOPY N/A 06/01/2021  ? Procedure: VIDEO BRONCHOSCOPY;  Surgeon: Dahlia Byes, MD;  Location: [place];  Service: Thoracic;  Laterality: N/A;  ?: ? ? ?Current Facility-Administered Medications:  ?  (feeding supplement) PROSource Plus liquid 30 mL, 30 mL, Oral, BID BM, Roddenberry, Myron G, PA-C, 30 mL at 06/02/21 1305 ?  0.9 %  sodium chloride infusion, , Intravenous, Continuous, Julian Hy, DO, Last Rate: 150 mL/hr at 06/02/21 1600, Infusion Verify at 06/02/21 1600 ?  acetaminophen (TYLENOL) tablet 1,000 mg, 1,000 mg, Oral, Q6H, 1,000 mg at 06/02/21 1112 **OR** acetaminophen (TYLENOL) 160 MG/5ML solution 1,000 mg, 1,000 mg,  Oral, Q6H, Roddenberry, Myron G, PA-C ?  acetaminophen (TYLENOL) tablet 650 mg, 650 mg, Oral, Q6H PRN, 650 mg at 06/01/21 2000 **OR** acetaminophen (TYLENOL) suppository 650 mg, 650 mg, Rectal, Q6H PRN, Antony Odea, PA-C ?  ALPRAZolam Duanne Moron) tablet 0.25 mg, 0.25 mg, Oral, TID PRN, Antony Odea, PA-C, 0.25 mg at 05/31/21 1655 ?  alum & mag hydroxide-simeth (MAALOX/MYLANTA) 200-200-20 MG/5ML suspension 30 mL, 30 mL, Oral, Q4H PRN, Roddenberry, Arlis Porta, PA-C ?  Ampicillin-Sulbactam (UNASYN) 3 g in sodium chloride 0.9 % 100 mL IVPB, 3 g, Intravenous, Q6H, Roddenberry, Arlis Porta, PA-C, Stopped at 06/02/21 1542 ?  bisacodyl (DULCOLAX) EC tablet 10 mg, 10 mg, Oral, Daily, Roddenberry, Myron G, PA-C, 10 mg at 06/02/21 5625 ?  bupivacaine 0.5 % ON-Q pump SINGLE CATH 400 mL, 400 mL, Other, Continuous, Roddenberry, Myron G, PA-C ?  calcitonin (MIACALCIN) injection 100 Units, 100 Units, Intramuscular, Daily, Antony Odea, PA-C, 100 Units at 06/02/21 1104 ?  Chlorhexidine Gluconate Cloth 2 % PADS 6 each, 6 each, Topical, Daily, Thurnell Lose, MD, 6 each at 06/02/21 1300 ?  [START ON 06/03/2021] enoxaparin (LOVENOX) injection 40 mg, 40 mg, Subcutaneous, Daily **AND** [COMPLETED] no pharmacologic VTE prophylaxis, , , Once, Roddenberry, Myron G, PA-C ?  feeding supplement (ENSURE ENLIVE / ENSURE PLUS) liquid 237 mL, 237 mL, Oral, TID BM, Roddenberry, Myron G, PA-C, 237 mL at 06/02/21 0934 ?  fentaNYL (SUBLIMAZE) injection 12.5 mcg, 12.5 mcg, Intravenous, Q2H PRN, Roddenberry, Myron G, PA-C ?  furosemide (LASIX) injection 40 mg, 40 mg, Intravenous, Q8H, Julian Hy, DO, 40 mg at 06/02/21 1305 ?  midodrine (PROAMATINE) tablet 10 mg, 10 mg, Oral, TID WC, Roddenberry, Myron G, PA-C, 10 mg at 06/02/21 1112 ?  morphine (PF) 2 MG/ML injection 2 mg, 2 mg, Intravenous, Q4H PRN, Roddenberry, Myron G, PA-C ?  morphine (PF) 2 MG/ML injection 2 mg, 2 mg, Intravenous, Q2H PRN, Roddenberry, Myron G, PA-C ?   multivitamin with minerals tablet 1 tablet, 1 tablet, Oral, Daily, Roddenberry, Myron G, PA-C, 1 tablet at 06/02/21 6389 ?  ondansetron (ZOFRAN) injection 4 mg, 4 mg, Intravenous, Q6H PRN, Roddenberry, Myron G, PA-C ?  ondansetron (ZOFRAN) tablet 4 mg, 4 mg, Oral, Q6H PRN, Roddenberry, Myron G, PA-C ?  oxyCODONE (Oxy IR/ROXICODONE) immediate release tablet 5-10 mg, 5-10 mg, Oral, Q6H PRN, Roddenberry, Myron G, PA-C, 10 mg at 06/02/21 1602 ?  pantoprazole (PROTONIX) EC tablet 40 mg, 40 mg, Oral, Daily, Roddenberry, Arlis Porta, PA-C, 40 mg at 06/02/21 3734 ?  PARoxetine (PAXIL) tablet 20 mg, 20 mg, Oral, Daily, Roddenberry, Myron G, PA-C, 20 mg at 06/02/21 2876 ?  polyethylene glycol (MIRALAX / GLYCOLAX) packet 17 g, 17 g, Oral, Daily PRN, Roddenberry, Myron G, PA-C ?  senna-docusate (Senokot-S) tablet 1 tablet, 1  tablet, Oral, QHS, Antony Odea, PA-C, 1 tablet at 06/01/21 2229 ?  sodium chloride flush (NS) 0.9 % injection 3 mL, 3 mL, Intravenous, Q12H, Roddenberry, Myron G, PA-C, 3 mL at 06/02/21 6553 ?  traMADol (ULTRAM) tablet 100 mg, 100 mg, Oral, Q6H PRN, Antony Odea, PA-C, 100 mg at 06/02/21 1305: ? ? (feeding supplement) PROSource Plus  30 mL Oral BID BM  ? acetaminophen  1,000 mg Oral Q6H  ? Or  ? acetaminophen (TYLENOL) oral liquid 160 mg/5 mL  1,000 mg Oral Q6H  ? bisacodyl  10 mg Oral Daily  ? calcitonin  100 Units Intramuscular Daily  ? Chlorhexidine Gluconate Cloth  6 each Topical Daily  ? [START ON 06/03/2021] enoxaparin (LOVENOX) injection  40 mg Subcutaneous Daily  ? feeding supplement  237 mL Oral TID BM  ? furosemide  40 mg Intravenous Q8H  ? midodrine  10 mg Oral TID WC  ? multivitamin with minerals  1 tablet Oral Daily  ? pantoprazole  40 mg Oral Daily  ? PARoxetine  20 mg Oral Daily  ? senna-docusate  1 tablet Oral QHS  ? sodium chloride flush  3 mL Intravenous Q12H  ?: ? ? ?Allergies  ?Allergen Reactions  ? Codeine Other (See Comments)  ?  Critical  ?: ? ?History reviewed. No  pertinent family history.: ? ? ?Social History  ? ?Socioeconomic History  ? Marital status: Married  ?  Spouse name: Not on file  ? Number of children: Not on file  ? Years of education: Not on file  ? Highest education le

## 2021-06-02 NOTE — Progress Notes (Signed)
?                                  PROGRESS NOTE                                             ?                                                                                                                     ?                                         ? ? Patient Demographics:  ? ? Lucas Burke, is a 61 y.o. male, DOB - 1960/11/03, CNO:[id number] ? ?Outpatient Primary MD for the patient is [facility]    LOS - 3  Admit date - 05/30/2021   ? ?No chief complaint on file. ?    ? ?Brief Narrative (HPI from H&P) history 61 year old Caucasian male with history of chronic pain, panic attacks, anxiety, tremors who presented to [facility] hospital on 05/22/2021 with generalized weakness, his work-up was suggestive of left-sided empyema requiring chest tube placement, he was seen by pulmonary critical care and IR at [facility] and then transferred to [facility] on 05/30/2021 for further evaluation by cardiothoracic surgery and pulmonary critical team here.  Of note his CT scan done at [facility] is highly suspicious for left bronchus obstruction and underlying malignancy.  He is extremely weak and cachectic. ? ? Subjective:  ? ?Patient in bed, appears comfortable, denies any headache, no fever, mild left-sided chest tube site pleuritic chest pain, no shortness of breath , no abdominal pain. No new focal weakness. ? ? ? Assessment  & Plan :  ? ? ?Pneumonia, severe sepsis with left-sided empyema - he was seen by pulmonary critical care and IR team at [facility] underwent left-sided chest tube placement on 05/28/2021 with minimal improvement, now transferred to [facility] on 05/30/2021, currently on Unasyn, and by pulmonary and cardiothoracic surgery, s/p VATS procedure on 06/01/2021.  Chest tube in place, also question of non-small cell cancer on biopsies obtained during the VATS procedure and bronchoscopy.  Follow cultures. ? ?2.  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  Will involve oncology. ? ?3. Severe protein calorie malnutrition and cachexia.  Placed on protein supplements. ?  ?4.  Severe hypercalcemia with appropriately suppressed PTH.  Stable ACE levels, stable vitamin D 25 and calcitriol levels, has been aggressively hydrated and given 2 doses of IV Salmon calcitonin but still has severe hypercalcemia will  go ahead and give him a dose of Zometa on 06/02/2021.  Pending PTH RP levels. ? ?5.  Weakness and deconditioning.  PT OT. ? ?6.  Iron deficiency.  On iron supplements orally. ? ?7.  Chronic neck back pain.  Supportive care. ? ?8.  Suppressed PTH in response to hypercalcemia likely due to underlying malignancy.  Treat hypercalcemia. ? ?   ? ?Condition - Extremely Guarded ? ?Family Communication  : None present ? ?Code Status : Full ? ?Consults  : Pulmonary critical care, cardiothoracic surgery ? ?PUD Prophylaxis : PPI ? ? Procedures  :    ? ?VATS procedure 06/01/2021 by cardiothoracic surgery  ? ?CT chest done at [facility] 05/27/2021.  Large loculated left-sided pleural effusion, possible obstruction of the left bronchus with mediastinal adenopathy on the left side.   ? ?05/28/2021.  CT-guided left chest tube placement by IR at [facility]. ? ?Thyroid ultrasound.  Unremarkable. ? ?   ? ?Disposition Plan  :   ? ?Status is: Inpatient ? ?DVT Prophylaxis  :   ? ?enoxaparin (LOVENOX) injection 40 mg Start: 06/03/21 1000 ?SCD's Start: 06/01/21 1557 ?  ? ?Lab Results  ?Component Value Date  ? PLT 701 (H) 06/02/2021  ? ? ?Diet :  ?Diet Order   ? ?       ?  Diet regular Room service appropriate? Yes; Fluid consistency: Thin  Diet effective now       ?  ? ?  ?  ? ?  ?  ? ?Inpatient Medications ? ?Scheduled Meds: ? (feeding supplement) PROSource Plus  30 mL Oral BID BM  ? acetaminophen  1,000 mg  Oral Q6H  ? Or  ? acetaminophen (TYLENOL) oral liquid 160 mg/5 mL  1,000 mg Oral Q6H  ? bisacodyl  10 mg Oral Daily  ? calcitonin  100 Units Intramuscular Daily  ? Chlorhexidine Gluconate Cloth  6 each Topical Daily  ? [START ON 06/03/2021] enoxaparin (LOVENOX) injection  40 mg Subcutaneous Daily  ? feeding supplement  237 mL Oral TID BM  ? midodrine  10 mg Oral TID WC  ? multivitamin with minerals  1 tablet Oral Daily  ? pantoprazole  40 mg Oral Daily  ? PARoxetine  20 mg Oral Daily  ? senna-docusate  1 tablet Oral QHS  ? sodium chloride flush  3 mL Intravenous Q12H  ? zoledronic acid (ZOMETA) IVPB Oncology  4 mg Intravenous Once  ? ?Continuous Infusions: ? ampicillin-sulbactam (UNASYN) 3 g IVPB (Mini-Bag Plus) Stopped (06/02/21 0436)  ? bupivacaine 0.5 % ON-Q pump SINGLE CATH 400 mL    ? lactated ringers    ? ?PRN Meds:.acetaminophen **OR** acetaminophen, ALPRAZolam, alum & mag hydroxide-simeth, fentaNYL, morphine injection, morphine injection, ondansetron (ZOFRAN) IV, ondansetron, oxyCODONE, polyethylene glycol, traMADol ? ?Antibiotics  :   ? ?Anti-infectives (From admission, onward)  ? ? Start     Dose/Rate Route Frequency Ordered Stop  ? 05/31/21 1651  ceFAZolin (ANCEF) IVPB 2g/100 mL premix       ? 2 g ?200 mL/hr over 30 Minutes Intravenous 30 min pre-op 05/31/21 1651 06/01/21 0842  ? 05/30/21 2200  Ampicillin-Sulbactam (UNASYN) 3 g in sodium chloride 0.9 % 100 mL IVPB       ? 3 g ?200 mL/hr over 30 Minutes Intravenous Every 6 hours 05/30/21 2044    ? ?  ? ? ? Time Spent in minutes  30 ? ? ?Lala Lund M.D on 06/02/2021 at 9:08 AM ? ?To page go to [email or web address]  ? ?  [facility] -  Office  [phone] ? ?See all Orders from today for further details ? ? ? Objective:  ? ?Vitals:  ? 06/02/21 0815 06/02/21 0820 06/02/21 0830 06/02/21 0836  ?BP:   116/89   ?Pulse: 71 72 74 75  ?Resp: 17 17 18 13   ?Temp:  98.3 ?F (36.8 ?C)  98.5 ?F (36.9 ?C)  ?TempSrc:  Oral  Oral  ?SpO2: 96% 91% 94% 97%  ?Weight:       ?Height:      ? ? ?Wt Readings from Last 3 Encounters:  ?05/31/21 76 kg  ?05/22/21 71.4 kg  ?05/22/19 79.4 kg  ? ? ? ?Intake/Output Summary (Last 24 hours) at 06/02/2021 0908 ?Last data filed at 06/02/2021 0800 ?Gross per 24 hour  ?Intake 3408.14 ml  ?Output 2410 ml  ?Net 998.14 ml  ? ? ? ?Physical Exam ? ?Middle-aged Caucasian male who appears ill and cachectic lying in hospital bed in no apparent distress, no focal deficits,  reduced left-sided breath sounds with left-sided chest tube in place. ?Ona.AT,PERRAL ?Supple Neck, No JVD,   ?Symmetrical Chest wall movement, Good air movement bilaterally, CTAB ?RRR,No Gallops, Rubs or new Murmurs,  ?+ve B.Sounds, Abd Soft, No tenderness,   ?No Cyanosis, Clubbing or edema  ? ? ? ? Data Review:  ? ? ?CBC ?Recent Labs  ?Lab 05/30/21 ?0433 05/31/21 ?0213 06/01/21 ?0128 06/01/21 ?1634 06/02/21 ?0422 06/02/21 ?0076  ?WBC 15.9* 15.4* 15.7* 23.9*  --  17.5*  ?HGB 10.3* 9.9* 9.2* 9.7* 8.2* 7.8*  ?HCT 33.0* 30.7* 29.8* 30.7* 24.0* 25.9*  ?PLT 768* 714* 788* 840*  --  701*  ?MCV 81.5 82.7 83.2 83.0  --  84.6  ?MCH 25.4* 26.7 25.7* 26.2  --  25.5*  ?MCHC 31.2 32.2 30.9 31.6  --  30.1  ?RDW 13.3 13.2 13.2 13.2  --  13.3  ?LYMPHSABS  --   --  1.7  --   --  1.3  ?MONOABS  --   --  1.5*  --   --  1.7*  ?EOSABS  --   --  0.3  --   --  0.0  ?BASOSABS  --   --  0.0  --   --  0.0  ? ? ?Electrolytes ?Recent Labs  ?Lab 05/27/21 ?0429 05/27/21 ?2017 05/28/21 ?0400 05/28/21 ?0423 05/29/21 ?2263 05/30/21 ?3354 05/31/21 ?5625 05/31/21 ?6389 05/31/21 ?1723 06/01/21 ?0128 06/02/21 ?0422 06/02/21 ?3734  ?NA 133*   < >  --  133*  --  135 133*  --   --  133* 136 135  ?K 4.2   < >  --  4.3  --  4.6 4.4  --   --  4.6 4.6 4.5  ?CL 99   < >  --  99  --  99 99  --   --  98  --  102  ?CO2 28   < >  --  27  --  31 28  --   --  28  --  28  ?GLUCOSE 110*   < >  --  102*  --  101* 101*  --   --  95  --  139*  ?BUN 19   < >  --  20  --  20 19  --   --  20  --  19  ?CREATININE 0.93   < >  --  1.00 0.88 1.02 0.93  --    --  0.87  --  0.92  ?  CALCIUM 10.0   < >  --  10.0  --  10.4* 10.7*  --   --  9.8  --  10.1  ?AST 49*  --   --   --   --   --  44*  --   --  28  --  21  ?ALT 39  --   --   --   --   --  81*  --   --  62*  --  36

## 2021-06-02 NOTE — Progress Notes (Signed)
? ?NAME:  Lucas Burke, MRN:  [id number], DOB:  1960-05-08, LOS: 3 ?ADMISSION DATE:  05/30/2021, CONSULTATION DATE:  05/31/2021 ?REFERRING MD:  Johnell Comings MD, CHIEF COMPLAINT:  Pleural effusion, Empyema  ? ?History of Present Illness:  ? ?61 year old with history of anxiety, panic attacks, chronic pain.  Admitted to [facility] with weakness, sepsis secondary to pneumonia with large left effusion.  Thoracentesis attempted but was unsuccessful.  He ultimately had a chest tube placed by IR and transferred to [facility] for cardiothoracic evaluation. ? ?Pertinent  Medical History  ? ? has a past medical history of Anxiety, Chronic back pain, Cigarette smoker, Hypokalemia, and Panic attacks.  ? ?Significant Hospital Events: ?Including procedures, antibiotic start and stop dates in addition to other pertinent events   ?3/18 Admitted to [facility] ?3/24 Thoracentesis attempted, chest tube placement by interventional radiology ?3/26 Transfer to Lucas Burke,  ?3/27 DNAse, lytics X 1 ?3/28 VATS ? ?Interim History / Subjective:  ?Remains on low-dose neosynephrine since VATS yesterday for failed response to intrapleural lytics. Biopsy during surgery prelim consistent with  NSCLC.  This morning he has been out of bed walking. He still has some pain but otherwise feels well.  ? ?Objective   ?Blood pressure 116/89, pulse 75, temperature 98.5 ?F (36.9 ?C), temperature source Oral, resp. rate 13, height 6' (1.829 m), weight 76 kg, SpO2 97 %. ?   ?FiO2 (%):  [28 %] 28 %  ? ?Intake/Output Summary (Last 24 hours) at 06/02/2021 0856 ?Last data filed at 06/02/2021 0800 ?Gross per 24 hour  ?Intake 3658.14 ml  ?Output 2510 ml  ?Net 1148.14 ml  ? ? ?Filed Weights  ? 05/31/21 1831  ?Weight: 76 kg  ? ? ?Examination: ?BP 116/89   Pulse 75   Temp 98.5 ?F (36.9 ?C) (Oral)   Resp 13   Ht 6' (1.829 m)   Wt 76 kg   SpO2 97%   BMI 22.72 kg/m?  ? ?Gen:      Chronically ill appearing man lying in bed in NAD ?HEENT:  [place]/AT, eyes anicteric ?Neck:     [facility]  CVC ?Lungs:    breathing comfortably on [place], decreased left sided breath sounds, bloody drainage from chest tube. No air leak. ?CV:         S1S2, RRR ?Abd:      Soft, NT ?Ext:    Minimal pedal edema, no pretibial edema. No cyanosis. ?Skin:       warm, dry ?Neuro:  awake and alert, globally weak, moving all extremities. Slightly forgetful but better historian when prompted by his nurse ?Psych: cooperative, normal affect ? ?Ionized calcium 1.51 ?BUN 19 ?Cr 0.92 ?Corrected calcium 11.9 ? ?Resolved Hospital Problem list   ? ? ?Assessment & Plan:  ?Acute respiratory failure with hypoxia due to pneumonia, empyema ?S/p VATS with new diagnosis of non-small cell lung cancer ?-con't empiric antibiotics, follow cultures ?-con't chest tube management per TCTS ?-wean O2 as able ? ?Sepsis due to pneumonia and empyema ?-antibiotics ?-midodrine per TCTS ?-chest tube management per TCTS ? ?NSCLC ?-Oncology consulted ? ?Hypercalcemia due to malignancy ?-PTHrp pending ?-fluids switched to NS, rate increased to accommodate addition of lasix to help with renal excretion of Ca+  ?-does not meet hospital threshold for zolendronic acid ?-con't calcitonin ? ?Thrombocytosis due to infection, malignancy ?-monitor ?-dvt prophylaxis per TCTS ? ?Anemia ?-transfuse for Hb <7 or hemodynamically significant bleeding ?> getting 1 unit pRBCs today ? ? ?Best Practice (right click and "Reselect all SmartList Selections" daily)  ?  DVT- lovenox to start 3/30 ?No GI prophylaxis needed ? ?Critical care time:  ? ? ?Julian Hy, DO 06/02/21 9:29 AM ?[facility] ? ?  ?

## 2021-06-02 NOTE — Evaluation (Signed)
Occupational Therapy Evaluation ?Patient Details ?Name: Lucas Burke ?MRN: [id number] ?DOB: [birth year] ?Today's Date: 06/02/2021 ? ? ?History of Present Illness 61 y.o. male presented 05/22/21 with generalized weakness. Adm for sepsis due to pna; large left empyema; chest tube    PMH significant of chronic pain, panic attacks, anxiety, tremors. Pt underwent VATS for decortication of lung and drainage of effusion and biospy on 05/31/21.  ? ?Clinical Impression ?  ?Pt admitted with the above diagnosis and has the deficits listed below. Pt would benefit from cont OT to increase independence with basic adls and adl transfers so he can eventually d/c home to take car of his three children.  Pt has stated he cannot return to his current home and his daughter in [place] is assisting him in finding a new home.  Pt very upset this am due to a visit from child protective services regarding the children but not talking too much about it.  Do feel, when lines are minimized, pt will be more independent with self care and may not need follow up care.  Pt currently with multiple lines and receiving blood so evaluation slightly limited. Will cont to follow acutely to focus on adls and mobility. ?  ?   ? ?Recommendations for follow up therapy are one component of a multi-disciplinary discharge planning process, led by the attending physician.  Recommendations may be updated based on patient status, additional functional criteria and insurance authorization.  ? ?Follow Up Recommendations ? Home health OT  ?  ?Assistance Recommended at Discharge Intermittent Supervision/Assistance  ?Patient can return home with the following A little help with walking and/or transfers;A little help with bathing/dressing/bathroom ? ?  ?Functional Status Assessment ? Patient has had a recent decline in their functional status and demonstrates the ability to make significant improvements in function in a reasonable and predictable amount of time.  ?Equipment  Recommendations ? BSC/3in1  ?  ?Recommendations for Other Services   ? ? ?  ?Precautions / Restrictions Precautions ?Precautions: Fall ?Restrictions ?Weight Bearing Restrictions: No ?Other Position/Activity Restrictions: mulitple lines. Pt currently receiving blood  ? ?  ? ?Mobility Bed Mobility ?Overal bed mobility: Needs Assistance ?Bed Mobility: Supine to Sit, Sit to Supine ?  ?  ?Supine to sit: Min assist ?Sit to supine: Min guard ?  ?General bed mobility comments: more of the assist was for line management but min assist needed to get feet to the floor in sitting. ?  ? ?Transfers ?Overall transfer level: Needs assistance ?Equipment used: None ?Transfers: Sit to/from Stand, Bed to chair/wheelchair/BSC ?Sit to Stand: Supervision ?Stand pivot transfers: Min assist ?  ?Step pivot transfers: Min assist ?  ?  ?General transfer comment: Pt most limited by lines.  Min assist given due to the sheer number of lines and making sure pt avoided them. ?  ? ?  ?Balance Overall balance assessment: Mild deficits observed, not formally tested ?Sitting-balance support: Feet supported, Bilateral upper extremity supported ?Sitting balance-Leahy Scale: Good ?  ?  ?Standing balance support: During functional activity, Bilateral upper extremity supported ?Standing balance-Leahy Scale: Fair ?Standing balance comment: min A for balance ?  ?  ?  ?  ?  ?  ?  ?  ?  ?  ?  ?   ? ?ADL either performed or assessed with clinical judgement  ? ?ADL Overall ADL's : Needs assistance/impaired ?  ?  ?Grooming: Wash/dry hands;Standing;Min guard ?  ?Upper Body Bathing: Set up ?  ?Lower Body Bathing: Minimal assistance;Sit  to/from stand;Cueing for compensatory techniques ?Lower Body Bathing Details (indicate cue type and reason): limited by lines ?Upper Body Dressing : Minimal assistance;Sitting ?Upper Body Dressing Details (indicate cue type and reason): assist with lines ?Lower Body Dressing: Moderate assistance;Sit to/from stand;Cueing for  compensatory techniques ?Lower Body Dressing Details (indicate cue type and reason): most limited by multiple lines ?Toilet Transfer: Minimal assistance;Stand-pivot;BSC/3in1 ?Toilet Transfer Details (indicate cue type and reason): pivot to [facility] ?Toileting- Clothing Manipulation and Hygiene: Minimal assistance;Sit to/from stand ?Toileting - Clothing Manipulation Details (indicate cue type and reason): assist for lines and balance. ?  ?  ?Functional mobility during ADLs: Minimal assistance ?General ADL Comments: Pt very weak and with slight tremor in RUE during all activities.  Pt most limited by lines at this time.  ? ? ? ?Vision Baseline Vision/History: 0 No visual deficits ?Ability to See in Adequate Light: 0 Adequate ?Patient Visual Report: No change from baseline ?Vision Assessment?: No apparent visual deficits  ?   ?Perception   ?  ?Praxis   ?  ? ?Pertinent Vitals/Pain Pain Assessment ?Pain Assessment: Faces ?Faces Pain Scale: Hurts little more ?Pain Location: left chest at CT site ?Pain Descriptors / Indicators: Discomfort ?Pain Intervention(s): Monitored during session  ? ? ? ?Hand Dominance Right ?  ?Extremity/Trunk Assessment Upper Extremity Assessment ?Upper Extremity Assessment: Overall WFL for tasks assessed ?  ?Lower Extremity Assessment ?Lower Extremity Assessment: Defer to PT evaluation ?  ?Cervical / Trunk Assessment ?Cervical / Trunk Assessment: Normal ?  ?Communication Communication ?Communication: No difficulties ?  ?Cognition Arousal/Alertness: Awake/alert ?Behavior During Therapy: Anxious ?Overall Cognitive Status: Within Functional Limits for tasks assessed ?  ?  ?  ?  ?  ?  ?  ?  ?  ?  ?  ?  ?  ?  ?  ?  ?General Comments: Pt is pleasant and oriented but very anxious.  Mild STM deficits of things happening in hospital. ?  ?  ?General Comments  Pt limited by lines and anxiety.  Pt had visit from [place] regarding children just prior to visit and was very upset not wanting ot mobilize but did end up  working for therapist and enjoyed the distraction, per report. ? ?  ?Exercises   ?  ?Shoulder Instructions    ? ? ?Home Living Family/patient expects to be discharged to:: Unsure ?Living Arrangements: Children ?Available Help at Discharge: Family;Available PRN/intermittently;Friend(s);Neighbor ?Type of Home: House ?Home Access: Stairs to enter ?Entrance Stairs-Number of Steps: 3-4 ?Entrance Stairs-Rails: Can reach both ?Home Layout: One level ?  ?  ?Bathroom Shower/Tub: Tub/shower unit ?  ?Bathroom Toilet: Standard ?Bathroom Accessibility: Yes ?  ?Home Equipment: Kasandra Knudsen - single point ?  ?Additional Comments: Pt reports he can no longer go to live with children. He doesn't know where he is going to stay upon discharge. The 3 children he lives with range from age 64-16.  Older daughter is a Marine scientist in [place]. ?  ? ?  ?Prior Functioning/Environment Prior Level of Function : Independent/Modified Independent ?  ?  ?  ?  ?  ?  ?  ?ADLs Comments: Pt reports being independent in all aspects of care and has 3 children at home he cares for ages 10,8, and 5. He is at home each day with the 63 year old. ?  ? ?  ?  ?OT Problem List: Decreased strength;Decreased activity tolerance;Impaired balance (sitting and/or standing);Decreased safety awareness;Decreased knowledge of use of DME or AE;Decreased range of motion ?  ?   ?  OT Treatment/Interventions: Self-care/ADL training;Therapeutic exercise;Energy conservation;DME and/or AE instruction;Cognitive remediation/compensation;Therapeutic activities;Balance training;Patient/family education;Modalities;Manual therapy  ?  ?OT Goals(Current goals can be found in the care plan section) Acute Rehab OT Goals ?Patient Stated Goal: to get well and get home to my kids ?OT Goal Formulation: With patient ?Time For Goal Achievement: 06/16/21 ?Potential to Achieve Goals: Fair ?ADL Goals ?Pt Will Perform Grooming: with modified independence;standing ?Pt Will Perform Lower Body Bathing: with  modified independence;sit to/from stand ?Pt Will Perform Lower Body Dressing: with modified independence;sit to/from stand ?Additional ADL Goal #1: Pt will walk to bathroom and complete all toileting with mod I.  ?OT Fre

## 2021-06-02 NOTE — Progress Notes (Signed)
Physical Therapy Treatment ?Patient Details ?Name: Lucas Burke ?MRN: [id number] ?DOB: May 01, 1960 ?Today's Date: 06/02/2021 ? ? ?History of Present Illness 61 y.o. male presented 05/22/21 with generalized weakness. Adm for sepsis due to pna; large left empyema; chest tube    PMH significant of chronic pain, panic attacks, anxiety, tremors ? ?  ?PT Comments  ? ? Pt displaying some mild balance deficits today, benefiting from using the RW for stability. Pt was able to ambulate up to ~175 ft with a RW and min guard assist for safety without LOB though. Due to pt's noted balance deficits and decreased activity tolerance, updated d/c recs to HHPT. Will continue to follow acutely. ?  ?Recommendations for follow up therapy are one component of a multi-disciplinary discharge planning process, led by the attending physician.  Recommendations may be updated based on patient status, additional functional criteria and insurance authorization. ? ?Follow Up Recommendations ? Home health PT ?  ?  ?Assistance Recommended at Discharge Intermittent Supervision/Assistance  ?Patient can return home with the following Assistance with cooking/housework;Help with stairs or ramp for entrance;A little help with bathing/dressing/bathroom;Assist for transportation ?  ?Equipment Recommendations ? Rolling walker (2 wheels)  ?  ?Recommendations for Other Services   ? ? ?  ?Precautions / Restrictions Precautions ?Precautions: Fall;Other (comment) ?Precaution Comments: chest tube; On Q-pump ?Restrictions ?Weight Bearing Restrictions: No  ?  ? ?Mobility ? Bed Mobility ?Overal bed mobility: Needs Assistance ?Bed Mobility: Supine to Sit ?  ?  ?Supine to sit: Supervision ?  ?  ?General bed mobility comments: Extra time with HOB elevated, assist for line management only, supervision for safety ?  ? ?Transfers ?Overall transfer level: Needs assistance ?Equipment used: Rolling walker (2 wheels) ?Transfers: Sit to/from Stand, Bed to  chair/wheelchair/BSC ?Sit to Stand: Supervision ?  ?Step pivot transfers: Supervision ?  ?  ?  ?General transfer comment: Supervision for safety and line management ?  ? ?Ambulation/Gait ?Ambulation/Gait assistance: Min guard ?Gait Distance (Feet): 175 Feet (x2 bouts of ~5 ft > ~175 ft) ?Assistive device: Rolling walker (2 wheels), None ?Gait Pattern/deviations: Step-through pattern, Decreased stride length, Decreased dorsiflexion - right, Decreased dorsiflexion - left, Trunk flexed ?Gait velocity: reduced ?Gait velocity interpretation: <1.8 ft/sec, indicate of risk for recurrent falls ?  ?General Gait Details: Pt with slow, fairly steady gait with RW. Cues provided to clear feet, look superiorly, and improve upright posture. No LOB, min guard for safety. ? ? ?Stairs ?  ?  ?  ?  ?  ? ? ?Wheelchair Mobility ?  ? ?Modified Rankin (Stroke Patients Only) ?  ? ? ?  ?Balance Overall balance assessment: Needs assistance ?Sitting-balance support: No upper extremity supported, Feet supported ?Sitting balance-Leahy Scale: Good ?  ?  ?Standing balance support: Bilateral upper extremity supported, During functional activity, Reliant on assistive device for balance ?Standing balance-Leahy Scale: Poor ?Standing balance comment: Reliant on RW ?  ?  ?  ?  ?  ?  ?  ?  ?  ?  ?  ?  ? ?  ?Cognition Arousal/Alertness: Awake/alert ?Behavior During Therapy: Flat affect ?Overall Cognitive Status: Within Functional Limits for tasks assessed ?  ?  ?  ?  ?  ?  ?  ?  ?  ?  ?  ?  ?  ?  ?  ?  ?General Comments: Pt just waking up from a nap and appearing to be a bit lethargic with flat affect throughout. Follows commands appropriately. ?  ?  ? ?  ?  Exercises General Exercises - Lower Extremity ?Hip Flexion/Marching: AROM, Strengthening, Both, 10 reps, Standing (with RW) ?Heel Raises: AROM, Strengthening, Both, 10 reps, Standing (with RW) ?Mini-Sqauts: AROM, Strengthening, Both, 10 reps, Standing (with RW) ? ?  ?General Comments   ?  ?   ? ?Pertinent Vitals/Pain Pain Assessment ?Pain Assessment: Faces ?Faces Pain Scale: Hurts little more ?Pain Location: generalized with mobility ?Pain Descriptors / Indicators: Discomfort, Grimacing, Guarding ?Pain Intervention(s): Limited activity within patient's tolerance, Monitored during session, Repositioned, Patient requesting pain meds-RN notified  ? ? ?Home Living   ?  ?  ?  ?  ?  ?  ?  ?  ?  ?   ?  ?Prior Function    ?  ?  ?   ? ?PT Goals (current goals can now be found in the care plan section) Acute Rehab PT Goals ?Patient Stated Goal: to improve ?PT Goal Formulation: With patient ?Time For Goal Achievement: 06/14/21 ?Potential to Achieve Goals: Good ?Progress towards PT goals: Progressing toward goals ? ?  ?Frequency ? ? ? Min 3X/week ? ? ? ?  ?PT Plan Discharge plan needs to be updated  ? ? ?Co-evaluation   ?  ?  ?  ?  ? ?  ?AM-PAC PT "6 Clicks" Mobility   ?Outcome Measure ? Help needed turning from your back to your side while in a flat bed without using bedrails?: None ?Help needed moving from lying on your back to sitting on the side of a flat bed without using bedrails?: A Little ?Help needed moving to and from a bed to a chair (including a wheelchair)?: A Little ?Help needed standing up from a chair using your arms (e.g., wheelchair or bedside chair)?: A Little ?Help needed to walk in hospital room?: A Little ?Help needed climbing 3-5 steps with a railing? : A Little ?6 Click Score: 19 ? ?  ?End of Session   ?Activity Tolerance: Patient tolerated treatment well ?Patient left: Other (comment) (in w/c ready to be transported to new room with RN) ?Nurse Communication: Mobility status;Patient requests pain meds ?PT Visit Diagnosis: Difficulty in walking, not elsewhere classified (R26.2);Unsteadiness on feet (R26.81) ?  ? ? ?Time: 1542-1600 ?PT Time Calculation (min) (ACUTE ONLY): 18 min ? ?Charges:  $Gait Training: 8-22 mins          ?          ? ?Moishe Spice, PT, DPT ?Acute Rehabilitation  Services  ?Pager: [phone] ?Office: [phone] ? ? ? ?Maretta Bees Pettis ?06/02/2021, 4:45 PM ? ?

## 2021-06-02 NOTE — Progress Notes (Signed)
Patient very claustrophobic, attempted MRI and pt was not able to get exam done.  He wanted out and to stop, said he was going to have a panic attack if he had to go in the MRI scanner.  ?

## 2021-06-02 NOTE — Progress Notes (Addendum)
? ?   ?  [address] ?      [name] [address] ?            [phone]   ? ?  ?1 Day Post-Op Procedure(s) (LRB): ?VIDEO ASSISTED THORACOSCOPY (VATS)/EMPYEMA - APPLICATION OF ON-Q PAIN PUMP (Left) ?DECORTICATION (Left) ?VIDEO BRONCHOSCOPY (N/A) ?PLEURAL BIOPSY (Left) ? ?Subjective: ? ?Patient up in bed.  He has some soreness but it is controlled with oral medication.  He did not use PCA overnight.  He denies N/V ? ?Objective: ?Vital signs in last 24 hours: ?Temp:  [97.5 ?F (36.4 ?C)-98.3 ?F (36.8 ?C)] 98.3 ?F (36.8 ?C) (03/29 0400) ?Pulse Rate:  [58-80] 73 (03/29 0600) ?Cardiac Rhythm: Normal sinus rhythm (03/29 0400) ?Resp:  [12-26] 20 (03/29 0600) ?BP: (76-120)/(56-76) 103/64 (03/29 0600) ?SpO2:  [87 %-99 %] 95 % (03/29 0600) ?Arterial Line BP: (101-130)/(37-57) 111/42 (03/29 0600) ?FiO2 (%):  [28 %] 28 % (03/28 1210) ? ?Intake/Output from previous day: ?03/28 0701 - 03/29 0700 ?In: 4058.1 [I.V.:2500.5; IV Piggyback:1557.7] ?Out: 2335 [Urine:1285; Blood:250; Chest Tube:800] ? ?General appearance: alert, cooperative, and no distress ?Heart: regular rate and rhythm ?Lungs: diminished breath sounds left base ?Abdomen: soft, non-tender; bowel sounds normal; no masses,  no organomegaly ?Extremities: extremities normal, atraumatic, no cyanosis or edema ?Wound: clean andd ry ? ?Lab Results: ?Recent Labs  ?  06/01/21 ?2952 06/02/21 ?0422 06/02/21 ?8413  ?WBC 23.9*  --  17.5*  ?HGB 9.7* 8.2* 7.8*  ?HCT 30.7* 24.0* 25.9*  ?PLT 840*  --  701*  ? ?BMET:  ?Recent Labs  ?  06/01/21 ?0128 06/02/21 ?0422 06/02/21 ?0436  ?NA 133* 136 135  ?K 4.6 4.6 4.5  ?CL 98  --  102  ?CO2 28  --  28  ?GLUCOSE 95  --  139*  ?BUN 20  --  19  ?CREATININE 0.87  --  0.92  ?CALCIUM 9.8  --  10.1  ?  ?PT/INR:  ?Recent Labs  ?  05/31/21 ?1723  ?LABPROT 14.7  ?INR 1.1  ? ?ABG ?   ?Component Value Date/Time  ? PHART 7.433 06/02/2021 0422  ? HCO3 29.0 (H) 06/02/2021 0422  ? TCO2 30 06/02/2021 0422  ? O2SAT 92 06/02/2021 0422  ? ?CBG  (last 3)  ?No results for input(s): GLUCAP in the last 72 hours. ? ?Assessment/Plan: ?S/P Procedure(s) (LRB): ?VIDEO ASSISTED THORACOSCOPY (VATS)/EMPYEMA - APPLICATION OF ON-Q PAIN PUMP (Left) ?DECORTICATION (Left) ?VIDEO BRONCHOSCOPY (N/A) ?PLEURAL BIOPSY (Left) ? ?CV- NSR, + hypotension- Neo at 15, Midodrine and IV fluids added ?Pulm- CT output 800 cc since surgery, no air leak present, CXR with improvement of left pleural effusion.Marland Kitchen leave chest tubes in place ?Renal- creatinine, lytes okay- IV fluids ordered at 50 to assist with hypotension ?Expected post operative blood loss anemia, Hgb at 7.8, 1 unit of packed cells ordered ?ID- Empyema, on broad spectrum ABX, OR cultures pending ?D/C Arterial line ?D/C Foley catheter ?Advance diet ?D/C PCA ? ?DIspo- patient stable, okay to transfer to [facility], repeat CXR in AM, care per primary ? ? LOS: 3 days  ? ? ?Ellwood Handler, PA-C ?06/02/2021 ? Wean off neo after one PRBC for preop/ postop anemia ?Leave chest tube to suction today ?Pain fairly well controlled ?patient examined and medical record reviewed,agree with above note. ?Dahlia Byes ?06/02/2021 ? ? ? ?

## 2021-06-03 ENCOUNTER — Inpatient Hospital Stay (HOSPITAL_COMMUNITY): Payer: Medicaid Other

## 2021-06-03 ENCOUNTER — Encounter: Payer: Self-pay | Admitting: *Deleted

## 2021-06-03 ENCOUNTER — Other Ambulatory Visit (HOSPITAL_COMMUNITY): Payer: Medicaid Other

## 2021-06-03 LAB — COMPREHENSIVE METABOLIC PANEL
ALT: 30 U/L (ref 0–44)
AST: 17 U/L (ref 15–41)
Albumin: 1.8 g/dL — ABNORMAL LOW (ref 3.5–5.0)
Alkaline Phosphatase: 112 U/L (ref 38–126)
Anion gap: 7 (ref 5–15)
BUN: 17 mg/dL (ref 8–23)
CO2: 29 mmol/L (ref 22–32)
Calcium: 9 mg/dL (ref 8.9–10.3)
Chloride: 101 mmol/L (ref 98–111)
Creatinine, Ser: 0.97 mg/dL (ref 0.61–1.24)
GFR, Estimated: >60 mL/min (ref >60)
Glucose, Bld: 84 mg/dL (ref 70–99)
Potassium: 3.8 mmol/L (ref 3.5–5.1)
Sodium: 137 mmol/L (ref 135–145)
Total Bilirubin: 0.6 mg/dL (ref 0.3–1.2)
Total Protein: 5.3 g/dL — ABNORMAL LOW (ref 6.5–8.1)

## 2021-06-03 LAB — CBC WITH DIFFERENTIAL/PLATELET
Abs Immature Granulocytes: 0.13 10*3/uL — ABNORMAL HIGH (ref 0.00–0.07)
Basophils Absolute: 0 10*3/uL (ref 0.0–0.1)
Basophils Relative: 0 %
Eosinophils Absolute: 0.2 10*3/uL (ref 0.0–0.5)
Eosinophils Relative: 1 %
HCT: 28.8 % — ABNORMAL LOW (ref 39.0–52.0)
Hemoglobin: 9.4 g/dL — ABNORMAL LOW (ref 13.0–17.0)
Immature Granulocytes: 1 %
Lymphocytes Relative: 10 %
Lymphs Abs: 1.6 10*3/uL (ref 0.7–4.0)
MCH: 27.2 pg (ref 26.0–34.0)
MCHC: 32.6 g/dL (ref 30.0–36.0)
MCV: 83.5 fL (ref 80.0–100.0)
Monocytes Absolute: 1.6 10*3/uL — ABNORMAL HIGH (ref 0.1–1.0)
Monocytes Relative: 10 %
Neutro Abs: 12.4 10*3/uL — ABNORMAL HIGH (ref 1.7–7.7)
Neutrophils Relative %: 78 %
Platelets: 691 10*3/uL — ABNORMAL HIGH (ref 150–400)
RBC: 3.45 MIL/uL — ABNORMAL LOW (ref 4.22–5.81)
RDW: 13.6 % (ref 11.5–15.5)
WBC: 15.9 10*3/uL — ABNORMAL HIGH (ref 4.0–10.5)
nRBC: 0 % (ref 0.0–0.2)

## 2021-06-03 LAB — PROCALCITONIN: Procalcitonin: 0.23 ng/mL

## 2021-06-03 LAB — CULTURE, RESPIRATORY W GRAM STAIN: Culture: NO GROWTH

## 2021-06-03 LAB — MAGNESIUM: Magnesium: 1.4 mg/dL — ABNORMAL LOW (ref 1.7–2.4)

## 2021-06-03 LAB — BRAIN NATRIURETIC PEPTIDE: B Natriuretic Peptide: 450 pg/mL — ABNORMAL HIGH (ref 0.0–100.0)

## 2021-06-03 LAB — PREALBUMIN: Prealbumin: 10.9 mg/dL — ABNORMAL LOW (ref 18–38)

## 2021-06-03 LAB — PTH-RELATED PEPTIDE: PTH-related peptide: <2 pmol/L

## 2021-06-03 LAB — GLUCOSE, CAPILLARY: Glucose-Capillary: 87 mg/dL (ref 70–99)

## 2021-06-03 LAB — C-REACTIVE PROTEIN: CRP: 10 mg/dL — ABNORMAL HIGH (ref <1.0)

## 2021-06-03 MED ORDER — SODIUM CHLORIDE 0.9 % IV SOLN: INTRAVENOUS | Status: AC

## 2021-06-03 MED ORDER — LORAZEPAM 2 MG/ML IJ SOLN
2.0000 mg | Freq: Once | INTRAMUSCULAR | Status: AC
  Administered 2021-06-03: 2 mg via INTRAVENOUS
  Filled 2021-06-03: qty 1

## 2021-06-03 MED ORDER — TECHNETIUM TC 99M MEDRONATE IV KIT
20.0000 | PACK | Freq: Once | INTRAVENOUS | Status: AC | PRN
  Administered 2021-06-03: 20 via INTRAVENOUS

## 2021-06-03 MED ORDER — DIAZEPAM 5 MG PO TABS
5.0000 mg | ORAL_TABLET | Freq: Two times a day (BID) | ORAL | Status: AC
  Administered 2021-06-03 (×2): 5 mg via ORAL
  Filled 2021-06-03 (×2): qty 1

## 2021-06-03 MED ORDER — HALOPERIDOL LACTATE 5 MG/ML IJ SOLN
5.0000 mg | Freq: Once | INTRAMUSCULAR | Status: AC | PRN
  Administered 2021-06-03: 5 mg via INTRAVENOUS
  Filled 2021-06-03: qty 1

## 2021-06-03 MED ORDER — MAGNESIUM SULFATE 4 GM/100ML IV SOLN
4.0000 g | Freq: Once | INTRAVENOUS | Status: AC
  Administered 2021-06-03: 4 g via INTRAVENOUS
  Filled 2021-06-03: qty 100

## 2021-06-03 NOTE — Progress Notes (Addendum)
? ?   ?  [address] ?      [name] [address] ?            [phone]   ? ?  ?2 Days Post-Op Procedure(s) (LRB): ?VIDEO ASSISTED THORACOSCOPY (VATS)/EMPYEMA - APPLICATION OF ON-Q PAIN PUMP (Left) ?DECORTICATION (Left) ?VIDEO BRONCHOSCOPY (N/A) ?PLEURAL BIOPSY (Left) ? ?Subjective: ? ?Patient doing as well today.  He states he has been up all night urinating, states his bed is soaked.  He also states his mouth is so dry.  MRI attempted last night, but was unable to be completed due to claustrophobia ? ?Objective: ?Vital signs in last 24 hours: ?Temp:  [97.7 ?F (36.5 ?C)-98.5 ?F (36.9 ?C)] 98.5 ?F (36.9 ?C) (03/30 0300) ?Pulse Rate:  [65-272] 70 (03/29 1648) ?Cardiac Rhythm: Normal sinus rhythm (03/30 0704) ?Resp:  [13-26] 18 (03/30 0300) ?BP: (103-128)/(61-89) 115/74 (03/30 0300) ?SpO2:  [90 %-97 %] 92 % (03/30 0300) ?Arterial Line BP: (108-145)/(46-65) 108/65 (03/29 0950) ? ?Intake/Output from previous day: ?03/29 0701 - 03/30 0700 ?In: 1976.5 [P.O.:120; I.V.:1241.6; Blood:315; IV Piggyback:299.9] ?Out: 0786 [Urine:5250; Chest Tube:130] ? ?General appearance: alert, cooperative, tremor present, malnourished ?Heart: regular rate and rhythm ?Lungs: diminished breath sounds on left ?Abdomen: soft, non-tender; bowel sounds normal; no masses,  no organomegaly ?Extremities: extremities normal, atraumatic, no cyanosis or edema ?Wound: clean and dry ? ?Lab Results: ?Recent Labs  ?  06/02/21 ?0436 06/03/21 ?0325  ?WBC 17.5* 15.9*  ?HGB 7.8* 9.4*  ?HCT 25.9* 28.8*  ?PLT 701* 691*  ? ?BMET:  ?Recent Labs  ?  06/02/21 ?0436 06/03/21 ?0325  ?NA 135 137  ?K 4.5 3.8  ?CL 102 101  ?CO2 28 29  ?GLUCOSE 139* 84  ?BUN 19 17  ?CREATININE 0.92 0.97  ?CALCIUM 10.1 9.0  ?  ?PT/INR:  ?Recent Labs  ?  05/31/21 ?1723  ?LABPROT 14.7  ?INR 1.1  ? ?ABG ?   ?Component Value Date/Time  ? PHART 7.433 06/02/2021 0422  ? HCO3 29.0 (H) 06/02/2021 0422  ? TCO2 30 06/02/2021 0422  ? O2SAT 92 06/02/2021 0422  ? ?CBG (last 3)  ?No  results for input(s): GLUCAP in the last 72 hours. ? ?Assessment/Plan: ?S/P Procedure(s) (LRB): ?VIDEO ASSISTED THORACOSCOPY (VATS)/EMPYEMA - APPLICATION OF ON-Q PAIN PUMP (Left) ?DECORTICATION (Left) ?VIDEO BRONCHOSCOPY (N/A) ?PLEURAL BIOPSY (Left) ? 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  This is being utilized due to hypercalcemia. Feels dry to all diuretics, despite IV hydration, magnesium being replaced, CT w/o air leak.. patient encouraged to use IS more regularly with deep breathing and cough, CT to remain in place today, care per primary ? ? LOS: 4 days  ? ?Ellwood Handler, PA-C ?06/03/2021 ? Pain adequately controlled- remove On-Q tomorrow ?Leave chest tube to suction today ?CXR with atelectasis of LLL ?Appreciate Oncology input to help this patient ? ?patient examined and medical record reviewed,agree with above note. ?Dahlia Byes ?06/03/2021 ? ? ?

## 2021-06-03 NOTE — Progress Notes (Signed)
?                                  PROGRESS NOTE                                             ?                                                                                                                     ?                                         ? ? Patient Demographics:  ? ? Lucas Burke, is a 61 y.o. male, DOB - 07/11/1960, BMW:[id number] ? ?Outpatient Primary [facility] the [facility] is [facility] [place]    LOS - 4  Admit date - 05/30/2021   ? ?No chief complaint on file. ?    ? ?Brief Narrative (HPI from H&P) history 61 year old Caucasian male with history of chronic pain, panic attacks, anxiety, tremors who presented to [facility] hospital on 05/22/2021 with generalized weakness, his work-up was suggestive of left-sided empyema requiring chest tube placement, he was seen by pulmonary critical care and IR at [facility] and then transferred to [facility] on 05/30/2021 for further evaluation by cardiothoracic surgery and pulmonary critical team here.  Of note his CT scan done at [facility] is highly suspicious for left bronchus obstruction and underlying malignancy.  He is extremely weak and cachectic. ? ? Subjective:  ? ?Patient in bed appears to be in no distress, still has some pleuritic left-sided chest tube site pain, minimal shortness of breath, no headache, no abdominal discomfort or focal weakness. ? ? Assessment  & Plan :  ? ? ?Pneumonia, severe sepsis with left-sided empyema - he was seen by pulmonary critical care and IR team at [facility] underwent left-sided chest tube placement on 05/28/2021 with minimal improvement, now transferred to [facility] on 05/30/2021, currently on Unasyn, and by pulmonary and cardiothoracic surgery, s/p VATS procedure on 06/01/2021.  Chest tube in place, also question of non-small cell cancer on biopsies obtained during the VATS procedure and bronchoscopy.  Follow cultures. ? ?2.  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  Oncology on board, whole-body scan and MRI brain pending, patient had claustrophobia medications adjusted further on 06/03/2021 to help him with MRI related claustrophobia. ? ?3. Severe protein calorie malnutrition and cachexia.  Placed on protein supplements. ?  ?4.  Severe hypercalcemia with appropriately suppressed PTH.  Stable ACE levels, stable vitamin D 25 and  calcitriol levels, has been aggressively hydrated and given 3 doses of IV Salmon calcitonin calcium levels are better but still high will monitor will eventually most likely require Zometa.  Pending PTH RP levels. ? ?5.  Weakness and deconditioning.  PT OT. ? ?6.  Iron deficiency with perioperative acute blood loss.  On iron supplements orally, 1 unit of packed RBC given 06/02/2021 in the OR. ? ?7.  Chronic neck back pain.  Supportive care. ? ?8.  Suppressed PTH in response to hypercalcemia likely due to underlying malignancy.  Treat hypercalcemia. ? ?   ? ?Condition - Extremely Guarded ? ?Family Communication  : daughter [name] [phone] - 06/03/21 ? ?Code Status : Full ? ?Consults  : Pulmonary critical care, cardiothoracic surgery ? ?PUD Prophylaxis : PPI ? ? Procedures  :    ? ?VATS procedure 06/01/2021 by cardiothoracic surgery  ? ?CT chest done at [facility] 05/27/2021.  Large loculated left-sided pleural effusion, possible obstruction of the left bronchus with mediastinal adenopathy on the left side.   ? ?05/28/2021.  CT-guided left chest tube placement by IR at [facility]. ? ?Thyroid ultrasound.  Unremarkable. ? ?   ? ?Disposition Plan  :   ? ?Status is: Inpatient ? ?DVT Prophylaxis  :   ? ?enoxaparin (LOVENOX) injection 40 mg Start: 06/03/21 1000 ?SCD's Start: 06/01/21 1557 ?  ? ?Lab Results  ?Component Value Date  ? PLT 691 (H) 06/03/2021  ? ? ?Diet :  ?Diet Order    ? ?       ?  Diet regular Room service appropriate? Yes; Fluid consistency: Thin  Diet effective now       ?  ? ?  ?  ? ?  ?  ? ?Inpatient Medications ? ?Scheduled Meds: ? (feeding supplement) PROSource Plus  30 mL Oral BID BM  ? acetaminophen  1,000 mg Oral Q6H  ? Or  ? acetaminophen (TYLENOL) oral liquid 160 mg/5 mL  1,000 mg Oral Q6H  ? bisacodyl  10 mg Oral Daily  ? Chlorhexidine Gluconate Cloth  6 each Topical Daily  ? diazepam  5 mg Oral BID  ? enoxaparin (LOVENOX) injection  40 mg Subcutaneous Daily  ? feeding supplement  237 mL Oral TID BM  ? furosemide  40 mg Intravenous Q8H  ? LORazepam  2 mg Intravenous Once  ? midodrine  10 mg Oral TID WC  ? multivitamin with minerals  1 tablet Oral Daily  ? pantoprazole  40 mg Oral Daily  ? PARoxetine  20 mg Oral Daily  ? senna-docusate  1 tablet Oral QHS  ? sodium chloride flush  3 mL Intravenous Q12H  ? ?Continuous Infusions: ? sodium chloride    ? ampicillin-sulbactam (UNASYN) 3 g IVPB (Mini-Bag Plus) 3 g (06/03/21 0351)  ? bupivacaine 0.5 % ON-Q pump SINGLE CATH 400 mL    ? ?PRN Meds:.acetaminophen **OR** acetaminophen, ALPRAZolam, alum & mag hydroxide-simeth, fentaNYL, haloperidol lactate, morphine injection, ondansetron (ZOFRAN) IV, ondansetron, oxyCODONE, polyethylene glycol, traMADol ? ?Antibiotics  :   ? ?Anti-infectives (From admission, onward)  ? ? Start     Dose/Rate Route Frequency Ordered Stop  ? 05/31/21 1651  ceFAZolin (ANCEF) IVPB 2g/100 mL premix       ? 2 g ?200 mL/hr over 30 Minutes Intravenous 30 min pre-op 05/31/21 1651 06/01/21 0842  ? 05/30/21 2200  Ampicillin-Sulbactam (UNASYN) 3 g in sodium chloride 0.9 % 100 mL IVPB       ? 3 g ?200 mL/hr over 30  Minutes Intravenous Every 6 hours 05/30/21 2044    ? ?  ? ? ? Time Spent in minutes  30 ? ? ?Lala Lund M.D on 06/03/2021 at 9:43 AM ? ?To page go to [email or web address]  ? ?[facility] -  Office  [phone] ? ?See all Orders from today for further details ? ? ? Objective:  ? ?Vitals:  ?  06/02/21 1916 06/02/21 1925 06/02/21 2300 06/03/21 0300  ?BP: 105/61  128/76 115/74  ?Pulse:      ?Resp: 16 16 20 18   ?Temp: 98.5 ?F (36.9 ?C)  98.3 ?F (36.8 ?C) 98.5 ?F (36.9 ?C)  ?TempSrc: Oral  Oral Oral  ?SpO2: 92%  94% 92%  ?Weight:      ?Height:      ? ? ?Wt Readings from Last 3 Encounters:  ?05/31/21 76 kg  ?05/22/21 71.4 kg  ?05/22/19 79.4 kg  ? ? ? ?Intake/Output Summary (Last 24 hours) at 06/03/2021 0943 ?Last data filed at 06/03/2021 0620 ?Gross per 24 hour  ?Intake 1656.46 ml  ?Output 5205 ml  ?Net -3548.54 ml  ? ? ? ?Physical Exam ? ?Middle-aged Caucasian male who appears ill and cachectic lying in hospital bed in no apparent distress, no focal deficits,  reduced left-sided breath sounds with left-sided chest tube in place. ?[place].AT,PERRAL ?Supple Neck, No JVD,   ?Symmetrical Chest wall movement, Good air movement bilaterally, CTAB ?RRR,No Gallops, Rubs or new Murmurs,  ?+ve B.Sounds, Abd Soft, No tenderness,   ?No Cyanosis, Clubbing or edema  ? ? ? ? Data Review:  ? ? ?CBC ?Recent Labs  ?Lab 05/31/21 ?1287 06/01/21 ?0128 06/01/21 ?1634 06/02/21 ?0422 06/02/21 ?8676 06/03/21 ?7209  ?WBC 15.4* 15.7* 23.9*  --  17.5* 15.9*  ?HGB 9.9* 9.2* 9.7* 8.2* 7.8* 9.4*  ?HCT 30.7* 29.8* 30.7* 24.0* 25.9* 28.8*  ?PLT 714* 788* 840*  --  701* 691*  ?MCV 82.7 83.2 83.0  --  84.6 83.5  ?MCH 26.7 25.7* 26.2  --  25.5* 27.2  ?MCHC 32.2 30.9 31.6  --  30.1 32.6  ?RDW 13.2 13.2 13.2  --  13.3 13.6  ?LYMPHSABS  --  1.7  --   --  1.3 1.6  ?MONOABS  --  1.5*  --   --  1.7* 1.6*  ?EOSABS  --  0.3  --   --  0.0 0.2  ?BASOSABS  --  0.0  --   --  0.0 0.0  ? ? ?Electrolytes ?Recent Labs  ?Lab 05/28/21 ?0400 05/28/21 ?0423 05/29/21 ?4709 05/30/21 ?6283 05/31/21 ?6629 05/31/21 ?4765 05/31/21 ?1723 06/01/21 ?0128 06/02/21 ?0422 06/02/21 ?4650 06/03/21 ?3546 06/03/21 ?5681  ?NA  --    < >  --  135 133*  --   --  133* 136 135 137  --   ?K  --    < >  --  4.6 4.4  --   --  4.6 4.6 4.5 3.8  --   ?CL  --    < >  --  99 99  --   --  98  --  102  101  --   ?CO2  --    < >  --  31 28  --   --  28  --  28 29  --   ?GLUCOSE  --    < >  --  101* 101*  --   --  95  --  139* 84  --   ?BUN  --    < >  --  20 19  --   --  20  --  19 17  --   ?CREATININE  -

## 2021-06-03 NOTE — Progress Notes (Signed)
[employer] testing request sent on specimen from 06/01/2021. ? ?Oncology Nurse Navigator Documentation ? ? ?  06/03/2021  ? 12:15 PM  ?Oncology Nurse Navigator Flowsheets  ?Abnormal Finding Date 05/22/2021  ?Confirmed Diagnosis Date 06/01/2021  ?Diagnosis Status Pending Molecular Studies  ?Navigator Location [facility]  ?Navigator Encounter Type Molecular Studies  ?Patient Visit Type MedOnc  ?Treatment Phase Pre-Tx/Tx Discussion  ?Barriers/Navigation Needs Coordination of Care  ?Interventions Coordination of Care  ?Acuity Level 2-Minimal Needs (1-2 Barriers Identified)  ?Coordination of Care Pathology  ?Time Spent with Patient 30  ?  ?

## 2021-06-03 NOTE — Progress Notes (Signed)
Mobility Specialist Progress Note ? ?Pt unsuitable for mobility specialist at this time given level of complexity, physical assist, and/or precautions as advised by RN. Mobility specialist to hold today, will continue to follow for readiness. ? ?Holland Falling ?Mobility Specialist ?Phone Number [phone] ? ?

## 2021-06-03 NOTE — Progress Notes (Signed)
Attempted MRI of this pt. Upon setting up to begin exam pt started grabbing head coil and refusing the exam. Head coil taken off and pt moved back to bed. RN aware.  ?

## 2021-06-03 NOTE — Progress Notes (Signed)
Interventional Radiology Brief Note: ? ?Patient with recently found stage IV non-small cell lung cancer complicated by empyema requiring VATS.  Patient will ultimately likely need a Port-A-Cath, however currently has left chest tube and left IJ central venous catheter.  ? ?Discussed with Dr. Dwaine Gale who recommends continued medical management of acute illness with consideration for outpatient Port-A-Cath placement once stable and recovered.  ? ?Care team-- please place outpatient order for [facility] placement at discharge.  ? ?Brynda Greathouse, MS RD PA-C ?9:33 AM ? ? ?

## 2021-06-03 NOTE — Progress Notes (Signed)
Nutrition Follow-up ? ?DOCUMENTATION CODES:  ? ?Severe malnutrition in context of chronic illness ? ?INTERVENTION:  ? ?- Increase Ensure Enlive po to TID, each supplement provides 350 kcal and 20 grams of protein ? ?- Continue ProSource Plus po 30 ml BID, each supplement provides 100 kcal and 15 grams of protein ? ?- Continue MVI with minerals daily ? ?- Add Mighty Shakes TID with meals, each supplement provides 330 kcal and 9 grams of protein ? ?- Continue liberalized Regular diet order ? ?- Encourage PO intake ? ?NUTRITION DIAGNOSIS:  ? 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 ? ?New diagnosis after completion of NFPE ? ?GOAL:  ? ?Patient will meet greater than or equal to 90% of their needs ? ?Progressing ? ?MONITOR:  ? ?PO intake, Supplement acceptance, Labs, Weight trends ? ?REASON FOR ASSESSMENT:  ? ?Consult ?Assessment of nutrition requirement/status ? ?ASSESSMENT:  ? ?61 y.o. male presented to the ED with generalized weakness. Pt recently admitted to [facility] for sepsis 2/2 pneumonia. PMH includes malnutrition. Pt admitted with sepsis and empyema of L pleural space. ? ?03/18 - admitted to [facility] ?03/24 - chest tube placed by IR ?03/26 - transferred to [facility] ?03/28 - s/p L VATS, biopsy showing non-small cell lung cancer ? 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 ? ?Attempted to speak with pt at bedside. Pt sleeping soundly on RD visit but awoke briefly. Pt with untouched lunch tray in room. Pt states that he is hungry but would prefer to go back to sleep right now due to feeling extremely tired. Noted 100% completed Ensure supplement on tray table. Pt states that he enjoys these. Will increase order from BID to TID. RD encouraged pt to eat more at meals and pt states that he will after he wakes up. ? ?Meal Completion: 0-50% ? ?Medications  reviewed and include: ProSource Plus 30 ml BID, dulcolax, Ensure Enlive TID, IV lasix, MVI with minerals, protonix, senna, IV abx ?IVF: NS @ 75 ml/hr ? ?Labs reviewed: magnesium 1.4 (received IV magnesium sulfate 4 grams x 1), WBC 15.9, hemoglobin 9.4 ? ?UOP: 5250 ml x 24 hours ?CT: 130 ml x 24 hours ?I/O's: -5.4 L since admit ? ?NUTRITION - FOCUSED PHYSICAL EXAM: ? ?Flowsheet Row Most Recent Value  ?Orbital Region Severe depletion  ?Upper Arm Region Severe depletion  ?Thoracic and Lumbar Region Moderate depletion  ?Buccal Region Moderate depletion  ?Temple Region Severe depletion  ?Clavicle Bone Region Severe depletion  ?Clavicle and Acromion Bone Region Severe depletion  ?Scapular Bone Region Moderate depletion  ?Dorsal Hand Moderate depletion  ?Patellar Region Severe depletion  ?Anterior Thigh Region Severe depletion  ?Posterior Calf Region Severe depletion  ?Edema (RD Assessment) None  ?Hair Reviewed  ?Eyes Reviewed  ?Mouth Reviewed  ?Nails Reviewed  ? ?  ? ? ?Diet Order:   ?Diet Order   ? ?       ?  Diet regular Room service appropriate? Yes; Fluid consistency: Thin  Diet effective now       ?  ? ?  ?  ? ?  ? ? ?EDUCATION NEEDS:  ? ?Education needs have been addressed ? ?Skin:  Skin Assessment: Reviewed RN Assessment (incision L chest) ? ?Last BM:  05/29/21 ? ?Height:  ? ?Ht Readings from Last 1 Encounters:  ?05/31/21 6' (1.829 m)  ? ? ?Weight:  ? ?  Wt Readings from Last 1 Encounters:  ?05/31/21 76 kg  ? ? ?Ideal Body Weight:  80.9 kg ? ?BMI:  Body mass index is 22.72 kg/m?. ? ?Estimated Nutritional Needs:  ? ?Kcal:  2100-2300 ? ?Protein:  105-120 grams ? ?Fluid:  >/= 2.1 L ? ? ? ?Gustavus Bryant, MS, RD, LDN ?Inpatient Clinical Dietitian ?Please see AMiON for contact information. ? ?

## 2021-06-04 ENCOUNTER — Inpatient Hospital Stay (HOSPITAL_COMMUNITY): Payer: Medicaid Other

## 2021-06-04 LAB — TYPE AND SCREEN
ABO/RH(D): B POS
Antibody Screen: NEGATIVE
Unit division: 0
Unit division: 0

## 2021-06-04 LAB — CBC WITH DIFFERENTIAL/PLATELET
Abs Immature Granulocytes: 0.13 10*3/uL — ABNORMAL HIGH (ref 0.00–0.07)
Basophils Absolute: 0 10*3/uL (ref 0.0–0.1)
Basophils Relative: 0 %
Eosinophils Absolute: 0.2 10*3/uL (ref 0.0–0.5)
Eosinophils Relative: 1 %
HCT: 32.3 % — ABNORMAL LOW (ref 39.0–52.0)
Hemoglobin: 10.5 g/dL — ABNORMAL LOW (ref 13.0–17.0)
Immature Granulocytes: 1 %
Lymphocytes Relative: 14 %
Lymphs Abs: 2 10*3/uL (ref 0.7–4.0)
MCH: 26.7 pg (ref 26.0–34.0)
MCHC: 32.5 g/dL (ref 30.0–36.0)
MCV: 82.2 fL (ref 80.0–100.0)
Monocytes Absolute: 1.6 10*3/uL — ABNORMAL HIGH (ref 0.1–1.0)
Monocytes Relative: 11 %
Neutro Abs: 10.8 10*3/uL — ABNORMAL HIGH (ref 1.7–7.7)
Neutrophils Relative %: 73 %
Platelets: 675 10*3/uL — ABNORMAL HIGH (ref 150–400)
RBC: 3.93 MIL/uL — ABNORMAL LOW (ref 4.22–5.81)
RDW: 13.9 % (ref 11.5–15.5)
WBC: 14.8 10*3/uL — ABNORMAL HIGH (ref 4.0–10.5)
nRBC: 0 % (ref 0.0–0.2)

## 2021-06-04 LAB — BPAM RBC
Blood Product Expiration Date: 202304202359
Blood Product Expiration Date: 202304202359
ISSUE DATE / TIME: 202303290810
Unit Type and Rh: 7300
Unit Type and Rh: 7300

## 2021-06-04 LAB — COMPREHENSIVE METABOLIC PANEL
ALT: 27 U/L (ref 0–44)
AST: 17 U/L (ref 15–41)
Albumin: 1.9 g/dL — ABNORMAL LOW (ref 3.5–5.0)
Alkaline Phosphatase: 128 U/L — ABNORMAL HIGH (ref 38–126)
Anion gap: 9 (ref 5–15)
BUN: 16 mg/dL (ref 8–23)
CO2: 34 mmol/L — ABNORMAL HIGH (ref 22–32)
Calcium: 10.4 mg/dL — ABNORMAL HIGH (ref 8.9–10.3)
Chloride: 94 mmol/L — ABNORMAL LOW (ref 98–111)
Creatinine, Ser: 0.91 mg/dL (ref 0.61–1.24)
GFR, Estimated: >60 mL/min (ref >60)
Glucose, Bld: 93 mg/dL (ref 70–99)
Potassium: 3.1 mmol/L — ABNORMAL LOW (ref 3.5–5.1)
Sodium: 137 mmol/L (ref 135–145)
Total Bilirubin: 0.4 mg/dL (ref 0.3–1.2)
Total Protein: 5.9 g/dL — ABNORMAL LOW (ref 6.5–8.1)

## 2021-06-04 LAB — MAGNESIUM: Magnesium: 1.5 mg/dL — ABNORMAL LOW (ref 1.7–2.4)

## 2021-06-04 LAB — PROCALCITONIN: Procalcitonin: 0.27 ng/mL

## 2021-06-04 LAB — C-REACTIVE PROTEIN: CRP: 17.9 mg/dL — ABNORMAL HIGH (ref <1.0)

## 2021-06-04 LAB — BRAIN NATRIURETIC PEPTIDE: B Natriuretic Peptide: 104.1 pg/mL — ABNORMAL HIGH (ref 0.0–100.0)

## 2021-06-04 MED ORDER — ZOLEDRONIC ACID 4 MG/5ML IV CONC
4.0000 mg | Freq: Once | INTRAVENOUS | Status: AC
  Administered 2021-06-04: 4 mg via INTRAVENOUS
  Filled 2021-06-04: qty 5

## 2021-06-04 MED ORDER — ZOLEDRONIC ACID 4 MG/100ML IV SOLN
4.0000 mg | Freq: Once | INTRAVENOUS | Status: DC
  Filled 2021-06-04 (×3): qty 100

## 2021-06-04 MED ORDER — LUNG SURGERY BOOK
Freq: Once | Status: AC
  Filled 2021-06-04: qty 1

## 2021-06-04 MED ORDER — MAGNESIUM SULFATE 4 GM/100ML IV SOLN
4.0000 g | Freq: Once | INTRAVENOUS | Status: AC
  Administered 2021-06-04: 4 g via INTRAVENOUS
  Filled 2021-06-04: qty 100

## 2021-06-04 MED ORDER — GABAPENTIN 300 MG PO CAPS
300.0000 mg | ORAL_CAPSULE | Freq: Two times a day (BID) | ORAL | Status: DC
  Administered 2021-06-04 – 2021-06-08 (×9): 300 mg via ORAL
  Filled 2021-06-04 (×9): qty 1

## 2021-06-04 MED ORDER — POTASSIUM CHLORIDE CRYS ER 20 MEQ PO TBCR
20.0000 meq | EXTENDED_RELEASE_TABLET | ORAL | Status: AC
  Administered 2021-06-04 (×3): 20 meq via ORAL
  Filled 2021-06-04 (×3): qty 1

## 2021-06-04 NOTE — Progress Notes (Signed)
Occupational Therapy Treatment ?Patient Details ?Name: Lucas Burke ?MRN: [id number] ?DOB: 1960-08-18 ?Today's Date: 06/04/2021 ? ? ?History of present illness 61 y.o. male presented 05/22/21 with generalized weakness. Adm for sepsis due to pna; large left empyema; chest tube    PMH significant of chronic pain, panic attacks, anxiety, tremors ?  ?OT comments ? Pt making good progress toward all goals. Pt appeared less anxious today and was at min assist to supervision level with all adls.  Will continue to focus on getting pt up on his feet. ?  ? ?Recommendations for follow up therapy are one component of a multi-disciplinary discharge planning process, led by the attending physician.  Recommendations may be updated based on patient status, additional functional criteria and insurance authorization. ?   ?Follow Up Recommendations ? Home health OT  ?  ?Assistance Recommended at Discharge Intermittent Supervision/Assistance  ?Patient can return home with the following ? A little help with walking and/or transfers;A little help with bathing/dressing/bathroom ?  ?Equipment Recommendations ? BSC/3in1  ?  ?Recommendations for Other Services   ? ?  ?Precautions / Restrictions Precautions ?Precautions: Fall ?Precaution Comments: chest tube; On Q-pump ?Restrictions ?Weight Bearing Restrictions: No  ? ? ?  ? ?Mobility Bed Mobility ?  ?  ?  ?  ?  ?  ?  ?General bed mobility comments: Pt up on arrival. ?  ? ?Transfers ?Overall transfer level: Needs assistance ?Equipment used: Rolling walker (2 wheels) ?Transfers: Sit to/from Stand, Bed to chair/wheelchair/BSC ?Sit to Stand: Supervision ?Stand pivot transfers: Supervision ?  ?Step pivot transfers: Supervision ?  ?  ?General transfer comment: Supervision for safety and line management ?  ?  ?Balance Overall balance assessment: Needs assistance ?Sitting-balance support: No upper extremity supported, Feet supported ?Sitting balance-Leahy Scale: Good ?  ?  ?Standing balance support:  Bilateral upper extremity supported, During functional activity, Reliant on assistive device for balance ?Standing balance-Leahy Scale: Fair ?Standing balance comment: Pt was reliant on walker but able to let go of walker for short amounts of time during adls. ?  ?  ?  ?  ?  ?  ?  ?  ?  ?  ?  ?   ? ?ADL either performed or assessed with clinical judgement  ? ?ADL Overall ADL's : Needs assistance/impaired ?  ?  ?Grooming: Wash/dry hands;Wash/dry face;Oral care;Supervision/safety;Standing ?Grooming Details (indicate cue type and reason): Pt stood at sink for appx 2 minutes ?  ?  ?  ?  ?  ?  ?Lower Body Dressing: Minimal assistance;Sit to/from stand;Cueing for compensatory techniques ?Lower Body Dressing Details (indicate cue type and reason): pt stood with no overt LOB ?Toilet Transfer: Min guard;Stand-pivot;BSC/3in1 ?Toilet Transfer Details (indicate cue type and reason): pivot to [facility] ?Toileting- Clothing Manipulation and Hygiene: Total assistance;Sit to/from stand ?Toileting - Clothing Manipulation Details (indicate cue type and reason): Pts hands covered in stool on arrival. Pt deceided to disimpact himself with success.  pt total assist to clean up due to amount  to clean. ?  ?  ?Functional mobility during ADLs: Min guard;Rolling walker (2 wheels) ?General ADL Comments: Pt more independent with adls this session.  Tremor in RUE not noted. Pt ambulated in room with assist for lines. ?  ? ?Extremity/Trunk Assessment Upper Extremity Assessment ?Upper Extremity Assessment: Overall WFL for tasks assessed ?  ?Lower Extremity Assessment ?Lower Extremity Assessment: Defer to PT evaluation ?  ?  ?  ? ?Vision   ?Vision Assessment?: No apparent visual deficits ?  ?  Perception Perception ?Perception: Within Functional Limits ?  ?Praxis Praxis ?Praxis: Intact ?  ? ?Cognition Arousal/Alertness: Awake/alert ?Behavior During Therapy: Flat affect ?Overall Cognitive Status: Within Functional Limits for tasks assessed ?  ?  ?  ?  ?   ?  ?  ?  ?  ?  ?  ?  ?  ?  ?  ?  ?General Comments: pt appears appropriate.  Came into room with stool on pts hands. Pt stated he was constipated and he disimpacted himself. ?  ?  ?   ?Exercises   ? ?  ?Shoulder Instructions   ? ? ?  ?General Comments Pt making slow and steady improvements with all adls. Session most limited by lines  ? ? ?Pertinent Vitals/ Pain       Pain Assessment ?Pain Assessment: Faces ?Faces Pain Scale: Hurts little more ?Pain Location: chest tube site ?Pain Descriptors / Indicators: Grimacing, Aching ?Pain Intervention(s): Monitored during session, Repositioned ? ?Home Living   ?  ?  ?  ?  ?  ?  ?  ?  ?  ?  ?  ?  ?  ?  ?  ?  ?  ?  ? ?  ?Prior Functioning/Environment    ?  ?  ?  ?   ? ?Frequency ? Min 2X/week  ? ? ? ? ?  ?Progress Toward Goals ? ?OT Goals(current goals can now be found in the care plan section) ? Progress towards OT goals: Progressing toward goals ? ?Acute Rehab OT Goals ?Patient Stated Goal: to get back to my kids ?OT Goal Formulation: With patient ?Time For Goal Achievement: 06/16/21 ?Potential to Achieve Goals: Fair ?ADL Goals ?Pt Will Perform Grooming: with modified independence;standing ?Pt Will Perform Lower Body Bathing: with modified independence;sit to/from stand ?Pt Will Perform Lower Body Dressing: with modified independence;sit to/from stand ?Pt Will Transfer to Toilet: with modified independence;ambulating ?Pt Will Perform Toileting - Clothing Manipulation and hygiene: with modified independence;sit to/from stand ?Additional ADL Goal #1: Pt will walk to bathroom and complete all toileting with mod I.  ?Plan Discharge plan remains appropriate;Frequency remains appropriate   ? ?Co-evaluation ? ? ?   ?  ?  ?  ?  ? ?  ?AM-PAC OT "6 Clicks" Daily Activity     ?Outcome Measure ? ? Help from another person eating meals?: None ?Help from another person taking care of personal grooming?: None ?Help from another person toileting, which includes using toliet, bedpan, or  urinal?: A Little ?Help from another person bathing (including washing, rinsing, drying)?: A Little ?Help from another person to put on and taking off regular upper body clothing?: A Little ?Help from another person to put on and taking off regular lower body clothing?: A Little ?6 Click Score: 20 ? ?  ?End of Session Equipment Utilized During Treatment: Rolling walker (2 wheels) ? ?OT Visit Diagnosis: Unsteadiness on feet (R26.81);Muscle weakness (generalized) (M62.81) ?  ?Activity Tolerance Patient tolerated treatment well ?  ?Patient Left in chair;with call bell/phone within reach ?  ?Nurse Communication Mobility status ?  ? ?   ? ?Time: 1001-1031 ?OT Time Calculation (min): 30 min ? ?Charges: OT General Charges ?$OT Visit: 1 Visit ?OT Treatments ?$Self Care/Home Management : 23-37 mins ? ? ? ?Glenford Peers ?06/04/2021, 10:42 AM ?

## 2021-06-04 NOTE — Progress Notes (Signed)
Unfortunately, he cannot do the MRI because of claustrophobia.  We will have to do a CT scan of the brain. ? ?Interventional Radiology feels that a Port-A-Cath can be done as an outpatient.  This will have to be arranged. ? ?He still has the chest tube into the left side.  I am unsure when this will be taken out. ? ?We did send off the pathology for molecular analysis.  This probably will take a good 10-14 days. ? ?His nutritional state is actually a little better than I thought.  He has a prealbumin of 10.9.  Hopefully, this will improve.  I know that nutrition is following him closely.  He is on supplements. ? ?His labs today show sodium 137.  Potassium 3.1.  BUN 16 creatinine 0.91.  Calcium 10.4 with an albumin of 1.9.  Alkaline phosphatase is 128. ? ?He did have the bone scan done.  We do not have the results back as of yet. ? ?His CBC shows white cell count of 14.8.  Hemoglobin 10.5.  Platelet count 675,000. ? ?He does have an element of iron deficiency.  He has gotten IV iron.  His hemoglobin is improving nicely. ? ?His vital signs show temperature 97.9.  Pulse 64.  Blood pressure 103/69.  Oxygen level is 97%.  Overall, there really is no change in his physical exam.  He does have good air movement on the right side.  He has decent air movement on the left side of his lung.  Cardiac exam regular rate and rhythm.  There are no murmurs.  Abdomen is soft.  Bowel sounds are present.  Extremity shows some muscle atrophy in the upper and lower extremities.  Neurological exam is nonfocal. ? ?Lucas Burke has stage IV non-small cell lung cancer of the left lung.  His performance status is marginal (ECOG 2).  We really need to await the molecular analysis to see if he might be able to use immunotherapy and targeted therapy.  I realize this is a long shot in someone who has been smoking. ? ?Hopefully, the chest tube will come out soon. ? ?I am unsure what the outpatient living situation for him will be.  I think this is  going to have to be addressed. ? ?I know he is getting outstanding care from everybody up on [place]. ? ? ?Lucas Haw, MD ? ?2 Cor 3:17 ?

## 2021-06-04 NOTE — Progress Notes (Addendum)
? ?   ?  [address] ?      [name] [address] ?            [phone]   ? ?  ?3 Days Post-Op Procedure(s) (LRB): ?VIDEO ASSISTED THORACOSCOPY (VATS)/EMPYEMA - APPLICATION OF ON-Q PAIN PUMP (Left) ?DECORTICATION (Left) ?VIDEO BRONCHOSCOPY (N/A) ?PLEURAL BIOPSY (Left) ? ?Subjective: ? ?Patient doing better this morning, sitting up on side of bed eating breakfast.  He states chest tube pain isn't as bad.  He would like to be able to move his bowels today.   ? ?Objective: ?Vital signs in last 24 hours: ?Temp:  [97.9 ?F (36.6 ?C)-98.5 ?F (36.9 ?C)] 97.9 ?F (36.6 ?C) (03/31 0352) ?Pulse Rate:  [64-90] 64 (03/31 0352) ?Cardiac Rhythm: Normal sinus rhythm (03/31 0700) ?Resp:  [18-34] 19 (03/31 0352) ?BP: (103-112)/(64-79) 103/69 (03/31 0352) ?SpO2:  [94 %-98 %] 97 % (03/31 0352) ? ?Intake/Output from previous day: ?03/30 0701 - 03/31 0700 ?In: 3061.9 [P.O.:1200; I.V.:1461.9; IV Piggyback:400] ?Out: 4500 [Urine:4400; Chest Tube:100] ? ?General appearance: alert, cooperative, and no distress ?Heart: regular rate and rhythm ?Lungs: clear to auscultation bilaterally ?Abdomen: soft, non-tender; bowel sounds normal; no masses,  no organomegaly ?Extremities: extremities normal, atraumatic, no cyanosis or edema ?Wound: clean and dry ? ?Lab Results: ?Recent Labs  ?  06/03/21 ?0325 06/04/21 ?0421  ?WBC 15.9* 14.8*  ?HGB 9.4* 10.5*  ?HCT 28.8* 32.3*  ?PLT 691* 675*  ? ?BMET:  ?Recent Labs  ?  06/03/21 ?0325 06/04/21 ?0421  ?NA 137 137  ?K 3.8 3.1*  ?CL 101 94*  ?CO2 29 34*  ?GLUCOSE 84 93  ?BUN 17 16  ?CREATININE 0.97 0.91  ?CALCIUM 9.0 10.4*  ?  ?PT/INR: No results for input(s): LABPROT, INR in the last 72 hours. ?ABG ?   ?Component Value Date/Time  ? PHART 7.433 06/02/2021 0422  ? HCO3 29.0 (H) 06/02/2021 0422  ? TCO2 30 06/02/2021 0422  ? O2SAT 92 06/02/2021 0422  ? ?CBG (last 3)  ?Recent Labs  ?  06/03/21 ?3893  ?GLUCAP 87  ? ? ?Assessment/Plan: ?S/P Procedure(s) (LRB): ?VIDEO ASSISTED THORACOSCOPY  (VATS)/EMPYEMA - APPLICATION OF ON-Q PAIN PUMP (Left) ?DECORTICATION (Left) ?VIDEO BRONCHOSCOPY (N/A) ?PLEURAL BIOPSY (Left) ? ?CV- NSR, BP improved- remains on Midodrine ?Pulm- CT output 120 cc (level at 750 cc), CXR with persistent atelectasis,consolidation ?Renal-creatinine remains normal at 0.91, hypokalemic at 3.1, supplement ?Stage IV NSLC-oncology following, patient did not tolerate MRI for additional staging workup ?Dispo- patients chest tube output is stable at 120, hypokalemic due to diuresis, will need supplemented, CXR with persistent atelectasis, effusion, leave chest tube in place today... care per medicine ? ? LOS: 5 days  ? ? ?Ellwood Handler, PA-C ?06/04/2021 ? patient examined and medical record reviewed,agree with above note. ?Dahlia Byes ?06/04/2021 ?Chest tube drainage tapering off- wil,check CXR in am and remove tomorrow ?Remove On-Q catheter today ?Patient refuses Brain MRI- CT Head w/wo contrast ordered. ?Will see patient back in office with CXR and removal of chest tube suture in 10-14 days ? ?Dahlia Byes MD ? ?

## 2021-06-04 NOTE — Plan of Care (Signed)
°  Problem: Education: °Goal: Knowledge of General Education information will improve °Description: Including pain rating scale, medication(s)/side effects and non-pharmacologic comfort measures °Outcome: Progressing °  °Problem: Health Behavior/Discharge Planning: °Goal: Ability to manage health-related needs will improve °Outcome: Progressing °  °Problem: Clinical Measurements: °Goal: Ability to maintain clinical measurements within normal limits will improve °Outcome: Progressing °Goal: Will remain free from infection °Outcome: Progressing °Goal: Diagnostic test results will improve °Outcome: Progressing °Goal: Respiratory complications will improve °Outcome: Progressing °Goal: Cardiovascular complication will be avoided °Outcome: Progressing °  °Problem: Activity: °Goal: Risk for activity intolerance will decrease °Outcome: Progressing °  °Problem: Coping: °Goal: Level of anxiety will decrease °Outcome: Progressing °  °Problem: Elimination: °Goal: Will not experience complications related to bowel motility °Outcome: Progressing °  °Problem: Pain Managment: °Goal: General experience of comfort will improve °Outcome: Progressing °  °Problem: Safety: °Goal: Ability to remain free from injury will improve °Outcome: Progressing °  °Problem: Skin Integrity: °Goal: Risk for impaired skin integrity will decrease °Outcome: Progressing °  °

## 2021-06-04 NOTE — Progress Notes (Signed)
?                                  PROGRESS NOTE                                             ?                                                                                                                     ?                                         ? ? Patient Demographics:  ? ? Lucas Burke, is a 61 y.o. male, DOB - 1960-09-11, HUD:[id number] ? ?Outpatient Primary MD [facility] the [facility] is [facility] [place]    LOS - 5  Admit date - 05/30/2021   ? ?No chief complaint on file. ?    ? ?Brief Narrative (HPI from H&P) history 61 year old Caucasian male with history of chronic pain, panic attacks, anxiety, tremors who presented to [facility] hospital on 05/22/2021 with generalized weakness, his work-up was suggestive of left-sided empyema requiring chest tube placement, he was seen by pulmonary critical care and IR at [facility] and then transferred to [facility] on 05/30/2021 for further evaluation by cardiothoracic surgery and pulmonary critical team here.  Of note his CT scan done at [facility] is highly suspicious for left bronchus obstruction and underlying malignancy.  He is extremely weak and cachectic. ? ? Subjective:  ? ?Patient in bed, appears comfortable, denies any headache, no fever, mild L.sided chest tube site discomfort and pain, no shortness of breath , no abdominal pain. No new focal weakness. ? ? ? Assessment  & Plan :  ? ? ?Pneumonia, severe sepsis with left-sided empyema - he was seen by pulmonary critical care and IR team at [facility] underwent left-sided chest tube placement on 05/28/2021 with minimal improvement, now transferred to [facility] on 05/30/2021, currently on Unasyn, and by pulmonary and cardiothoracic surgery, s/p VATS procedure on 06/01/2021.  Chest tube in place, also question of non-small cell cancer on biopsies obtained during the VATS procedure and bronchoscopy.  Follow cultures. ? ?2.  CT scan done at [facility] highly  suspicious for left bronchial malignancy, bronchoscopy done during VATS procedure on 06/01/2021 with biopsies suggestive of possible squamous cell malignancy in the left bronchus.  Oncology on board, whole-body nuclear scan results are pending, could not get MRI brain due to severe claustrophobia despite premedication x2, CT brain now ordered, appreciate oncology follow-up. ? ?3. Severe protein calorie malnutrition and cachexia.  Placed on protein supplements. ?  ?4.  Severe hypercalcemia with appropriately suppressed PTH.  Stable ACE levels,  stable vitamin D 25 and calcitriol PTH RP levels, has been aggressively hydrated and given 3 doses of IV Salmon calcitonin calcium levels are better but still trending high will give Zometa today. ? ?5.  Weakness and deconditioning.  PT OT may need SNF. ? ?6.  Iron deficiency with perioperative acute blood loss.  On iron supplements orally, 1 unit of packed RBC given 06/02/2021 in the OR. ? ?7.  Chronic neck back pain.  Supportive care. ? ?8.  Suppressed PTH in response to hypercalcemia likely due to underlying malignancy.  Treat hypercalcemia. ? ?   ? ?Condition - Extremely Guarded ? ?Family Communication  : daughter [name] [phone] - 06/03/21 ? ?Code Status : Full ? ?Consults  : Pulmonary critical care, cardiothoracic surgery ? ?PUD Prophylaxis : PPI ? ? Procedures  :    ? ?CT Brain -  ? ?Whole body scan -  ? ?VATS procedure 06/01/2021 by cardiothoracic surgery  ? ?CT chest done at [facility] 05/27/2021.  Large loculated left-sided pleural effusion, possible obstruction of the left bronchus with mediastinal adenopathy on the left side.   ? ?05/28/2021.  CT-guided left chest tube placement by IR at [facility]. ? ?Thyroid ultrasound.  Unremarkable. ? ?   ? ?Disposition Plan  :   ? ?Status is: Inpatient ? ?DVT Prophylaxis  :   ? ?enoxaparin (LOVENOX) injection 40 mg Start: 06/03/21 1000 ?SCD's Start: 06/01/21 1557 ?  ? ?Lab Results  ?Component Value Date  ? PLT 675 (H)  06/04/2021  ? ? ?Diet :  ?Diet Order   ? ?       ?  Diet regular Room service appropriate? Yes; Fluid consistency: Thin  Diet effective now       ?  ? ?  ?  ? ?  ?  ? ?Inpatient Medications ? ?Scheduled Meds: ? (feeding supplement) PROSource Plus  30 mL Oral BID BM  ? acetaminophen  1,000 mg Oral Q6H  ? Or  ? acetaminophen (TYLENOL) oral liquid 160 mg/5 mL  1,000 mg Oral Q6H  ? bisacodyl  10 mg Oral Daily  ? Chlorhexidine Gluconate Cloth  6 each Topical Daily  ? enoxaparin (LOVENOX) injection  40 mg Subcutaneous Daily  ? feeding supplement  237 mL Oral TID BM  ? furosemide  40 mg Intravenous Q8H  ? gabapentin  300 mg Oral BID  ? midodrine  10 mg Oral TID WC  ? multivitamin with minerals  1 tablet Oral Daily  ? pantoprazole  40 mg Oral Daily  ? PARoxetine  20 mg Oral Daily  ? potassium chloride  20 mEq Oral Q4H  ? senna-docusate  1 tablet Oral QHS  ? sodium chloride flush  3 mL Intravenous Q12H  ? ?Continuous Infusions: ? sodium chloride 75 mL/hr at 06/03/21 2324  ? ampicillin-sulbactam (UNASYN) 3 g IVPB (Mini-Bag Plus) 3 g (06/04/21 0419)  ? bupivacaine 0.5 % ON-Q pump SINGLE CATH 400 mL    ? magnesium sulfate bolus IVPB    ? ?PRN Meds:.acetaminophen **OR** acetaminophen, ALPRAZolam, alum & mag hydroxide-simeth, fentaNYL, morphine injection, ondansetron (ZOFRAN) IV, ondansetron, oxyCODONE, polyethylene glycol, traMADol ? ?Antibiotics  :   ? ?Anti-infectives (From admission, onward)  ? ? Start     Dose/Rate Route Frequency Ordered Stop  ? 05/31/21 1651  ceFAZolin (ANCEF) IVPB 2g/100 mL premix       ? 2 g ?200 mL/hr over 30 Minutes Intravenous 30 min pre-op 05/31/21 1651 06/01/21 0842  ? 05/30/21 2200  Ampicillin-Sulbactam (UNASYN)  3 g in sodium chloride 0.9 % 100 mL IVPB       ? 3 g ?200 mL/hr over 30 Minutes Intravenous Every 6 hours 05/30/21 2044    ? ?  ? ? ? Time Spent in minutes  30 ? ? ?Lala Lund M.D on 06/04/2021 at 7:57 AM ? ?To page go to [email or web address]  ? ?[facility] -  Office   [phone] ? ?See all Orders from today for further details ? ? ? Objective:  ? ?Vitals:  ? 06/03/21 2300 06/04/21 0300 06/04/21 0352 06/04/21 0700  ?BP: 108/79 103/69 103/69 111/83  ?Pulse: 81  64   ?Resp: 19 (!) 34 19 20  ?Temp: 98.2 ?F (36.8 ?C)  97.9 ?F (36.6 ?C) 98.3 ?F (36.8 ?C)  ?TempSrc: Oral  Oral Oral  ?SpO2: 97%  97% 97%  ?Weight:      ?Height:      ? ? ?Wt Readings from Last 3 Encounters:  ?05/31/21 76 kg  ?05/22/21 71.4 kg  ?05/22/19 79.4 kg  ? ? ? ?Intake/Output Summary (Last 24 hours) at 06/04/2021 0757 ?Last data filed at 06/04/2021 0725 ?Gross per 24 hour  ?Intake 3061.88 ml  ?Output 5500 ml  ?Net -2438.12 ml  ? ? ? ?Physical Exam ? ?Middle-aged Caucasian male who appears ill and cachectic lying in hospital bed in no apparent distress, no focal deficits,  reduced left-sided breath sounds with left-sided chest tube in place. ?[place].AT,PERRAL ?Supple Neck, No JVD,   ?Symmetrical Chest wall movement, Good air movement bilaterally, CTAB ?RRR,No Gallops, Rubs or new Murmurs,  ?+ve B.Sounds, Abd Soft, No tenderness,   ?No Cyanosis, Clubbing or edema  ? ? ? ? Data Review:  ? ? ?CBC ?Recent Labs  ?Lab 06/01/21 ?0128 06/01/21 ?1634 06/02/21 ?0422 06/02/21 ?1601 06/03/21 ?0932 06/04/21 ?0421  ?WBC 15.7* 23.9*  --  17.5* 15.9* 14.8*  ?HGB 9.2* 9.7* 8.2* 7.8* 9.4* 10.5*  ?HCT 29.8* 30.7* 24.0* 25.9* 28.8* 32.3*  ?PLT 788* 840*  --  701* 691* 675*  ?MCV 83.2 83.0  --  84.6 83.5 82.2  ?MCH 25.7* 26.2  --  25.5* 27.2 26.7  ?MCHC 30.9 31.6  --  30.1 32.6 32.5  ?RDW 13.2 13.2  --  13.3 13.6 13.9  ?LYMPHSABS 1.7  --   --  1.3 1.6 2.0  ?MONOABS 1.5*  --   --  1.7* 1.6* 1.6*  ?EOSABS 0.3  --   --  0.0 0.2 0.2  ?BASOSABS 0.0  --   --  0.0 0.0 0.0  ? ? ?Electrolytes ?Recent Labs  ?Lab 05/29/21 ?3557 05/30/21 ?3220 05/31/21 ?2542 05/31/21 ?7062 05/31/21 ?1723 06/01/21 ?0128 06/02/21 ?0422 06/02/21 ?3762 06/03/21 ?8315 06/03/21 ?1761 06/04/21 ?0421  ?NA  --    < > 133*  --   --  133* 136 135 137  --  137  ?K  --    < > 4.4   --   --  4.6 4.6 4.5 3.8  --  3.1*  ?CL  --    < > 99  --   --  98  --  102 101  --  94*  ?CO2  --    < > 28  --   --  28  --  28 29  --  34*  ?GLUCOSE  --    < > 101*  --   --  95  --  139* 84  --  93  ?BUN  --    < >  19  --

## 2021-06-05 ENCOUNTER — Inpatient Hospital Stay (HOSPITAL_COMMUNITY): Payer: Medicaid Other

## 2021-06-05 LAB — COMPREHENSIVE METABOLIC PANEL
ALT: 30 U/L (ref 0–44)
AST: 22 U/L (ref 15–41)
Albumin: 2 g/dL — ABNORMAL LOW (ref 3.5–5.0)
Alkaline Phosphatase: 127 U/L — ABNORMAL HIGH (ref 38–126)
Anion gap: 8 (ref 5–15)
BUN: 21 mg/dL (ref 8–23)
CO2: 34 mmol/L — ABNORMAL HIGH (ref 22–32)
Calcium: 10.6 mg/dL — ABNORMAL HIGH (ref 8.9–10.3)
Chloride: 97 mmol/L — ABNORMAL LOW (ref 98–111)
Creatinine, Ser: 1.05 mg/dL (ref 0.61–1.24)
GFR, Estimated: >60 mL/min (ref >60)
Glucose, Bld: 106 mg/dL — ABNORMAL HIGH (ref 70–99)
Potassium: 3.8 mmol/L (ref 3.5–5.1)
Sodium: 139 mmol/L (ref 135–145)
Total Bilirubin: 0.5 mg/dL (ref 0.3–1.2)
Total Protein: 6.2 g/dL — ABNORMAL LOW (ref 6.5–8.1)

## 2021-06-05 LAB — MAGNESIUM: Magnesium: 1.6 mg/dL — ABNORMAL LOW (ref 1.7–2.4)

## 2021-06-05 MED ORDER — MAGNESIUM SULFATE 4 GM/100ML IV SOLN
4.0000 g | Freq: Once | INTRAVENOUS | Status: AC
  Administered 2021-06-05: 4 g via INTRAVENOUS
  Filled 2021-06-05: qty 100

## 2021-06-05 MED ORDER — MAGNESIUM SULFATE 4 GM/100ML IV SOLN
4.0000 g | Freq: Once | INTRAVENOUS | Status: DC
Start: 2021-06-05 — End: 2021-06-05

## 2021-06-05 MED ORDER — LACTATED RINGERS IV SOLN
INTRAVENOUS | Status: DC
Start: 2021-06-05 — End: 2021-06-06

## 2021-06-05 MED ORDER — CHLORHEXIDINE GLUCONATE CLOTH 2 % EX PADS: 6.0000 | MEDICATED_PAD | Freq: Every day | CUTANEOUS | Status: DC

## 2021-06-05 MED ORDER — POTASSIUM CHLORIDE CRYS ER 20 MEQ PO TBCR: 40.0000 meq | EXTENDED_RELEASE_TABLET | Freq: Once | ORAL | Status: DC

## 2021-06-05 MED ORDER — POTASSIUM CHLORIDE CRYS ER 20 MEQ PO TBCR
20.0000 meq | EXTENDED_RELEASE_TABLET | Freq: Once | ORAL | Status: DC
Start: 2021-06-05 — End: 2021-06-05

## 2021-06-05 MED ORDER — FUROSEMIDE 10 MG/ML IJ SOLN: 20.0000 mg | Freq: Once | INTRAMUSCULAR | Status: DC

## 2021-06-05 MED ORDER — LACTATED RINGERS IV BOLUS
500.0000 mL | Freq: Once | INTRAVENOUS | Status: AC
Start: 2021-06-05 — End: 2021-06-05
  Administered 2021-06-05: 500 mL via INTRAVENOUS

## 2021-06-05 MED ORDER — IOHEXOL 350 MG/ML SOLN
80.0000 mL | Freq: Once | INTRAVENOUS | Status: AC | PRN
  Administered 2021-06-05: 80 mL via INTRAVENOUS

## 2021-06-05 NOTE — Plan of Care (Signed)
°  Problem: Education: °Goal: Knowledge of General Education information will improve °Description: Including pain rating scale, medication(s)/side effects and non-pharmacologic comfort measures °Outcome: Progressing °  °Problem: Health Behavior/Discharge Planning: °Goal: Ability to manage health-related needs will improve °Outcome: Progressing °  °Problem: Clinical Measurements: °Goal: Ability to maintain clinical measurements within normal limits will improve °Outcome: Progressing °Goal: Will remain free from infection °Outcome: Progressing °Goal: Diagnostic test results will improve °Outcome: Progressing °Goal: Respiratory complications will improve °Outcome: Progressing °Goal: Cardiovascular complication will be avoided °Outcome: Progressing °  °Problem: Activity: °Goal: Risk for activity intolerance will decrease °Outcome: Progressing °  °Problem: Coping: °Goal: Level of anxiety will decrease °Outcome: Progressing °  °Problem: Elimination: °Goal: Will not experience complications related to bowel motility °Outcome: Progressing °  °Problem: Pain Managment: °Goal: General experience of comfort will improve °Outcome: Progressing °  °Problem: Safety: °Goal: Ability to remain free from injury will improve °Outcome: Progressing °  °Problem: Skin Integrity: °Goal: Risk for impaired skin integrity will decrease °Outcome: Progressing °  °

## 2021-06-05 NOTE — Progress Notes (Addendum)
? ?   ?  [address] ?      [name] [address] ?            [phone]   ? ?  ?3 Days Post-Op Procedure(s) (LRB): ?VIDEO ASSISTED THORACOSCOPY (VATS)/EMPYEMA - APPLICATION OF ON-Q PAIN PUMP (Left) ?DECORTICATION (Left) ?VIDEO BRONCHOSCOPY (N/A) ?PLEURAL BIOPSY (Left) ? ?Subjective: ? ?Just returned from brain CT and is resting quietly.  ?Denies any shortness of breath or pain.   ? ?Objective: ?Vital signs in last 24 hours: ?Temp:  [98.1 ?F (36.7 ?C)-98.7 ?F (37.1 ?C)] 98.3 ?F (36.8 ?C) (04/01 0411) ?Pulse Rate:  [64-76] 76 (04/01 0411) ?Cardiac Rhythm: Normal sinus rhythm (04/01 0728) ?Resp:  [16-20] 16 (04/01 0411) ?BP: (100-115)/(67-79) 103/67 (04/01 0411) ?SpO2:  [94 %-99 %] 97 % (04/01 0411) ? ?Intake/Output from previous day: ?03/31 0701 - 04/01 0700 ?In: 1354 [P.O.:1254; IV Piggyback:100] ?Out: 3604 [Urine:3470; Chest Tube:134] ? ?General appearance: alert, cooperative, and no distress ?Heart: regular rate and rhythm ?Lungs: clear to auscultation bilaterally ?Abdomen: soft, non-tender; bowel sounds normal; no masses,  no organomegaly ?Extremities: extremities normal, atraumatic, no cyanosis or edema ?Wound: clean and dry ? ?Lab Results: ?Recent Labs  ?  06/03/21 ?0325 06/04/21 ?0421  ?WBC 15.9* 14.8*  ?HGB 9.4* 10.5*  ?HCT 28.8* 32.3*  ?PLT 691* 675*  ? ? ?BMET:  ?Recent Labs  ?  06/04/21 ?0421 06/05/21 ?0611  ?NA 137 139  ?K 3.1* 3.8  ?CL 94* 97*  ?CO2 34* 34*  ?GLUCOSE 93 106*  ?BUN 16 21  ?CREATININE 0.91 1.05  ?CALCIUM 10.4* 10.6*  ? ?  ?PT/INR: No results for input(s): LABPROT, INR in the last 72 hours. ?ABG ?   ?Component Value Date/Time  ? PHART 7.433 06/02/2021 0422  ? HCO3 29.0 (H) 06/02/2021 0422  ? TCO2 30 06/02/2021 0422  ? O2SAT 92 06/02/2021 0422  ? ?CBG (last 3)  ?Recent Labs  ?  06/03/21 ?3154  ?GLUCAP 87  ? ? ? ?Assessment/Plan: ?S/P Procedure(s) (LRB): ?VIDEO ASSISTED THORACOSCOPY (VATS)/EMPYEMA - APPLICATION OF ON-Q PAIN PUMP (Left) ?DECORTICATION (Left) ?VIDEO  BRONCHOSCOPY (N/A) ?PLEURAL BIOPSY (Left) ? ?CV- NSR, BP 00-867 systolic- remains on Midodrine ?Pulm- CT output 170ml past 24 hours. CXR without change. Will remove the chest tube today. ?Renal-creatinine stable.  Hypokalemia corrected. 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 Chemotherapy and immunotherapy are being considered. The oncologist does not feel he would tolerate a combination of radiation and chemo.  Patient had the CT head this morning showing no metastatic disease. ? ? ? LOS: 6 days  ? ? ?Antony Odea, PA-C ?06/05/2021 ?  ? ? ?Agree with above ?Will remove CT today ?Dispo planning ? ?Lajuana Matte ? ?

## 2021-06-05 NOTE — Progress Notes (Signed)
Ben RN aware of order to dc the CVC. ? ?

## 2021-06-05 NOTE — Progress Notes (Signed)
?                                  PROGRESS NOTE                                             ?                                                                                                                     ?                                         ? ? Patient Demographics:  ? ? Lucas Burke, is a 61 y.o. male, DOB - [birth year], [id number] ? ?Outpatient Primary MD for the patient is [facility] [place]    LOS - 6  Admit date - 05/30/2021   ? ?No chief complaint on file. ?    ? ?Brief Narrative (HPI from H&P) history 61 year old Caucasian male with history of chronic pain, panic attacks, anxiety, tremors who presented to [facility] hospital on 05/22/2021 with generalized weakness, his work-up was suggestive of left-sided empyema requiring chest tube placement, he was seen by pulmonary critical care and IR at [facility] and then transferred to [facility] on 05/30/2021 for further evaluation by cardiothoracic surgery and pulmonary critical team here.  Of note his CT scan done at [facility] is highly suspicious for left bronchus obstruction and underlying malignancy.  He is extremely weak and cachectic. ? ? Subjective:  ? ?Patient in bed, appears comfortable, denies any headache, no fever, no chest pain or pressure, no shortness of breath , no abdominal pain. No new focal weakness. ? ? Assessment  & Plan :  ? ? ?Pneumonia, severe sepsis with left-sided empyema - he was seen by pulmonary critical care and IR team at [facility] underwent left-sided chest tube placement on 05/28/2021 with minimal improvement, now transferred to [facility] on 05/30/2021, currently on Unasyn, and by pulmonary and cardiothoracic surgery, s/p VATS procedure on 06/01/2021.  Chest tube in place, also question of non-small cell cancer on biopsies obtained during the VATS procedure and bronchoscopy.  Follow cultures.  White count and procalcitonin trending towards improvement will  likely discontinue antibiotics after a total of 7 days here. ? ?2.  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  Oncology on board, could not get MRI brain due to severe claustrophobia despite premedication x2, CT brain now ordered with pending results of whole-body bone scan, appreciate oncology follow-up. ? ?3. Severe protein calorie malnutrition and cachexia.  Placed on  protein supplements. ?  ?4.  Severe hypercalcemia with appropriately suppressed PTH.  Stable ACE levels, stable vitamin D 25 and calcitriol PTH RP levels, has been aggressively hydrated and given 3 doses of IV Salmon calcitonin calcium levels are better but still trending high will give Zometa on 06/04/2021, will continue fluids on 06/05/2021. ? ?5.  Weakness and deconditioning.  PT OT may need SNF. ? ?6.  Iron deficiency with perioperative acute blood loss.  On iron supplements orally, 1 unit of packed RBC given 06/02/2021 in the OR. ? ?7.  Chronic neck back pain.  Supportive care. ? ?8.  Suppressed PTH in response to hypercalcemia likely due to underlying malignancy.  Treat hypercalcemia. ? ?   ? ?Condition - Extremely Guarded ? ?Family Communication  : daughter [name] [phone] - 06/03/21 ? ?Code Status : Full ? ?Consults  : Pulmonary critical care, cardiothoracic surgery ? ?PUD Prophylaxis : PPI ? ? Procedures  :    ? ?CT Brain -  ? ?Whole body bone scan -  ? ?VATS procedure 06/01/2021 by cardiothoracic surgery  ? ?CT chest done at [facility] 05/27/2021.  Large loculated left-sided pleural effusion, possible obstruction of the left bronchus with mediastinal adenopathy on the left side.   ? ?05/28/2021.  CT-guided left chest tube placement by IR at [facility]. ? ?Thyroid ultrasound.  Unremarkable. ? ?   ? ?Disposition Plan  :   ? ?Status is: Inpatient ? ?DVT Prophylaxis  :    ? ?enoxaparin (LOVENOX) injection 40 mg Start: 06/03/21 1000 ?SCD's Start: 06/01/21 1557 ?  ? ?Lab Results  ?Component Value Date  ? PLT 675 (H) 06/04/2021  ? ? ?Diet :  ?Diet Order   ? ?       ?  Diet regular Room service appropriate? Yes; Fluid consistency: Thin  Diet effective now       ?  ? ?  ?  ? ?  ?  ? ?Inpatient Medications ? ?Scheduled Meds: ? (feeding supplement) PROSource Plus  30 mL Oral BID BM  ? acetaminophen  1,000 mg Oral Q6H  ? Or  ? acetaminophen (TYLENOL) oral liquid 160 mg/5 mL  1,000 mg Oral Q6H  ? bisacodyl  10 mg Oral Daily  ? Chlorhexidine Gluconate Cloth  6 each Topical Daily  ? enoxaparin (LOVENOX) injection  40 mg Subcutaneous Daily  ? feeding supplement  237 mL Oral TID BM  ? furosemide  40 mg Intravenous Q8H  ? gabapentin  300 mg Oral BID  ? midodrine  10 mg Oral TID WC  ? multivitamin with minerals  1 tablet Oral Daily  ? pantoprazole  40 mg Oral Daily  ? PARoxetine  20 mg Oral Daily  ? senna-docusate  1 tablet Oral QHS  ? sodium chloride flush  3 mL Intravenous Q12H  ? ?Continuous Infusions: ? ampicillin-sulbactam (UNASYN) 3 g IVPB (Mini-Bag Plus) 3 g (06/05/21 0835)  ? bupivacaine 0.5 % ON-Q pump SINGLE CATH 400 mL    ? ?PRN Meds:.acetaminophen **OR** acetaminophen, ALPRAZolam, alum & mag hydroxide-simeth, fentaNYL, morphine injection, ondansetron (ZOFRAN) IV, ondansetron, oxyCODONE, polyethylene glycol, traMADol ? ?Antibiotics  :   ? ?Anti-infectives (From admission, onward)  ? ? Start     Dose/Rate Route Frequency Ordered Stop  ? 05/31/21 1651  ceFAZolin (ANCEF) IVPB 2g/100 mL premix       ? 2 g ?200 mL/hr over 30 Minutes Intravenous 30 min pre-op 05/31/21 1651 06/01/21 0842  ? 05/30/21 2200  Ampicillin-Sulbactam (UNASYN) 3 g in  sodium chloride 0.9 % 100 mL IVPB       ? 3 g ?200 mL/hr over 30 Minutes Intravenous Every 6 hours 05/30/21 2044    ? ?  ? ? ? Time Spent in minutes  30 ? ? ?Lala Lund M.D on 06/05/2021 at 9:16 AM ? ?To page go to [email or web address]  ? ?[facility] -   Office  [phone] ? ?See all Orders from today for further details ? ? ? Objective:  ? ?Vitals:  ? 06/04/21 1600 06/04/21 1932 06/04/21 2335 06/05/21 0411  ?BP: 106/79 115/72 101/72 103/67  ?Pulse:  75 71 76  ?Resp: 16 18 18 16   ?Temp: 98.3 ?F (36.8 ?C) 98.3 ?F (36.8 ?C) 98.1 ?F (36.7 ?C) 98.3 ?F (36.8 ?C)  ?TempSrc: Oral Oral Oral Oral  ?SpO2: 94% 99% 98% 97%  ?Weight:      ?Height:      ? ? ?Wt Readings from Last 3 Encounters:  ?05/31/21 76 kg  ?05/22/21 71.4 kg  ?05/22/19 79.4 kg  ? ? ? ?Intake/Output Summary (Last 24 hours) at 06/05/2021 0916 ?Last data filed at 06/05/2021 0524 ?Gross per 24 hour  ?Intake 1254 ml  ?Output 2604 ml  ?Net -1350 ml  ? ? ? ?Physical Exam ? ?Middle-aged Caucasian male who appears ill and cachectic lying in hospital bed in no apparent distress, no focal deficits,  reduced left-sided breath sounds with left-sided chest tube in place. ?[place].AT,PERRAL ?Supple Neck, No JVD,   ?Symmetrical Chest wall movement, Good air movement bilaterally, CTAB ?RRR,No Gallops, Rubs or new Murmurs,  ?+ve B.Sounds, Abd Soft, No tenderness,   ?No Cyanosis, Clubbing or edema  ? ? ? ? Data Review:  ? ? ?CBC ?Recent Labs  ?Lab 06/01/21 ?0128 06/01/21 ?1634 06/02/21 ?0422 06/02/21 ?9485 06/03/21 ?4627 06/04/21 ?0421  ?WBC 15.7* 23.9*  --  17.5* 15.9* 14.8*  ?HGB 9.2* 9.7* 8.2* 7.8* 9.4* 10.5*  ?HCT 29.8* 30.7* 24.0* 25.9* 28.8* 32.3*  ?PLT 788* 840*  --  701* 691* 675*  ?MCV 83.2 83.0  --  84.6 83.5 82.2  ?MCH 25.7* 26.2  --  25.5* 27.2 26.7  ?MCHC 30.9 31.6  --  30.1 32.6 32.5  ?RDW 13.2 13.2  --  13.3 13.6 13.9  ?LYMPHSABS 1.7  --   --  1.3 1.6 2.0  ?MONOABS 1.5*  --   --  1.7* 1.6* 1.6*  ?EOSABS 0.3  --   --  0.0 0.2 0.2  ?BASOSABS 0.0  --   --  0.0 0.0 0.0  ? ? ?Electrolytes ?Recent Labs  ?Lab 05/31/21 ?0350 05/31/21 ?1723 06/01/21 ?0128 06/02/21 ?0422 06/02/21 ?0938 06/03/21 ?1829 06/03/21 ?9371 06/04/21 ?6967 06/05/21 ?8938  ?NA  --   --  133* 136 135 137  --  137 139  ?K  --   --  4.6 4.6 4.5 3.8  --  3.1*  3.8  ?CL  --   --  98  --  102 101  --  94* 97*  ?CO2  --   --  28  --  28 29  --  34* 34*  ?GLUCOSE  --   --  95  --  139* 84  --  93 106*  ?BUN  --   --  20  --  19 17  --  16 21  ?CREATININE  --   --  0

## 2021-06-05 NOTE — Progress Notes (Signed)
Physical Therapy Treatment ?Patient Details ?Name: Lucas Burke ?MRN: [id number] ?DOB: [birth year] ?Today's Date: 06/05/2021 ? ? ?History of Present Illness 61 y.o. male presented 05/22/21 with generalized weakness. Adm for sepsis due to pna; large left empyema; chest tube. PMH significant of chronic pain, panic attacks, anxiety, tremors ? ?  ?PT Comments  ? ? Pt received in supine, agreeable to therapy session and with good participation and tolerance for transfer training. Pt limited due to bowel urgency/diarrhea and gait progression limited to pivotal steps to/from [facility] with up to min guard for safety. Pt able to perform seated BLE exercises and reciprocal transfers with good tolerance, encouraged pt to continue supine/seated exercises TID for maintenance of strength. SpO2 WFL on RA and BP soft but stable post-exertion. Pt continues to benefit from PT services to progress toward functional mobility goals.   ?Recommendations for follow up therapy are one component of a multi-disciplinary discharge planning process, led by the attending physician.  Recommendations may be updated based on patient status, additional functional criteria and insurance authorization. ? ?Follow Up Recommendations ? Home health PT ?  ?  ?Assistance Recommended at Discharge Intermittent Supervision/Assistance  ?Patient can return home with the following Assistance with cooking/housework;Help with stairs or ramp for entrance;A little help with bathing/dressing/bathroom;Assist for transportation ?  ?Equipment Recommendations ? Rolling walker (2 wheels)  ?  ?Recommendations for Other Services   ? ? ?  ?Precautions / Restrictions Precautions ?Precautions: Fall ?Precaution Comments: Chest tube removed >1 hr prior, dressing c/d/i ?Restrictions ?Weight Bearing Restrictions: No  ?  ? ?Mobility ? Bed Mobility ?Overal bed mobility: Needs Assistance ?Bed Mobility: Supine to Sit, Sit to Supine ?  ?  ?Supine to sit: Supervision, HOB elevated ?Sit to supine:  Min guard, HOB elevated ?  ?General bed mobility comments: increased time, use of bed features ?  ? ?Transfers ?Overall transfer level: Needs assistance ?Equipment used: None ?Transfers: Sit to/from Stand, Bed to chair/wheelchair/BSC ?Sit to Stand: Supervision ?  ?Step pivot transfers: Min guard ?  ?  ?  ?General transfer comment: Supervision for safety and line management, increased time to perform ?  ? ?Ambulation/Gait ?  ?  ?General Gait Details: pt defer due to diarrhea upon standing and bowel urgency ? ? ?  ?Balance Overall balance assessment: Needs assistance ?Sitting-balance support: No upper extremity supported, Feet supported ?Sitting balance-Leahy Scale: Good ?  ?  ?Standing balance support: Bilateral upper extremity supported, During functional activity, Reliant on assistive device for balance ?Standing balance-Leahy Scale: Fair ?Standing balance comment: able to pivot without BUE support and min guard for safety ?  ?  ? ?  ?Cognition Arousal/Alertness: Awake/alert ?Behavior During Therapy: Flat affect ?Overall Cognitive Status: Within Functional Limits for tasks assessed ?  ?  ?  ?General Comments: Pt appears appropriate, some slow processing; mild BUE tremors ?  ?  ? ?  ?Exercises Other Exercises ?Other Exercises: STS x 5 reciprocal reps ?Other Exercises: seated BLE AROM: LAQ, hip flexion x10 reps ? ?  ?General Comments General comments (skin integrity, edema, etc.): SpO2 92-95% on RA; BP 102/62 (73) post-exertion in supine ?  ?  ? ?Pertinent Vitals/Pain Pain Assessment ?Pain Assessment: Faces ?Faces Pain Scale: Hurts little more ?Pain Location: chest tube site ?Pain Descriptors / Indicators: Grimacing, Aching ?Pain Intervention(s): Monitored during session, Repositioned, Limited activity within patient's tolerance  ? ? ? ?PT Goals (current goals can now be found in the care plan section) Acute Rehab PT Goals ?Patient Stated Goal: to improve ?  PT Goal Formulation: With patient ?Time For Goal Achievement:  06/14/21 ?Progress towards PT goals: Progressing toward goals (slow progress, bowel incontinence limiting) ? ?  ?Frequency ? ? ? Min 3X/week ? ? ? ?  ?PT Plan Discharge plan needs to be updated  ? ? ?   ?AM-PAC PT "6 Clicks" Mobility   ?Outcome Measure ? Help needed turning from your back to your side while in a flat bed without using bedrails?: None ?Help needed moving from lying on your back to sitting on the side of a flat bed without using bedrails?: A Little ?Help needed moving to and from a bed to a chair (including a wheelchair)?: A Little ?Help needed standing up from a chair using your arms (e.g., wheelchair or bedside chair)?: A Little ?Help needed to walk in hospital room?: A Little ?Help needed climbing 3-5 steps with a railing? : A Lot ?6 Click Score: 18 ? ?  ?End of Session   ?Activity Tolerance: Patient tolerated treatment well ?Patient left: in bed;with call bell/phone within reach;with bed alarm set ?Nurse Communication: Mobility status ?PT Visit Diagnosis: Difficulty in walking, not elsewhere classified (R26.2);Unsteadiness on feet (R26.81) ?  ? ? ?Time: 3154-0086 ?PT Time Calculation (min) (ACUTE ONLY): 27 min ? ?Charges:  $Therapeutic Exercise: 8-22 mins ?$Therapeutic Activity: 8-22 mins          ?          ? ?Antiono Ettinger P., PTA ?Acute Rehabilitation Services ?Secure Chat Preferred 9a-5:30pm ?Office: [phone]  ? ? ?[name] ?06/05/2021, 4:19 PM ? ?

## 2021-06-06 ENCOUNTER — Inpatient Hospital Stay (HOSPITAL_COMMUNITY): Payer: Medicaid Other

## 2021-06-06 LAB — COMPREHENSIVE METABOLIC PANEL
ALT: 27 U/L (ref 0–44)
AST: 20 U/L (ref 15–41)
Albumin: 1.8 g/dL — ABNORMAL LOW (ref 3.5–5.0)
Alkaline Phosphatase: 112 U/L (ref 38–126)
Anion gap: 12 (ref 5–15)
BUN: 21 mg/dL (ref 8–23)
CO2: 29 mmol/L (ref 22–32)
Calcium: 8.9 mg/dL (ref 8.9–10.3)
Chloride: 94 mmol/L — ABNORMAL LOW (ref 98–111)
Creatinine, Ser: 1.13 mg/dL (ref 0.61–1.24)
GFR, Estimated: >60 mL/min (ref >60)
Glucose, Bld: 100 mg/dL — ABNORMAL HIGH (ref 70–99)
Potassium: 4.3 mmol/L (ref 3.5–5.1)
Sodium: 135 mmol/L (ref 135–145)
Total Bilirubin: 0.4 mg/dL (ref 0.3–1.2)
Total Protein: 5.3 g/dL — ABNORMAL LOW (ref 6.5–8.1)

## 2021-06-06 LAB — ANAEROBIC CULTURE W GRAM STAIN: Gram Stain: NONE SEEN

## 2021-06-06 LAB — CBC
HCT: 30.9 % — ABNORMAL LOW (ref 39.0–52.0)
Hemoglobin: 9.7 g/dL — ABNORMAL LOW (ref 13.0–17.0)
MCH: 26.7 pg (ref 26.0–34.0)
MCHC: 31.4 g/dL (ref 30.0–36.0)
MCV: 85.1 fL (ref 80.0–100.0)
Platelets: 611 10*3/uL — ABNORMAL HIGH (ref 150–400)
RBC: 3.63 MIL/uL — ABNORMAL LOW (ref 4.22–5.81)
RDW: 14.4 % (ref 11.5–15.5)
WBC: 11.8 10*3/uL — ABNORMAL HIGH (ref 4.0–10.5)
nRBC: 0 % (ref 0.0–0.2)

## 2021-06-06 LAB — MAGNESIUM: Magnesium: 1.9 mg/dL (ref 1.7–2.4)

## 2021-06-06 MED ORDER — MIDODRINE HCL 5 MG PO TABS
5.0000 mg | ORAL_TABLET | Freq: Three times a day (TID) | ORAL | Status: DC
  Administered 2021-06-06 – 2021-06-08 (×6): 5 mg via ORAL
  Filled 2021-06-06 (×6): qty 1

## 2021-06-06 MED ORDER — OXYCODONE HCL 5 MG PO TABS
5.0000 mg | ORAL_TABLET | Freq: Four times a day (QID) | ORAL | Status: DC | PRN
  Administered 2021-06-06 – 2021-06-08 (×5): 5 mg via ORAL
  Filled 2021-06-06 (×5): qty 1

## 2021-06-06 NOTE — Progress Notes (Addendum)
? ?   ?  [address] ?      [name] [address] ?            [phone]   ? ?  ?3 Days Post-Op Procedure(s) (LRB): ?VIDEO ASSISTED THORACOSCOPY (VATS)/EMPYEMA - APPLICATION OF ON-Q PAIN PUMP (Left) ?DECORTICATION (Left) ?VIDEO BRONCHOSCOPY (N/A) ?PLEURAL BIOPSY (Left) ? ?Subjective: ? ?Awake and alert, says he is more comfortable after removal of the pleural tube.  ?Denies any shortness of breath or pain.   ? ?Objective: ?Vital signs in last 24 hours: ?Temp:  [97.8 ?F (36.6 ?C)-99.4 ?F (37.4 ?C)] 99.4 ?F (37.4 ?C) (04/02 0418) ?Pulse Rate:  [68-84] 68 (04/02 0805) ?Cardiac Rhythm: Normal sinus rhythm (04/02 0717) ?Resp:  [16-20] 20 (04/02 0805) ?BP: (92-111)/(57-71) 92/61 (04/02 0805) ?SpO2:  [90 %-93 %] 90 % (04/02 0418) ? ?Intake/Output from previous day: ?04/01 0701 - 04/02 0700 ?In: 2792.4 [I.V.:1504.5; IV Piggyback:1287.9] ?Out: -  ? ?General appearance: alert, cooperative, and no distress ?Heart: regular rate and rhythm ?Lungs: clear breath sounds on right,  diminished / absent on the left.  ?Abdomen: soft, non-tender ?Extremities: extremities normal, atraumatic, no cyanosis or edema ?Chest Wounds: clean and dry. CT site is dry. ? ?Lab Results: ?Recent Labs  ?  06/04/21 ?0421 06/06/21 ?0541  ?WBC 14.8* 11.8*  ?HGB 10.5* 9.7*  ?HCT 32.3* 30.9*  ?PLT 675* 611*  ? ? ?BMET:  ?Recent Labs  ?  06/05/21 ?0611 06/06/21 ?0541  ?NA 139 135  ?K 3.8 4.3  ?CL 97* 94*  ?CO2 34* 29  ?GLUCOSE 106* 100*  ?BUN 21 21  ?CREATININE 1.05 1.13  ?CALCIUM 10.6* 8.9  ? ?  ?PT/INR: No results for input(s): LABPROT, INR in the last 72 hours. ?ABG ?   ?Component Value Date/Time  ? PHART 7.433 06/02/2021 0422  ? HCO3 29.0 (H) 06/02/2021 0422  ? TCO2 30 06/02/2021 0422  ? O2SAT 92 06/02/2021 0422  ? ?CBG (last 3)  ?Recent Labs  ?  06/03/21 ?8850  ?GLUCAP 87  ? ? ? ?Assessment/Plan: ?S/P Procedure(s) (LRB): ?VIDEO ASSISTED THORACOSCOPY (VATS)/EMPYEMA - APPLICATION OF ON-Q PAIN PUMP (Left) ?DECORTICATION (Left) ?VIDEO  BRONCHOSCOPY (N/A) ?PLEURAL BIOPSY (Left) ? ?CV- NSR, BP 27-741 systolic- remains on Midodrine ?Pulm- CT removed. Left chest completely opacified.  ?Renal-creatinine stable.  Hypokalemia corrected. 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 Chemotherapy and immunotherapy are being considered. Unfortunately, we have nothing further to offer from surgical standpoint. A Palliative Care consult may be appropriate to establish goals of care but will defer to the primary team. Mr. Broadus daughter, [name] [phone]), is an Therapist, sports and is very interested in discussing treatment plans and prognosis.  She is also caring for her mother who is currently hospitalized in [place] with anoxic brain injury after an MI.  We will continue follow peripherally.  ? ? LOS: 7 days  ? ?Antony Odea, PA-C ?06/06/2021 ?  ? ?Agree with above ?CXR stable after CT removal ?Please call with questions ? ?Lajuana Matte ? ? ? ?

## 2021-06-06 NOTE — Social Work (Signed)
?  CSW was alerted in order to receive an update about a SNF bed. Through review of the chart, it is noted that pt has a HH PT recommendation. CSW was informed that pt may be on IV antibiotics and CSW confirmed with MD that pt would not be on IV antibiotics. CSW spoke with pt's daughterLance Muss and explained the recommendation of [facility] and that pt would not be on IV antibiotics.[name] requested a call from The MD wanting to know why the treatment has been changed. [name] explained to [place] that she is actively looking for a place for client due to home having sewage issues. She stated that pt could go stay with a Aunt temporarily however she probably would not want [facility] services coming into the home. CSW alerted MD for request to call to [facility] ?

## 2021-06-06 NOTE — Plan of Care (Signed)
°  Problem: Education: °Goal: Knowledge of General Education information will improve °Description: Including pain rating scale, medication(s)/side effects and non-pharmacologic comfort measures °Outcome: Progressing °  °Problem: Health Behavior/Discharge Planning: °Goal: Ability to manage health-related needs will improve °Outcome: Progressing °  °Problem: Clinical Measurements: °Goal: Ability to maintain clinical measurements within normal limits will improve °Outcome: Progressing °Goal: Will remain free from infection °Outcome: Progressing °Goal: Diagnostic test results will improve °Outcome: Progressing °Goal: Respiratory complications will improve °Outcome: Progressing °Goal: Cardiovascular complication will be avoided °Outcome: Progressing °  °Problem: Activity: °Goal: Risk for activity intolerance will decrease °Outcome: Progressing °  °Problem: Coping: °Goal: Level of anxiety will decrease °Outcome: Progressing °  °Problem: Elimination: °Goal: Will not experience complications related to bowel motility °Outcome: Progressing °  °Problem: Pain Managment: °Goal: General experience of comfort will improve °Outcome: Progressing °  °Problem: Safety: °Goal: Ability to remain free from injury will improve °Outcome: Progressing °  °Problem: Skin Integrity: °Goal: Risk for impaired skin integrity will decrease °Outcome: Progressing °  °

## 2021-06-06 NOTE — Progress Notes (Addendum)
?                                  PROGRESS NOTE                                             ?                                                                                                                     ?                                         ? ? Patient Demographics:  ? ? Lucas Burke, is a 61 y.o. male, DOB - 04/13/1960, OJJ:[id number] ? ?[facility] Primary [facility] for the [facility] is [facility]    LOS - 7  Admit date - 05/30/2021   ? ?No chief complaint on file. ?    ? ?Brief Narrative (HPI from H&P) history 61 year old Caucasian male with history of chronic pain, panic attacks, anxiety, tremors who presented to [facility] on 05/22/2021 with generalized weakness, his work-up was suggestive of left-sided empyema requiring chest tube placement, he was seen by pulmonary critical care and IR at [facility] and then transferred to [facility] on 05/30/2021 for further evaluation by cardiothoracic surgery and pulmonary critical team here.  Of note his CT scan done at [facility] is highly suspicious for left bronchus obstruction and underlying malignancy.  He is extremely weak and cachectic. ? ? Subjective:  ? ?Patient in bed, appears comfortable, denies any headache, no fever, no chest pain or pressure, no shortness of breath , no abdominal pain. No new focal weakness. ? ? ? Assessment  & Plan :  ? ? ?Pneumonia, severe sepsis with left-sided empyema versus malignant effusion- he was seen by pulmonary critical care and IR team at [facility] underwent left-sided chest tube placement on 05/28/2021 with minimal improvement, now transferred to [facility] on 05/30/2021, he was transferred on Unasyn, seen here by pulmonary and cardiothoracic surgery, s/p VATS procedure on 06/01/2021.  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   Chest tube removed by cardiothoracic surgery on 06/05/2021.  Follow cultures.  White count and procalcitonin trending improved stop Unasyn on 06/06/2021.  Will discuss with cardiothoracic surgery as I question if he requires long-term antibiotics in the first place.  None of the blood cultures or cultures from the pleural fluid has grown anything. ? ?2.  CT scan done at [facility] highly suspicious for left bronchial malignancy, bronchoscopy done during VATS procedure on 06/01/2021 with biopsies suggestive of possible squamous  cell malignancy in the left bronchus.  Oncology on board, could not get MRI brain and whole-body bone scan due to severe claustrophobia despite premedication x2, CT scan of the brain unremarkable,  appreciate oncology follow-up. ? ?3. Severe protein calorie malnutrition and cachexia.  Placed on protein supplements. ?  ?4.  Severe hypercalcemia with appropriately suppressed PTH.  Stable ACE levels, stable vitamin D 25 and calcitriol PTH RP levels, has been aggressively hydrated and given 3 doses of IV [place] calcitonin calcium levels are better but still trending high will give Zometa on 06/04/2021, will continue fluids on 06/05/2021. ? ?5.  Weakness and deconditioning.  PT OT may need SNF.  Likely discharge on 06/07/2021 if bed available. ? ?6.  Iron deficiency with perioperative acute blood loss.  On iron supplements orally, 1 unit of packed RBC given 06/02/2021 in the OR. ? ?7.  Chronic neck back pain.  Supportive care. ? ?8.  Suppressed PTH in response to hypercalcemia likely due to underlying malignancy.  Treat hypercalcemia. ? ?   ? ?Condition - Extremely Guarded ? ?Family Communication  : daughter [name] [phone] - 06/03/21 ? ?Code Status : Full ? ?Consults  : Pulmonary critical care, cardiothoracic surgery ? ?PUD Prophylaxis : PPI ? ? Procedures  :    ? ?CT Brain -  ? ?Whole body bone scan -  ? ?VATS procedure 06/01/2021 by cardiothoracic surgery  ? ?CT chest done at [facility] 05/27/2021.  Large  loculated left-sided pleural effusion, possible obstruction of the left bronchus with mediastinal adenopathy on the left side.   ? ?05/28/2021.  CT-guided left chest tube placement by IR at [facility]. ? ?Thyroid ultrasound.  Unremarkable. ? ?   ? ?Disposition Plan  :   ? ?Status is: Inpatient ? ?DVT Prophylaxis  :   ? ?enoxaparin (LOVENOX) injection 40 mg Start: 06/03/21 1000 ?SCD's Start: 06/01/21 1557 ?  ? ?Lab Results  ?Component Value Date  ? PLT 611 (H) 06/06/2021  ? ? ?Diet :  ?Diet Order   ? ?       ?  Diet regular Room service appropriate? Yes; Fluid consistency: Thin  Diet effective now       ?  ? ?  ?  ? ?  ?  ? ?Inpatient Medications ? ?Scheduled Meds: ? (feeding supplement) PROSource Plus  30 mL Oral BID BM  ? acetaminophen  1,000 mg Oral Q6H  ? Or  ? acetaminophen (TYLENOL) oral liquid 160 mg/5 mL  1,000 mg Oral Q6H  ? bisacodyl  10 mg Oral Daily  ? enoxaparin (LOVENOX) injection  40 mg Subcutaneous Daily  ? feeding supplement  237 mL Oral TID BM  ? gabapentin  300 mg Oral BID  ? midodrine  5 mg Oral TID WC  ? multivitamin with minerals  1 tablet Oral Daily  ? pantoprazole  40 mg Oral Daily  ? PARoxetine  20 mg Oral Daily  ? senna-docusate  1 tablet Oral QHS  ? sodium chloride flush  3 mL Intravenous Q12H  ? ?Continuous Infusions: ? bupivacaine 0.5 % ON-Q pump SINGLE CATH 400 mL    ? ?PRN Meds:.acetaminophen **OR** acetaminophen, ALPRAZolam, alum & mag hydroxide-simeth, ondansetron (ZOFRAN) IV, ondansetron, oxyCODONE, polyethylene glycol, traMADol ? ?Antibiotics  :   ? ?Anti-infectives (From admission, onward)  ? ? Start     Dose/Rate Route Frequency Ordered Stop  ? 05/31/21 1651  ceFAZolin (ANCEF) IVPB 2g/100 mL premix       ? 2 g ?200 mL/hr  over 30 Minutes Intravenous 30 min pre-op 05/31/21 1651 06/01/21 0842  ? 05/30/21 2200  Ampicillin-Sulbactam (UNASYN) 3 g in sodium chloride 0.9 % 100 mL IVPB  Status:  Discontinued       ? 3 g ?200 mL/hr over 30 Minutes Intravenous Every 6 hours 05/30/21  2044 06/06/21 1029  ? ?  ? ? ? Time Spent in minutes  30 ? ? ?Lala Lund M.D on 06/06/2021 at 10:31 AM ? ?To page go to [email or web address]  ? ?[facility] -  Office  [phone] ? ?See all Orders from today for further details ? ? ? Objective:  ? ?Vitals:  ? 06/05/21 1938 06/05/21 2320 06/06/21 0418 06/06/21 0805  ?BP: (!) 106/57 111/67 93/66 92/61   ?Pulse: 70 84 78 68  ?Resp:  16 18 20   ?Temp: 97.8 ?F (36.6 ?C) 98.7 ?F (37.1 ?C) 99.4 ?F (37.4 ?C)   ?TempSrc: Oral Oral Oral   ?SpO2: 91% 93% 90%   ?Weight:      ?Height:      ? ? ?Wt Readings from Last 3 Encounters:  ?05/31/21 76 kg  ?05/22/21 71.4 kg  ?05/22/19 79.4 kg  ? ? ? ?Intake/Output Summary (Last 24 hours) at 06/06/2021 1031 ?Last data filed at 06/06/2021 0432 ?Gross per 24 hour  ?Intake 2792.37 ml  ?Output --  ?Net 2792.37 ml  ? ? ? ?Physical Exam ? ?Middle-aged Caucasian male who appears ill and cachectic lying in hospital bed in no apparent distress, no focal deficits,  reduced left-sided breath sounds . ?[place].AT,PERRAL ?Supple Neck, No JVD,   ?Symmetrical Chest wall movement,  ?RRR,No Gallops, Rubs or new Murmurs,  ?+ve B.Sounds, Abd Soft, No tenderness,   ?No Cyanosis, Clubbing or edema  ? ? ? ? Data Review:  ? ? ?CBC ?Recent Labs  ?Lab 06/01/21 ?0128 06/01/21 ?1634 06/02/21 ?0422 06/02/21 ?7209 06/03/21 ?4709 06/04/21 ?0421 06/06/21 ?0541  ?WBC 15.7* 23.9*  --  17.5* 15.9* 14.8* 11.8*  ?HGB 9.2* 9.7* 8.2* 7.8* 9.4* 10.5* 9.7*  ?HCT 29.8* 30.7* 24.0* 25.9* 28.8* 32.3* 30.9*  ?PLT 788* 840*  --  701* 691* 675* 611*  ?MCV 83.2 83.0  --  84.6 83.5 82.2 85.1  ?MCH 25.7* 26.2  --  25.5* 27.2 26.7 26.7  ?MCHC 30.9 31.6  --  30.1 32.6 32.5 31.4  ?RDW 13.2 13.2  --  13.3 13.6 13.9 14.4  ?LYMPHSABS 1.7  --   --  1.3 1.6 2.0  --   ?MONOABS 1.5*  --   --  1.7* 1.6* 1.6*  --   ?EOSABS 0.3  --   --  0.0 0.2 0.2  --   ?BASOSABS 0.0  --   --  0.0 0.0 0.0  --   ? ? ?Electrolytes ?Recent Labs  ?Lab 05/31/21 ?6283 05/31/21 ?6629 05/31/21 ?1723 06/01/21 ?0128 06/02/21 ?0422  06/02/21 ?4765 06/03/21 ?4650 06/03/21 ?3546 06/04/21 ?0421 06/05/21 ?5681 06/06/21 ?0541  ?NA  --   --   --  133*   < > 135 137  --  137 139 135  ?K  --   --   --  4.6   < > 4.5 3.8  --  3.1* 3.8 4.3

## 2021-06-07 ENCOUNTER — Inpatient Hospital Stay (HOSPITAL_COMMUNITY): Payer: Medicaid Other

## 2021-06-07 LAB — COMPREHENSIVE METABOLIC PANEL
ALT: 29 U/L (ref 0–44)
AST: 24 U/L (ref 15–41)
Albumin: 2 g/dL — ABNORMAL LOW (ref 3.5–5.0)
Alkaline Phosphatase: 135 U/L — ABNORMAL HIGH (ref 38–126)
Anion gap: 8 (ref 5–15)
BUN: 21 mg/dL (ref 8–23)
CO2: 26 mmol/L (ref 22–32)
Calcium: 8.7 mg/dL — ABNORMAL LOW (ref 8.9–10.3)
Chloride: 100 mmol/L (ref 98–111)
Creatinine, Ser: 0.96 mg/dL (ref 0.61–1.24)
GFR, Estimated: >60 mL/min (ref >60)
Glucose, Bld: 117 mg/dL — ABNORMAL HIGH (ref 70–99)
Potassium: 4.6 mmol/L (ref 3.5–5.1)
Sodium: 134 mmol/L — ABNORMAL LOW (ref 135–145)
Total Bilirubin: 0.2 mg/dL — ABNORMAL LOW (ref 0.3–1.2)
Total Protein: 6.2 g/dL — ABNORMAL LOW (ref 6.5–8.1)

## 2021-06-07 LAB — MAGNESIUM: Magnesium: 1.8 mg/dL (ref 1.7–2.4)

## 2021-06-07 LAB — CBC
HCT: 33.8 % — ABNORMAL LOW (ref 39.0–52.0)
Hemoglobin: 10.7 g/dL — ABNORMAL LOW (ref 13.0–17.0)
MCH: 26.5 pg (ref 26.0–34.0)
MCHC: 31.7 g/dL (ref 30.0–36.0)
MCV: 83.7 fL (ref 80.0–100.0)
Platelets: 640 10*3/uL — ABNORMAL HIGH (ref 150–400)
RBC: 4.04 MIL/uL — ABNORMAL LOW (ref 4.22–5.81)
RDW: 14.4 % (ref 11.5–15.5)
WBC: 15.3 10*3/uL — ABNORMAL HIGH (ref 4.0–10.5)
nRBC: 0 % (ref 0.0–0.2)

## 2021-06-07 LAB — PROCALCITONIN: Procalcitonin: 0.23 ng/mL

## 2021-06-07 MED ORDER — NITROGLYCERIN 0.4 MG SL SUBL
SUBLINGUAL_TABLET | SUBLINGUAL | Status: AC
Start: 2021-06-07 — End: 2021-06-07
  Administered 2021-06-07: 0.4 mg
  Filled 2021-06-07: qty 1

## 2021-06-07 MED ORDER — MUSCLE RUB 10-15 % EX CREA
1.0000 "application " | TOPICAL_CREAM | CUTANEOUS | Status: DC | PRN
  Administered 2021-06-07: 1 via TOPICAL
  Filled 2021-06-07: qty 85

## 2021-06-07 NOTE — Progress Notes (Signed)
Occupational Therapy Treatment ?Patient Details ?Name: Lucas Burke ?MRN: [id number] ?DOB: Feb 01, 1961 ?Today's Date: 06/07/2021 ? ? ?History of present illness 61 y.o. male presented 05/22/21 with generalized weakness. Adm for sepsis due to pna; large left empyema; chest tube. CT removed 4/1.  PMH significant of chronic pain, panic attacks, anxiety, tremors ?  ?OT comments ? Patient seen to address dressing, grooming standing at sink, and toilet transfer. Patient was provided paper scrub top and bottom and was able to donn seated on EOB with supervision. Patient was min guard to ambulate to sink for grooming without RW and was supervision to perform standing at sink. Patient was min guard for safety to ambulate to bathroom and perform toilet transfer without use of hand rails. Patient states that he is worried most about his level of endurance when returning home. Patient instructed to place [facility] or shower chair in shower to assist with bathing and to perform ADL tasks seated when necessary.  Discharge recommendations continue for [place].   ? ?Recommendations for follow up therapy are one component of a multi-disciplinary discharge planning process, led by the attending physician.  Recommendations may be updated based on patient status, additional functional criteria and insurance authorization. ?   ?Follow Up Recommendations ? Home health OT  ?  ?Assistance Recommended at Discharge Intermittent Supervision/Assistance  ?Patient can return home with the following ? A little help with walking and/or transfers;A little help with bathing/dressing/bathroom ?  ?Equipment Recommendations ? BSC/3in1  ?  ?Recommendations for Other Services   ? ?  ?Precautions / Restrictions Precautions ?Precautions: Fall ?Precaution Comments: prev b/b incontinence ?Restrictions ?Weight Bearing Restrictions: No  ? ? ?  ? ?Mobility Bed Mobility ?Overal bed mobility: Needs Assistance ?Bed Mobility: Supine to Sit, Sit to Supine ?  ?  ?Supine to sit:  Supervision, HOB elevated ?Sit to supine: Supervision, HOB elevated ?  ?General bed mobility comments: increased time due to pain and use of bed rails ?  ? ?Transfers ?Overall transfer level: Needs assistance ?Equipment used: None ?Transfers: Sit to/from Stand, Bed to chair/wheelchair/BSC ?Sit to Stand: Supervision ?  ?  ?Step pivot transfers: Min guard ?  ?  ?General transfer comment: min guard for safety with mobility and toilet transfers ?  ?  ?Balance Overall balance assessment: Needs assistance ?Sitting-balance support: No upper extremity supported, Feet supported ?Sitting balance-Leahy Scale: Good ?  ?  ?Standing balance support: Single extremity supported, No upper extremity supported, During functional activity ?Standing balance-Leahy Scale: Fair ?Standing balance comment: no assistive devices used with min guard use of sink for balance ?  ?  ?  ?  ?  ?  ?  ?  ?  ?  ?  ?   ? ?ADL either performed or assessed with clinical judgement  ? ?ADL Overall ADL's : Needs assistance/impaired ?  ?  ?Grooming: Wash/dry hands;Wash/dry face;Oral care;Supervision/safety;Standing ?Grooming Details (indicate cue type and reason): able to stand at sink with no LOB ?  ?  ?  ?  ?Upper Body Dressing : Supervision/safety;Sitting ?Upper Body Dressing Details (indicate cue type and reason): donned pullover top ?Lower Body Dressing: Supervision/safety;Sitting/lateral leans ?Lower Body Dressing Details (indicate cue type and reason): able to thread legs into pants and stand up without assistance ?Toilet Transfer: Min guard;Stand-pivot;BSC/3in1 ?Toilet Transfer Details (indicate cue type and reason): transferred to regular toilet without use of hand rails ?  ?  ?  ?  ?  ?General ADL Comments: Patient seen to address dressing, grooming standing at  sink and toilet transfers with supervision to min guard assist to perform ?  ? ?Extremity/Trunk Assessment   ?  ?  ?  ?  ?  ? ?Vision   ?  ?  ?Perception   ?  ?Praxis   ?  ? ?Cognition  Arousal/Alertness: Awake/alert ?Behavior During Therapy: Flat affect ?Overall Cognitive Status: Within Functional Limits for tasks assessed ?  ?  ?  ?  ?  ?  ?  ?  ?  ?  ?  ?  ?  ?  ?  ?  ?General Comments: followed commands for safety ?  ?  ?   ?Exercises   ? ?  ?Shoulder Instructions   ? ? ?  ?General Comments SpO2 90-95% on RA pre/post-exertion with noisy pleth signal during mobility but only 1/4 DOE; 72 HR  ? ? ?Pertinent Vitals/ Pain       Pain Assessment ?Pain Assessment: 0-10 ?Pain Score: 5  ?Pain Location: low back pain and from previous chest tube site ?Pain Descriptors / Indicators: Grimacing, Aching, Discomfort ?Pain Intervention(s): Limited activity within patient's tolerance, Monitored during session, Patient requesting pain meds-RN notified ? ?Home Living   ?  ?  ?  ?  ?  ?  ?  ?  ?  ?  ?  ?  ?  ?  ?  ?  ?  ?  ? ?  ?Prior Functioning/Environment    ?  ?  ?  ?   ? ?Frequency ? Min 2X/week  ? ? ? ? ?  ?Progress Toward Goals ? ?OT Goals(current goals can now be found in the care plan section) ? Progress towards OT goals: Progressing toward goals ? ?Acute Rehab OT Goals ?Patient Stated Goal: go home ?OT Goal Formulation: With patient ?Time For Goal Achievement: 06/16/21 ?Potential to Achieve Goals: Fair ?ADL Goals ?Pt Will Perform Grooming: with modified independence;standing ?Pt Will Perform Lower Body Bathing: with modified independence;sit to/from stand ?Pt Will Perform Lower Body Dressing: with modified independence;sit to/from stand ?Pt Will Transfer to Toilet: with modified independence;ambulating ?Pt Will Perform Toileting - Clothing Manipulation and hygiene: with modified independence;sit to/from stand ?Additional ADL Goal #1: Pt will walk to bathroom and complete all toileting with mod I.  ?Plan Discharge plan remains appropriate;Frequency remains appropriate   ? ?Co-evaluation ? ? ?   ?  ?  ?  ?  ? ?  ?AM-PAC OT "6 Clicks" Daily Activity     ?Outcome Measure ? ? Help from another person eating  meals?: None ?Help from another person taking care of personal grooming?: None ?Help from another person toileting, which includes using toliet, bedpan, or urinal?: A Little ?Help from another person bathing (including washing, rinsing, drying)?: A Little ?Help from another person to put on and taking off regular upper body clothing?: A Little ?Help from another person to put on and taking off regular lower body clothing?: A Little ?6 Click Score: 20 ? ?  ?End of Session   ? ?OT Visit Diagnosis: Unsteadiness on feet (R26.81);Muscle weakness (generalized) (M62.81) ?  ?Activity Tolerance Patient tolerated treatment well ?  ?Patient Left in bed;with call bell/phone within reach ?  ?Nurse Communication Mobility status;Patient requests pain meds ?  ? ?   ? ?Time: 3500-9381 ?OT Time Calculation (min): 17 min ? ?Charges: OT General Charges ?$OT Visit: 1 Visit ?OT Treatments ?$Self Care/Home Management : 8-22 mins ? ?Lodema Hong, OTA ?Acute Rehabilitation Services  ?Pager [phone] ?Office [phone] ? ? ?Kolsen Choe  Alexis Goodell ?06/07/2021, 2:32 PM ?

## 2021-06-07 NOTE — Progress Notes (Signed)
Physical Therapy Treatment ?Patient Details ?Name: Lucas Burke ?MRN: [id number] ?DOB: 1960/03/10 ?Today's Date: 06/07/2021 ? ? ?History of Present Illness 61 y.o. male presented 05/22/21 with generalized weakness. Adm for sepsis due to pna; large left empyema; chest tube. CT removed 4/1.  PMH significant of chronic pain, panic attacks, anxiety, tremors ? ?  ?PT Comments  ? ? Patient referred by Oval Linsey, PTA for re-evaluation related to discharge plan. Requested OT also see pt to determine level of assist needed for ADLs in addition to functional mobility (ambulation and stairs). After OT and PT session, determined pt continues to be appropriate for discharge with [facility] therapies. Patient agrees he feels he can go home--his biggest concern is what home he will go to. He reports that he does not have an aunt so he's not sure why it was documented that he might stay with his aunt. Daughter apparently may have found a place for patient to live (she has been working on this for him).  ?   ?Recommendations for follow up therapy are one component of a multi-disciplinary discharge planning process, led by the attending physician.  Recommendations may be updated based on patient status, additional functional criteria and insurance authorization. ? ?Follow Up Recommendations ? Home health PT ?  ?  ?Assistance Recommended at Discharge Set up Supervision/Assistance  ?Patient can return home with the following Assistance with cooking/housework;Help with stairs or ramp for entrance;A little help with bathing/dressing/bathroom ?  ?Equipment Recommendations ? None recommended by PT  ?  ?Recommendations for Other Services Other (comment) (Social work) ? ? ?  ?Precautions / Restrictions Precautions ?Precautions: Fall ?Precaution Comments: prev b/b incontinence ?Restrictions ?Weight Bearing Restrictions: No ?Other Position/Activity Restrictions: mulitple lines. Pt currently receiving blood  ?  ? ?Mobility ? Bed Mobility ?Overal bed  mobility: Needs Assistance ?Bed Mobility: Supine to Sit, Sit to Supine ?  ?  ?Supine to sit: HOB elevated, Modified independent (Device/Increase time) ?Sit to supine: Modified independent (Device/Increase time) ?  ?General bed mobility comments: increased time due to pain ; no use of rails ?  ? ?Transfers ?Overall transfer level: Needs assistance ?Equipment used: None ?Transfers: Sit to/from Stand, Bed to chair/wheelchair/BSC ?Sit to Stand: Supervision ?  ?  ?  ?  ?  ?General transfer comment: supervision for safety, but pt without imbalance ?  ? ?Ambulation/Gait ?Ambulation/Gait assistance: Supervision ?Gait Distance (Feet): 80 Feet ?Assistive device: None ?Gait Pattern/deviations: Step-through pattern, Decreased stride length, Trunk flexed ?Gait velocity: reduced ?  ?  ?General Gait Details: no device used as pt did well with OT doing ADLs in room without RW; pt with slower, slightly cautious gait but with no imbalance ? ? ?Stairs ?  ?Stairs assistance: Min guard ?Stair Management: Forwards, Step to pattern, One rail Right ?Number of Stairs: 5 (1 step x 5 reps) ?General stair comments: pt stepped up and down going forwards over wooden step while holding onto elevated bed rail to simulate stair rail; no imbalance x 5 reps ? ? ?Wheelchair Mobility ?  ? ?Modified Rankin (Stroke Patients Only) ?  ? ? ?  ?Balance Overall balance assessment: Needs assistance ?Sitting-balance support: No upper extremity supported, Feet supported ?Sitting balance-Leahy Scale: Good ?  ?  ?Standing balance support: During functional activity ?Standing balance-Leahy Scale: Fair ?  ?  ?  ?  ?  ?  ?  ?  ?  ?  ?  ?  ?  ? ?  ?Cognition Arousal/Alertness: Awake/alert ?Behavior During Therapy: Flat affect ?  Overall Cognitive Status: Within Functional Limits for tasks assessed ?  ?  ?  ?  ?  ?  ?  ?  ?  ?  ?  ?  ?  ?  ?  ?  ?General Comments: followed commands for safety ?  ?  ? ?  ?Exercises   ? ?  ?General Comments General comments (skin  integrity, edema, etc.): SpO2 90-95% on RA pre/post-exertion with noisy pleth signal during mobility but only 1/4 DOE; 72 HR ?  ?  ? ?Pertinent Vitals/Pain Pain Assessment ?Pain Assessment: 0-10 ?Pain Score: 6  ?Pain Location: low back pain and from previous chest tube site ?Pain Descriptors / Indicators: Grimacing, Aching, Discomfort ?Pain Intervention(s): Limited activity within patient's tolerance, Monitored during session  ? ? ?Home Living   ?  ?  ?  ?  ?  ?  ?  ?  ?  ?   ?  ?Prior Function    ?  ?  ?   ? ?PT Goals (current goals can now be found in the care plan section) Acute Rehab PT Goals ?Patient Stated Goal: to find a place to go home to when discharged ?PT Goal Formulation: With patient ?Time For Goal Achievement: 06/14/21 ?Potential to Achieve Goals: Good ?Progress towards PT goals: Progressing toward goals ? ?  ?Frequency ? ? ? Min 3X/week ? ? ? ?  ?PT Plan Current plan remains appropriate  ? ? ?Co-evaluation   ?  ?  ?  ?  ? ?  ?AM-PAC PT "6 Clicks" Mobility   ?Outcome Measure ? Help needed turning from your back to your side while in a flat bed without using bedrails?: None ?Help needed moving from lying on your back to sitting on the side of a flat bed without using bedrails?: None ?Help needed moving to and from a bed to a chair (including a wheelchair)?: A Little ?Help needed standing up from a chair using your arms (e.g., wheelchair or bedside chair)?: A Little ?Help needed to walk in hospital room?: A Little ?Help needed climbing 3-5 steps with a railing? : A Little ?6 Click Score: 20 ? ?  ?End of Session Equipment Utilized During Treatment: Gait belt ?Activity Tolerance: Patient tolerated treatment well ?Patient left: in bed;with call bell/phone within reach;with bed alarm set (pt preferred return to bed due to discomfort in chair (low back)) ?Nurse Communication: Mobility status ?PT Visit Diagnosis: Difficulty in walking, not elsewhere classified (R26.2);Unsteadiness on feet (R26.81) ?   ? ? ?Time: 4081-4481 ?PT Time Calculation (min) (ACUTE ONLY): 20 min ? ?Charges:  $Gait Training: 8-22 mins ?$Therapeutic Exercise: 8-22 mins          ?          ? ? ?Arby Barrette, PT ?Acute Rehabilitation Services  ?Pager [phone] ?Office [phone] ? ? ? ?Jeanie Cooks Trent Gabler ?06/07/2021, 2:51 PM ? ?

## 2021-06-07 NOTE — Progress Notes (Signed)
6 Days Post-Op Procedure(s) (LRB): ?VIDEO ASSISTED THORACOSCOPY (VATS)/EMPYEMA - APPLICATION OF ON-Q PAIN PUMP (Left) ?DECORTICATION (Left) ?VIDEO BRONCHOSCOPY (N/A) ?PLEURAL BIOPSY (Left) ?Subjective: ?Breathing well on room air ?CXR stable ?Incision clean, dry ? ?Objective: ?Vital signs in last 24 hours: ?Temp:  [98.5 ?F (36.9 ?C)-99.9 ?F (37.7 ?C)] 98.6 ?F (37 ?C) (04/03 0744) ?Pulse Rate:  [68-87] 82 (04/03 0744) ?Cardiac Rhythm: Normal sinus rhythm (04/03 0714) ?Resp:  [17-20] 17 (04/03 0744) ?BP: (92-106)/(54-66) 99/63 (04/03 0744) ?SpO2:  [93 %-95 %] 95 % (04/03 0744) ? ?Hemodynamic parameters for last 24 hours: ? nsr ? ?Intake/Output from previous day: ?04/02 0701 - 04/03 0700 ?In: 480 [P.O.:480] ?Out: 600 [Urine:600] ?Intake/Output this shift: ?Total I/O ?In: 240 [P.O.:240] ?Out: -  ? ?EXAM ?R lung clear ?Chest tube sie, VATS incisions ok ? ?Lab Results: ?Recent Labs  ?  06/06/21 ?6389 06/07/21 ?0127  ?WBC 11.8* 15.3*  ?HGB 9.7* 10.7*  ?HCT 30.9* 33.8*  ?PLT 611* 640*  ? ?BMET:  ?Recent Labs  ?  06/06/21 ?3734 06/07/21 ?0127  ?NA 135 134*  ?K 4.3 4.6  ?CL 94* 100  ?CO2 29 26  ?GLUCOSE 100* 117*  ?BUN 21 21  ?CREATININE 1.13 0.96  ?CALCIUM 8.9 8.7*  ?  ?PT/INR: No results for input(s): LABPROT, INR in the last 72 hours. ?ABG ?   ?Component Value Date/Time  ? PHART 7.433 06/02/2021 0422  ? HCO3 29.0 (H) 06/02/2021 0422  ? TCO2 30 06/02/2021 0422  ? O2SAT 92 06/02/2021 0422  ? ?CBG (last 3)  ?No results for input(s): GLUCAP in the last 72 hours. ? ?Assessment/Plan: ?S/P Procedure(s) (LRB): ?VIDEO ASSISTED THORACOSCOPY (VATS)/EMPYEMA - APPLICATION OF ON-Q PAIN PUMP (Left) ?DECORTICATION (Left) ?VIDEO BRONCHOSCOPY (N/A) ?PLEURAL BIOPSY (Left) ?Will followup in office next  week- leave chest tube suture until then ? ? LOS: 8 days  ? ? ?Dahlia Byes ?06/07/2021 ?  ?

## 2021-06-07 NOTE — Progress Notes (Signed)
?                                  PROGRESS NOTE                                             ?                                                                                                                     ?                                         ? ? Patient Demographics:  ? ? Lucas Burke, is a 61 y.o. male, DOB - 08-Aug-1960, FTD:[id number] ? ?Outpatient Primary MD for the patient is [facility], [place]    LOS - 8  Admit date - 05/30/2021   ? ?No chief complaint on file. ?    ? ?Brief Narrative (HPI from H&P) history 61 year old Caucasian male with history of chronic pain, panic attacks, anxiety, tremors who presented to [facility] hospital on 05/22/2021 with generalized weakness, his work-up was suggestive of left-sided empyema requiring chest tube placement, he was seen by pulmonary critical care and IR at [facility] and then transferred to [facility] on 05/30/2021 for further evaluation by cardiothoracic surgery and pulmonary critical team here.  Of note his CT scan done at [facility] is highly suspicious for left bronchus obstruction and underlying malignancy.  He is extremely weak and cachectic. ? ? Subjective:  ? ?Patient in bed, appears comfortable, denies any headache, no fever, no chest pain or pressure, no shortness of breath , no abdominal pain. No focal weakness. ? ? Assessment  & Plan :  ? ? 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  He was seen here by pulmonary and cardiothoracic  surgery, s/p VATS procedure on 06/01/2021.  All blood cultures and pleural fluid cultures have been negative thus far, his plan was discussed by me with ID physician Dr. Jule Ser and cardiothoracic surgeon Dr. Kipp Brood on 06/06/2021.  There is no sign of infection hence Unasyn was stopped on 06/08/2022 no prolonged antibiotics needed.  Chest tube removed on 06/05/2021 currently stable. ? ?2.  New diagnosis of stage IV squamous cell cancer.  Being followed by oncologist here, Port-A-Cath  to be placed per oncology on 06/07/2021, of note due to severe claustrophobia patient refused MRI brain and whole-body bone scan, CT scan with without contrast of the brain does not show any metastasis to the brain.  Most likely discharge in the next 1 to 2 days with outpatient follow-up with oncology for outpatient treatment. ? ?3. Severe protein calorie malnutrition and cachexia.  Placed on protein supplements. ?  ?4.  Severe hypercalcemia with appropriately suppressed PTH.  Stable ACE levels, stable vitamin D 25 and calcitriol PTH RP levels, has been aggressively hydrated and given 3 doses of IV Salmon calcitonin along with Zometa on 06/04/2021.  Calcium levels have improved. ? ?5.  Weakness and deconditioning.  PT OT at home.  Likely discharge on 06/09/2022  ? ?6.  Iron deficiency with perioperative acute blood loss.  On iron supplements orally, 1 unit of packed RBC given 06/02/2021 in the OR. ? ?7.  Chronic neck back pain.  Supportive care. ? ?8.  Suppressed PTH in response to hypercalcemia likely due to underlying malignancy.  Treat hypercalcemia. ? ?   ? ?Condition - Extremely Guarded ? ?Family Communication  : daughter [name] [phone] - 06/03/21 ? ?Code Status : Full ? ?Consults  : Pulmonary critical care, cardiothoracic surgery ? ?PUD Prophylaxis : PPI ? ? Procedures  :    ? ?CT Brain -  ? ?Whole body bone scan -  ? ?VATS procedure 06/01/2021 by cardiothoracic surgery  ? ?CT chest done at [facility] 05/27/2021.  Large loculated  left-sided pleural effusion, possible obstruction of the left bronchus with mediastinal adenopathy on the left side.   ? ?05/28/2021.  CT-guided left chest tube placement by IR at [facility]. ? ?Thyroid ultrasound.  Unremarkable. ? ?   ? ?Disposition Plan  :   ? ?Status is: Inpatient ? ?DVT Prophylaxis  :   ? ?enoxaparin (LOVENOX) injection 40 mg Start: 06/03/21 1000 ?SCD's Start: 06/01/21 1557 ?  ? ?Lab Results  ?Component Value Date  ? PLT 640 (H) 06/07/2021  ? ? ?Diet :  ?Diet Order   ? ?       ?  Diet regular Room service appropriate? Yes; Fluid consistency: Thin  Diet effective now       ?  ? ?  ?  ? ?  ?  ? ?Inpatient Medications ? ?Scheduled Meds: ? (feeding supplement) PROSource Plus  30 mL Oral BID BM  ? bisacodyl  10 mg Oral Daily  ? enoxaparin (LOVENOX) injection  40 mg Subcutaneous Daily  ? feeding supplement  237 mL Oral TID BM  ? gabapentin  300 mg Oral BID  ? midodrine  5 mg Oral TID WC  ? multivitamin with minerals  1 tablet Oral Daily  ? pantoprazole  40 mg Oral Daily  ? PARoxetine  20 mg Oral Daily  ? senna-docusate  1 tablet Oral QHS  ? sodium chloride flush  3 mL Intravenous Q12H  ? ?Continuous Infusions: ? bupivacaine 0.5 % ON-Q pump SINGLE CATH 400 mL    ? ?PRN Meds:.acetaminophen **OR** acetaminophen, ALPRAZolam, alum & mag hydroxide-simeth, Muscle Rub, ondansetron (ZOFRAN) IV, ondansetron, oxyCODONE, polyethylene glycol, traMADol ? ?Antibiotics  :   ? ?Anti-infectives (From admission, onward)  ? ? Start     Dose/Rate Route Frequency Ordered Stop  ? 05/31/21 1651  ceFAZolin (ANCEF) IVPB 2g/100 mL premix       ? 2 g ?200 mL/hr over 30 Minutes Intravenous 30 min pre-op 05/31/21 1651 06/01/21 0842  ? 05/30/21  2200  Ampicillin-Sulbactam (UNASYN) 3 g in sodium chloride 0.9 % 100 mL IVPB  Status:  Discontinued       ? 3 g ?200 mL/hr over 30 Minutes Intravenous Every 6 hours 05/30/21 2044 06/06/21 1029  ? ?  ? ? ? Time Spent in minutes  30 ? ? ?Lala Lund M.D on 06/07/2021 at 8:49 AM ? ?To  page go to [email or web address]  ? ?[facility] -  Office  [phone] ? ?See all Orders from today for further details ? ? ? Objective:  ? ?Vitals:  ? 06/06/21 1943 06/06/21 2317 06/07/21 2878 06/07/21 0744  ?BP: 106/63 101/66 (!) 92/54 99/63  ?Pulse: 68 87 80 82  ?Resp: 19 20 20 17   ?Temp: 98.6 ?F (37 ?C) 99.9 ?F (37.7 ?C) 98.5 ?F (36.9 ?C) 98.6 ?F (37 ?C)  ?TempSrc: Oral Oral Oral Oral  ?SpO2: 94% 93% 95% 95%  ?Weight:      ?Height:      ? ? ?Wt Readings from Last 3 Encounters:  ?05/31/21 76 kg  ?05/22/21 71.4 kg  ?05/22/19 79.4 kg  ? ? ? ?Intake/Output Summary (Last 24 hours) at 06/07/2021 0849 ?Last data filed at 06/07/2021 0800 ?Gross per 24 hour  ?Intake 720 ml  ?Output 600 ml  ?Net 120 ml  ? ? ? ?Physical Exam ? ?Middle-aged Caucasian male who appears ill and cachectic lying in hospital bed in no apparent distress, no focal deficits,  reduced left-sided breath sounds . ?[place].AT,PERRAL ?Supple Neck, No JVD,   ?Symmetrical Chest wall movement, Good air movement bilaterally, CTAB ?RRR,No Gallops, Rubs or new Murmurs,  ?+ve B.Sounds, Abd Soft, No tenderness,   ?No Cyanosis, Clubbing or edema  ? ? Data Review:  ? ? ?CBC ?Recent Labs  ?Lab 06/01/21 ?0128 06/01/21 ?1634 06/02/21 ?0436 06/03/21 ?0325 06/04/21 ?0421 06/06/21 ?6767 06/07/21 ?0127  ?WBC 15.7*   < > 17.5* 15.9* 14.8* 11.8* 15.3*  ?HGB 9.2*   < > 7.8* 9.4* 10.5* 9.7* 10.7*  ?HCT 29.8*   < > 25.9* 28.8* 32.3* 30.9* 33.8*  ?PLT 788*   < > 701* 691* 675* 611* 640*  ?MCV 83.2   < > 84.6 83.5 82.2 85.1 83.7  ?MCH 25.7*   < > 25.5* 27.2 26.7 26.7 26.5  ?MCHC 30.9   < > 30.1 32.6 32.5 31.4 31.7  ?RDW 13.2   < > 13.3 13.6 13.9 14.4 14.4  ?LYMPHSABS 1.7  --  1.3 1.6 2.0  --   --   ?MONOABS 1.5*  --  1.7* 1.6* 1.6*  --   --   ?EOSABS 0.3  --  0.0 0.2 0.2  --   --   ?BASOSABS 0.0  --  0.0 0.0 0.0  --   --   ? < > = values in this interval not displayed.  ? ? ?Electrolytes ?Recent Labs  ?Lab  ?0000 05/31/21 ?1723 06/01/21 ?0128 06/02/21 ?0422 06/02/21 ?2094  06/03/21 ?7096 06/03/21 ?2836 06/04/21 ?0421 06/05/21 ?6294 06/06/21 ?7654 06/07/21 ?0127  ?NA  --   --  133*   < > 135 137  --  137 139 135 134*  ?K  --   --  4.6   < > 4.5 3.8  --  3.1* 3.8 4.3 4.6  ?CL   < >  --  98

## 2021-06-07 NOTE — Progress Notes (Signed)
Lucas Burke had the chest tube taken out.  He feels much better.  He actually looks better.  The central line was taken out.  I think we really need to try to get the Port-A-Cath in him while he is in the hospital.  This will be a whole lot easier for him. ? ?I am not sure why it is taking so long for the bone scan to be read.  This was done 5 days ago. ? ?He is eating better.  His albumin is coming up.  This is a good sign. ? ?He says he is going to the bathroom.  He is not having any increased shortness of breath.  He is not having any problems with pain.  There is no bleeding. ? ?His labs show a calcium 8.7 with an albumin of 2.0.  His BUN is 21 creatinine 0.96.  His white cell count is 15.3.  Hemoglobin 10.7.  Platelet count 640,000. ? ?There is been no rashes.  He has had no mouth sores.  There is no headache. ? ?He did have a CT of the brain on Saturday.  This was negative for any obvious metastatic disease. ? ?Hopefully, he will be able to go home this week.  I think the problem now is he has no home periods where he was living with 3 children, he has been moved out of.  Again, he is not sure where he will be able to live. ? ?We did send off the molecular markers.  I would think that there would not be a molecular mutation that we can take advantage of.  However, this is always a possibility. ? ?Again, while he is in the hospital, we really should try to get a Port-A-Cath put into him.  I this would make life a whole lot easier for him since he will not have to return for this. ? ?Obviously, he is getting incredible care from all the staff up on [facility].  They are doing a wonderful job with him.  He did is in a very difficult situation but every time I see him, he does seem to be getting better. ? ?Lucas Haw, MD ? ?Lucas Burke 11:9 ?

## 2021-06-07 NOTE — Progress Notes (Addendum)
Physical Therapy Treatment ?Patient Details ?Name: Lucas Burke ?MRN: [id number] ?DOB: [birth year] ?Today's Date: 06/07/2021 ? ? ?History of Present Illness 61 y.o. male presented 05/22/21 with generalized weakness. Adm for sepsis due to pna; large left empyema; chest tube. CT removed 4/1.  PMH significant of chronic pain, panic attacks, anxiety, tremors ? ?  ?PT Comments  ? ? Pt received in supine, A&O x 4 and agreeable to therapy session, with good participation and tolerance for gait and transfer training. Pt performed seated LE exercises with mod cues for technique/reps and needing up to minA for gait without AD and step-ups onto 7" platform with BUE support. Pt continues to benefit from PT services to progress toward functional mobility goals. Disposition discussed with pt and supervising PT Arby Barrette, pt interested in post-acute therapies but will need PT re-eval to further assess, continue to recommend DC home with HHPT pending progress next 1-2 sessions.  ?Recommendations for follow up therapy are one component of a multi-disciplinary discharge planning process, led by the attending physician.  Recommendations may be updated based on patient status, additional functional criteria and insurance authorization. ? ?Follow Up Recommendations ? Home health PT ?  ?  ?Assistance Recommended at Discharge Set up Supervision/Assistance  ?Patient can return home with the following Assistance with cooking/housework;Help with stairs or ramp for entrance;A little help with bathing/dressing/bathroom;Assist for transportation;A little help with walking and/or transfers ?  ?Equipment Recommendations ? Rolling walker (2 wheels)  ?  ?Recommendations for Other Services   ? ? ?  ?Precautions / Restrictions Precautions ?Precautions: Fall ?Precaution Comments: prev b/b incontinence ?Restrictions ?Weight Bearing Restrictions: No  ?  ? ?Mobility ? Bed Mobility ?Overal bed mobility: Needs Assistance ?Bed Mobility: Supine to Sit, Sit to  Supine ?  ?  ?Supine to sit: Supervision, HOB elevated ?Sit to supine: Min guard, HOB elevated ?  ?General bed mobility comments: increased time, use of bed features, cues for sequencing ?  ? ?Transfers ?Overall transfer level: Needs assistance ?Equipment used: None ?Transfers: Sit to/from Stand, Bed to chair/wheelchair/BSC ?Sit to Stand: Min guard ?  ?  ?  ?  ?  ?General transfer comment: from EOB, cues for UE placement, min guard for safety, mildly unsteady without RW support ?  ? ?Ambulation/Gait ?Ambulation/Gait assistance: Min assist, Min guard, +2 safety/equipment ?Gait Distance (Feet): 30 Feet (30ft with RW, seated break, 68ft, seated break, 41ft) ?Assistive device: Rolling walker (2 wheels), 1 person hand held assist ?Gait Pattern/deviations: Step-through pattern, Decreased stride length, Decreased dorsiflexion - right, Decreased dorsiflexion - left, Trunk flexed, Drifts right/left ?  ?  ?  ?General Gait Details: RUE tremor increased with fatigue; with RW pt needing min guard/cues for proximity to device. With HHA, pt needing frequent minA UE support and with shorter step length/increased gait deviations ? ? ?Stairs ?Stairs: Yes ?Stairs assistance: Min assist ?Stair Management: With walker, Forwards, Step to pattern ?Number of Stairs: 5 ?General stair comments: cues for step sequencing; 1 step x5 reps with 7" platform in the room ? ? ?Wheelchair Mobility ?  ? ?Modified Rankin (Stroke Patients Only) ?  ? ? ?  ?Balance Overall balance assessment: Needs assistance ?Sitting-balance support: No upper extremity supported, Feet supported ?Sitting balance-Leahy Scale: Good ?  ?  ?Standing balance support: Bilateral upper extremity supported, During functional activity, Reliant on assistive device for balance ?Standing balance-Leahy Scale: Fair ?Standing balance comment: fair using RW; poor using no AD ?  ?  ? ?  ?Cognition Arousal/Alertness: Awake/alert ?Behavior During  Therapy: Flat affect ?Overall Cognitive  Status: Within Functional Limits for tasks assessed ?  ?  ?  ?  ?  ?General Comments: Pt appears appropriate, some slow processing; mild BUE tremors ?  ?  ? ?  ?Exercises General Exercises - Lower Extremity ?Ankle Circles/Pumps: AROM, Both, Supine, 10 reps ?Long Arc Quad: AROM, Seated, Strengthening, Both, 10 reps ?Hip Flexion/Marching: AROM, Strengthening, Both, 10 reps, Seated ?Other Exercises ?Other Exercises: step-ups x 5 reps for BLE strengthening 7" platform ? ?  ?General Comments General comments (skin integrity, edema, etc.): SpO2 90-95% on RA pre/post-exertion with noisy pleth signal during mobility but only 1/4 DOE; 72 HR ?  ?  ? ?Pertinent Vitals/Pain Pain Assessment ?Pain Assessment: 0-10 ?Pain Score: 5  ?Pain Location: low back pain and from previous chest tube site ?Pain Descriptors / Indicators: Grimacing, Aching, Discomfort ?Pain Intervention(s): Limited activity within patient's tolerance, Monitored during session, Repositioned  ? ? ? ?PT Goals (current goals can now be found in the care plan section) Acute Rehab PT Goals ?Patient Stated Goal: to go to rehab to get stronger prior to going home ?PT Goal Formulation: With patient ?Time For Goal Achievement: 06/14/21 ?Progress towards PT goals: Progressing toward goals ? ?  ?Frequency ? ? ? Min 3X/week ? ? ? ?  ?PT Plan Discharge plan needs to be updated  ? ? ?   ?AM-PAC PT "6 Clicks" Mobility   ?Outcome Measure ? Help needed turning from your back to your side while in a flat bed without using bedrails?: A Little ?Help needed moving from lying on your back to sitting on the side of a flat bed without using bedrails?: A Little ?Help needed moving to and from a bed to a chair (including a wheelchair)?: A Little ?Help needed standing up from a chair using your arms (e.g., wheelchair or bedside chair)?: A Little ?Help needed to walk in hospital room?: A Lot (mod cues) ?Help needed climbing 3-5 steps with a railing? : A Lot (mod cues) ?6 Click Score:  16 ? ?  ?End of Session Equipment Utilized During Treatment: Gait belt ?Activity Tolerance: Patient tolerated treatment well;Patient limited by fatigue ?Patient left: in bed;with call bell/phone within reach;with bed alarm set ?Nurse Communication: Mobility status;Other (comment) (pt c/o abdominal discomfort and LBP) ?PT Visit Diagnosis: Difficulty in walking, not elsewhere classified (R26.2);Unsteadiness on feet (R26.81) ?  ? ? ?Time: 6712-4580 ?PT Time Calculation (min) (ACUTE ONLY): 30 min ? ?Charges:  $Gait Training: 8-22 mins ?$Therapeutic Exercise: 8-22 mins          ?          ? ?Sueo Cullen P., PTA ?Acute Rehabilitation Services ?Secure Chat Preferred 9a-5:30pm ?Office: [phone]  ? ? ?Kara Pacer Ulah Olmo ?06/07/2021, 11:42 AM ? ?

## 2021-06-08 ENCOUNTER — Other Ambulatory Visit: Payer: Self-pay | Admitting: Cardiothoracic Surgery

## 2021-06-08 ENCOUNTER — Other Ambulatory Visit (HOSPITAL_COMMUNITY): Payer: Self-pay

## 2021-06-08 DIAGNOSIS — J869 Pyothorax without fistula: Secondary | ICD-10-CM

## 2021-06-08 LAB — COMPREHENSIVE METABOLIC PANEL
ALT: 29 U/L (ref 0–44)
AST: 19 U/L (ref 15–41)
Albumin: 2 g/dL — ABNORMAL LOW (ref 3.5–5.0)
Alkaline Phosphatase: 119 U/L (ref 38–126)
Anion gap: 7 (ref 5–15)
BUN: 20 mg/dL (ref 8–23)
CO2: 26 mmol/L (ref 22–32)
Calcium: 8.6 mg/dL — ABNORMAL LOW (ref 8.9–10.3)
Chloride: 103 mmol/L (ref 98–111)
Creatinine, Ser: 0.96 mg/dL (ref 0.61–1.24)
GFR, Estimated: >60 mL/min (ref >60)
Glucose, Bld: 110 mg/dL — ABNORMAL HIGH (ref 70–99)
Potassium: 4.8 mmol/L (ref 3.5–5.1)
Sodium: 136 mmol/L (ref 135–145)
Total Bilirubin: 0.3 mg/dL (ref 0.3–1.2)
Total Protein: 5.9 g/dL — ABNORMAL LOW (ref 6.5–8.1)

## 2021-06-08 LAB — CBC
HCT: 32.2 % — ABNORMAL LOW (ref 39.0–52.0)
Hemoglobin: 10.2 g/dL — ABNORMAL LOW (ref 13.0–17.0)
MCH: 26.8 pg (ref 26.0–34.0)
MCHC: 31.7 g/dL (ref 30.0–36.0)
MCV: 84.7 fL (ref 80.0–100.0)
Platelets: 534 10*3/uL — ABNORMAL HIGH (ref 150–400)
RBC: 3.8 MIL/uL — ABNORMAL LOW (ref 4.22–5.81)
RDW: 14.5 % (ref 11.5–15.5)
WBC: 14.1 10*3/uL — ABNORMAL HIGH (ref 4.0–10.5)
nRBC: 0 % (ref 0.0–0.2)

## 2021-06-08 LAB — PROCALCITONIN: Procalcitonin: 0.19 ng/mL

## 2021-06-08 LAB — MAGNESIUM: Magnesium: 1.8 mg/dL (ref 1.7–2.4)

## 2021-06-08 MED ORDER — MIDODRINE HCL 5 MG PO TABS
5.0000 mg | ORAL_TABLET | Freq: Three times a day (TID) | ORAL | 0 refills | Status: DC
  Filled 2021-06-08: qty 90, 30d supply, fill #0

## 2021-06-08 MED ORDER — PANTOPRAZOLE SODIUM 40 MG PO TBEC
40.0000 mg | DELAYED_RELEASE_TABLET | Freq: Every day | ORAL | 0 refills | Status: DC
  Filled 2021-06-08: qty 30, 30d supply, fill #0

## 2021-06-08 MED ORDER — OXYCODONE HCL 5 MG PO TABS: 5.0000 mg | ORAL_TABLET | Freq: Four times a day (QID) | ORAL | Status: DC | PRN

## 2021-06-08 MED ORDER — OXYCODONE HCL 5 MG PO TABS
5.0000 mg | ORAL_TABLET | Freq: Four times a day (QID) | ORAL | 0 refills | Status: DC | PRN
  Filled 2021-06-08: qty 10, 3d supply, fill #0

## 2021-06-08 MED ORDER — TRAMADOL HCL 50 MG PO TABS: 100.0000 mg | ORAL_TABLET | Freq: Four times a day (QID) | ORAL | Status: DC | PRN

## 2021-06-08 NOTE — TOC Transition Note (Signed)
Transition of Care (TOC) - CM/SW Discharge Note ? ? ?Patient Details  ?Name: Lucas Burke ?MRN: [id number] ?Date of Birth: [birth year] ? ?Transition of Care (TOC) CM/SW Contact:  ?Angelita Ingles, RN ?Phone Number:[phone] ? ?06/08/2021, 10:16 AM ? ? ?Clinical Narrative:    ?Patient discharging home. HH previously set up active with [facility] per documentation. CM has notified [facility] to make them aware of d/c. Info added to avs. Rolling walker orders per [employer] and will be delivered to the room.  ? ? ?  ?  ? ? ?Patient Goals and CMS Choice ?  ?  ?  ? ?Discharge Placement ?  ?           ?  ?  ?  ?  ? ?Discharge Plan and Services ?  ?  ?           ?  ?  ?  ?  ?  ?  ?  ?  ?  ?  ? ?Social Determinants of Health (SDOH) Interventions ?  ? ? ?Readmission Risk Interventions ? ?  05/23/2021  ?  3:37 PM  ?Readmission Risk Prevention Plan  ?Post Dischage Appt Complete  ?Medication Screening Complete  ?Transportation Screening Complete  ? ? ? ? ? ?

## 2021-06-08 NOTE — Discharge Instructions (Signed)
Follow with [facility] [place] in 7 days  ? ?Get CBC, CMP, 2 view Chest X ray -  checked next visit within 1 week by Primary MD  ? ?Activity: As tolerated with Full fall precautions use walker/cane & assistance as needed ? ?Disposition Home   ? ?Diet: Heart Healthy   ? ?Special Instructions: If you have smoked or chewed Tobacco  in the last 2 yrs please stop smoking, stop any regular Alcohol  and or any Recreational drug use. ? ?On your next visit with your primary care physician please Get Medicines reviewed and adjusted. ? ?Please request your Prim.MD to go over all Hospital Tests and Procedure/Radiological results at the follow up, please get all Hospital records sent to your Prim MD by signing hospital release before you go home. ? ?If you experience worsening of your admission symptoms, develop shortness of breath, life threatening emergency, suicidal or homicidal thoughts you must seek medical attention immediately by calling 911 or calling your MD immediately  if symptoms less severe. ? ?You Must read complete instructions/literature along with all the possible adverse reactions/side effects for all the Medicines you take and that have been prescribed to you. Take any new Medicines after you have completely understood and accpet all the possible adverse reactions/side effects.  ? ?  ? ?

## 2021-06-08 NOTE — Discharge Summary (Signed)
?                                                                                ? ?Lucas Burke:[id number] DOB: [birth year] DOA: 05/30/2021 ? ?[facility]: [facility] ? ?Admit date: 05/30/2021  Discharge date: 06/08/2021 ? ?Admitted From: Home   Disposition:  Home ? ? ?Recommendations for Outpatient Follow-up:  ? ?Follow up with PCP in 1-2 weeks ? ?PCP Please obtain BMP/CBC, 2 view CXR in 1week,  (see Discharge instructions)  ? ?PCP Please follow up on the following pending results: Check CBC, CMP, ionized calcium in 7 to 10 days.  Needs close outpatient oncology follow-up. ? ? ?Home Health: PT, RN   ?Equipment/Devices: as below  ?Consultations: Pulmonary critical care, cardiothoracic surgery ?Discharge Condition: Stable   ?CODE STATUS: Full   ?Diet Recommendation: Heart Healthy   ? ?Diet Order   ? ?       ?  Diet - low sodium heart healthy       ?  ?  Diet regular Room service appropriate? Yes; Fluid consistency: Thin  Diet effective now       ?  ? ?  ?  ? ?  ?  ? ?CC - SOB  ? ?Brief history of present illness from the day of admission and additional interim summary   ? ?61 year old Caucasian male with history of chronic pain, panic attacks, anxiety, tremors who presented to [facility] hospital on 05/22/2021 with generalized weakness, his work-up was suggestive of left-sided empyema requiring chest tube placement, he was seen by pulmonary critical care and IR at [facility] and then transferred to [facility] on 05/30/2021 for further evaluation by cardiothoracic surgery and pulmonary critical team here.  Of note his CT scan done at [facility] is highly suspicious for left bronchus obstruction and underlying malignancy.  He is extremely weak and cachectic. ? ?                                                               Hospital Course  ? ? 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  He was seen here by pulmonary and cardiothoracic surgery, s/p VATS procedure on 06/01/2021.  All blood cultures and pleural fluid cultures have been negative thus far, his plan was discussed by me with ID physician Dr. Jule Ser and cardiothoracic surgeon Dr. Kipp Brood on 06/06/2021.  There is no sign of infection hence Unasyn was stopped on 06/08/2022 no prolonged antibiotics needed.  Chest tube removed on 06/05/2021, now stable, close  to baseline, will be discharged with HHPT, PCP and Oncology follow up. ?  ?2.  New diagnosis of stage IV squamous cell cancer.  Being followed by oncologist here, Port-A-Cath to be placed per oncology on 06/07/2021, of note due to severe claustrophobia patient refused MRI brain and whole-body bone scan, CT scan with without contrast of the brain does not show any metastasis to the brain.  Most likely discharge in the next 1 to 2 days with outpatient follow-up with oncology for outpatient treatment. ?  ?3. Severe protein calorie malnutrition and cachexia.  Placed on protein supplements. ?  ?4.  Severe hypercalcemia with appropriately suppressed PTH.  Stable ACE levels, stable vitamin D 25 and calcitriol PTH RP levels, has been aggressively hydrated and given 3 doses of IV Salmon calcitonin along with Zometa on 06/04/2021.  Calcium levels have improved, PCP to monitor. ?  ?5.  Weakness and deconditioning.  PT OT at home. Discharge on 06/08/2021  ?  ?6.  Iron deficiency with perioperative acute blood loss.  On iron supplements orally, 1 unit of  packed RBC given 06/02/2021 in the OR. ?  ?7.  Chronic neck back pain.  Supportive care. ?  ?8.  Suppressed PTH in response to hypercalcemia likely due to underlying malignancy.  Treat hypercalcemia. ? ? ?Discharge diagnosis   ? ? ?Principal Problem: ?  Empyema of left pleural space (HCC) ?Active Problems: ?  Chronic pain syndrome ?  Pneumonia ?  Physical debility ?  Hyperbilirubinemia ?  Protein-calorie malnutrition, severe ?  Hypercalcemia ?  Pleural effusion, left ?  Thrombocytosis ?  Iron deficiency ?  Chronic back pain ?  Tremor ?  Hypoparathyroidism ([place]) ?  Empyema ([place]) ? ? ? ?Discharge instructions   ? ?Discharge Instructions   ? ? Diet - low sodium heart healthy   Complete by: As directed ?  ? Discharge instructions   Complete by: As directed ?  ? Follow with [facility] [place] in 7 days  ? ?Get CBC, CMP, 2 view Chest X ray -  checked next visit within 1 week by Primary MD  ? ?Activity: As tolerated with Full fall precautions use walker/cane & assistance as needed ? ?Disposition Home   ? ?Diet: Heart Healthy   ? ?Special Instructions: If you have smoked or chewed Tobacco  in the last 2 yrs please stop smoking, stop any regular Alcohol  and or any Recreational drug use. ? ?On your next visit with your primary care physician please Get Medicines reviewed and adjusted. ? ?Please request your Prim.MD to go over all Hospital Tests and Procedure/Radiological results at the follow up, please get all Hospital records sent to your Prim MD by signing hospital release before you go home. ? ?If you experience worsening of your admission symptoms, develop shortness of breath, life threatening emergency, suicidal or homicidal thoughts you must seek medical attention immediately by calling 911 or calling your MD immediately  if symptoms less severe. ? ?You Must read complete instructions/literature along with all the possible adverse reactions/side effects for all the Medicines you take and  that have been prescribed to you. Take any new Medicines after you have completely understood and accpet all the possible adverse reactions/side effects.  ? Discharge wound care:   Complete by: As directed ?  ? Kindly keep your chest tube site clean and dry  ? Increase activity slowly   Complete by: As directed ?  ? ?  ? ? ?Discharge Medications  ? ?  Allergies as of 06/08/2021   ? ?   Reactions  ? Codeine Other (See Comments)  ? Critical  ? ?  ? ?  ?Medication List  ?  ? ?STOP taking these medications   ? ?Ampicillin-Sulbactam 3 g in sodium chloride 0.9 % 100 mL ?  ?enoxaparin 40 MG/0.4ML injection ?Commonly known as: LOVENOX ?  ?fentaNYL 50 MCG/ML injection ?Commonly known as: SUBLIMAZE ?  ?ibuprofen 800 MG tablet ?Commonly known as: ADVIL ?  ? ?  ? ?TAKE these medications   ? ?acetaminophen 325 MG tablet ?Commonly known as: TYLENOL ?Take 650 mg by mouth every 6 (six) hours as needed. ?What changed: Another medication with the same name was removed. Continue taking this medication, and follow the directions you see here. ?  ?ALPRAZolam 0.25 MG tablet ?Commonly known as: Duanne Moron ?Take 1 tablet (0.25 mg total) by mouth 3 (three) times daily as needed for anxiety. ?  ?alum & mag hydroxide-simeth 200-200-20 MG/5ML suspension ?Commonly known as: MAALOX/MYLANTA ?Take 30 mLs by mouth every 4 (four) hours as needed for indigestion. ?  ?feeding supplement Liqd ?Take 237 mLs by mouth 3 (three) times daily between meals. ?  ?ferrous sulfate 325 (65 FE) MG tablet ?Take 1 tablet (325 mg total) by mouth every other day. ?  ?gabapentin 300 MG capsule ?Commonly known as: NEURONTIN ?Take 300 mg by mouth 2 (two) times daily. ?  ?midodrine 5 MG tablet ?Commonly known as: PROAMATINE ?Take 1 tablet (5 mg total) by mouth 3 (three) times daily with meals. ?  ?multivitamin with minerals Tabs tablet ?Take 1 tablet by mouth daily. ?  ?ondansetron 4 MG tablet ?Commonly known as: ZOFRAN ?Take 1 tablet (4 mg total) by mouth every 6 (six) hours as  needed for nausea. ?  ?oxyCODONE 5 MG immediate release tablet ?Commonly known as: Oxy IR/ROXICODONE ?Take 1 tablet (5 mg total) by mouth every 6 (six) hours as needed for severe pain. ?What changed: how much t

## 2021-06-08 NOTE — Plan of Care (Signed)
°  Problem: Education: °Goal: Knowledge of General Education information will improve °Description: Including pain rating scale, medication(s)/side effects and non-pharmacologic comfort measures °Outcome: Progressing °  °Problem: Health Behavior/Discharge Planning: °Goal: Ability to manage health-related needs will improve °Outcome: Progressing °  °Problem: Clinical Measurements: °Goal: Ability to maintain clinical measurements within normal limits will improve °Outcome: Progressing °Goal: Will remain free from infection °Outcome: Progressing °Goal: Diagnostic test results will improve °Outcome: Progressing °Goal: Respiratory complications will improve °Outcome: Progressing °Goal: Cardiovascular complication will be avoided °Outcome: Progressing °  °Problem: Activity: °Goal: Risk for activity intolerance will decrease °Outcome: Progressing °  °Problem: Coping: °Goal: Level of anxiety will decrease °Outcome: Progressing °  °Problem: Elimination: °Goal: Will not experience complications related to bowel motility °Outcome: Progressing °  °Problem: Pain Managment: °Goal: General experience of comfort will improve °Outcome: Progressing °  °Problem: Safety: °Goal: Ability to remain free from injury will improve °Outcome: Progressing °  °Problem: Skin Integrity: °Goal: Risk for impaired skin integrity will decrease °Outcome: Progressing °  °

## 2021-06-08 NOTE — Progress Notes (Signed)
Received request for [facility] placement. ?Patient d/c home prior to IR schedule being able to accommodate. ?Have informed ordering provider, Dr. Marin Olp, Pt can be scheduled as an outpatient, if still needed, with a new order.   ? ?Electronically Signed: ?Pasty Spillers, PA ?06/08/2021, 11:01 AM ? ? ?

## 2021-06-09 ENCOUNTER — Encounter: Payer: Self-pay | Admitting: *Deleted

## 2021-06-09 ENCOUNTER — Encounter (HOSPITAL_COMMUNITY): Payer: Self-pay

## 2021-06-09 DIAGNOSIS — C3490 Malignant neoplasm of unspecified part of unspecified bronchus or lung: Secondary | ICD-10-CM

## 2021-06-09 NOTE — Progress Notes (Signed)
Patient discharged from the hospital on 06/08/2021. He will need to have a port placed and follow up with Dr Marin Olp for treatment discussion.  ? ?Order for port placed and IR contacted for appointment. They will reach out to patient to schedule.  ? ?Oncology Nurse Navigator Documentation ? ? ?  06/09/2021  ?  8:45 AM  ?Oncology Nurse Navigator Flowsheets  ?Navigator Follow Up Date: 06/10/2021  ?Navigator Follow Up Reason: Appointment Review  ?Navigator Location [facility]  ?Patient Visit Type MedOnc  ?Treatment Phase Pre-Tx/Tx Discussion  ?Barriers/Navigation Needs Coordination of Care  ?Interventions Coordination of Care  ?Acuity Level 2-Minimal Needs (1-2 Barriers Identified)  ?Coordination of Care Radiology  ?Support Groups/Services Friends and Family  ?Time Spent with Patient 15  ?  ?

## 2021-06-10 ENCOUNTER — Encounter: Payer: Self-pay | Admitting: *Deleted

## 2021-06-10 ENCOUNTER — Other Ambulatory Visit (HOSPITAL_COMMUNITY): Payer: Self-pay

## 2021-06-10 ENCOUNTER — Other Ambulatory Visit: Payer: Self-pay | Admitting: *Deleted

## 2021-06-10 NOTE — Progress Notes (Signed)
The proposed treatment discussed in cancer conference is for discussion purpose only and is not a binding recommendation. The patient was not physically examined nor present for their treatment options. Therefore, final treatment plans cannot be decided.  ?

## 2021-06-10 NOTE — Progress Notes (Signed)
Patient's case discussed at thoracic cancer conference today.  See TB flow sheet for an update.  ?

## 2021-06-11 ENCOUNTER — Encounter (HOSPITAL_COMMUNITY): Payer: Self-pay

## 2021-06-14 ENCOUNTER — Encounter: Payer: Self-pay | Admitting: *Deleted

## 2021-06-14 ENCOUNTER — Ambulatory Visit: Payer: Medicaid Other | Admitting: Cardiothoracic Surgery

## 2021-06-14 ENCOUNTER — Ambulatory Visit: Payer: Self-pay

## 2021-06-14 ENCOUNTER — Other Ambulatory Visit: Payer: Self-pay | Admitting: *Deleted

## 2021-06-14 MED ORDER — ALPRAZOLAM 0.25 MG PO TABS: 0.2500 mg | ORAL_TABLET | Freq: Three times a day (TID) | ORAL | 0 refills | Status: DC | PRN

## 2021-06-14 NOTE — Progress Notes (Signed)
Patient has been unreachable to schedule a port and follow up in this office. Spoke to Dr Marin Olp who recommends reaching out to his daughter, [name].  ? ?Called [name] and she is very helpful and suggests just calling her when needing to make contact. I gave her the number to IR and requested that she schedule her fathers port placement. Informed her that once the port placement was confirmed, we could then schedule follow up in this office to start treatment.  ? ?[name] will call to schedule. She does mention that her dad has anxiety and will need medication for anxiety to be able to attend these appointments. Reviewed med list that shows he is on Xanax TID. She states he has not had this prescription for sometime.  ? ?Spoke to Dr Marin Olp. We can refill this prescription so that patient has his anxiety medication.  ? ?Oncology Nurse Navigator Documentation ? ? ?  06/14/2021  ?  3:00 PM  ?Oncology Nurse Navigator Flowsheets  ?Navigator Follow Up Date: 06/15/2021  ?Navigator Follow Up Reason: Appointment Review  ?Navigator Location [facility]  ?Navigator Encounter Type Telephone;Appt/Treatment Plan Review  ?Patient Visit Type MedOnc  ?Treatment Phase Pre-Tx/Tx Discussion  ?Barriers/Navigation Needs Coordination of Care  ?Interventions Coordination of Care  ?Acuity Level 2-Minimal Needs (1-2 Barriers Identified)  ?Coordination of Care Other  ?Support Groups/Services Friends and Family  ?Time Spent with Patient 30  ?  ?

## 2021-06-15 ENCOUNTER — Encounter: Payer: Self-pay | Admitting: *Deleted

## 2021-06-15 NOTE — Progress Notes (Signed)
Daughter had still not scheduled port, so I scheduled port placement for 06/18/2021. Also scheduled follow up appointment with Dr Marin Olp for 06/17/2021. ? ?Called and spoke to daughter, Hildebran. She is aware of follow up appointment with Dr Marin Olp on 06/17/2021 including date, time and location. She understands that this appointment will be to discuss upcoming treatment.  ? ?Also reviewed port appointment with her. She is aware of date, time and location. Also gave her the following prep: arrive @ 11:30am, NPO 7am, driver and 24 supervision. She confirms all with teach back.  ? ?Oncology Nurse Navigator Documentation ? ? ?  06/15/2021  ?  3:15 PM  ?Oncology Nurse Navigator Flowsheets  ?Navigator Follow Up Date: 06/17/2021  ?Navigator Follow Up Reason: Follow-up Appointment  ?Navigator Location [facility]  ?Navigator Encounter Type Telephone;Appt/Treatment Plan Review  ?Patient Visit Type MedOnc  ?Treatment Phase Pre-Tx/Tx Discussion  ?Barriers/Navigation Needs Coordination of Care  ?Interventions Coordination of Care  ?Acuity Level 2-Minimal Needs (1-2 Barriers Identified)  ?Coordination of Care Appts;Radiology  ?Support Groups/Services Friends and Family  ?Time Spent with Patient 45  ?  ?

## 2021-06-17 ENCOUNTER — Telehealth: Payer: Self-pay

## 2021-06-17 ENCOUNTER — Encounter: Payer: Self-pay | Admitting: *Deleted

## 2021-06-17 ENCOUNTER — Other Ambulatory Visit (HOSPITAL_COMMUNITY): Payer: Self-pay | Admitting: Physician Assistant

## 2021-06-17 ENCOUNTER — Inpatient Hospital Stay: Payer: Medicaid Other | Admitting: Hematology & Oncology

## 2021-06-17 ENCOUNTER — Inpatient Hospital Stay: Payer: Medicaid Other

## 2021-06-17 NOTE — Progress Notes (Signed)
Patient was a no-show to todays appointment. Desk RN called patient's daughter and there are many issues right now including patient anxiety, her mother also admitted to the hospital and her sister currently admitted to behavioral health. Lucas Burke doesn't believe her father will be at his port appointment tomorrow. Patient doesn't answer his phone when calls attempted. ? ?Notified IR that patient will likely not come to his appointment. They tried to reach patient unsuccessfully as well.  ? ?Will follow back up next week in hopes that we can get patient scheduled. ? ?Oncology Nurse Navigator Documentation ? ? ?  06/17/2021  ?  3:15 PM  ?Oncology Nurse Navigator Flowsheets  ?Navigator Follow Up Date: 06/22/2021  ?Navigator Follow Up Reason: Patient Call  ?Navigator Location [facility]  ?Navigator Encounter Type Appt/Treatment Plan Review  ?Patient Visit Type MedOnc  ?Treatment Phase Pre-Tx/Tx Discussion  ?Barriers/Navigation Needs Coordination of Care;Education;Language/Communication;Personal Conflicts  ?Interventions Coordination of Care  ?Acuity Level 4-High Needs (Greater Than 4 Barriers Identified)  ?Coordination of Care Appts;Radiology  ?Support Groups/Services Friends and Family  ?Time Spent with Patient 15  ?  ?

## 2021-06-17 NOTE — Telephone Encounter (Signed)
This RN reached out to patient's daughter [name] to discuss why patient did not show for his appointment. [name] stated that she had talked to her dad and that "his nerves just got the best of him." [name] stated that their is a lot going on in the family and that she is unable to be here to get him to appointments. She stated she is hoping other family will help but stated his other daughter was recently admitted to a psych ward due to "not handling the diagnosis well"  Clarified with [facility] that patient did have the xanax prescription and was taking medication. Informed [name] that if needed one of the nurses could meet patient in the lobby and sit with him through the appointments or have a family member with him to help with his anxiety. Discussed whether or not patient will go for his port placement and she stated "go ahead and cancel the appointment" She stated that scheduling could call and reschedule with her for next week. Roselyn Reef, RN oncology navigator aware. Emotional support given to daughter and she is aware that if there is anything this office can do to help assist her dad or her for appointments or treatment we will do our best to provide aide. Odessa verbalized understanding and was very thankful for "how awesome the office has been thus far and so sorry he didn't make it today"  All questions answered and understanding verbalized.  ?

## 2021-06-18 ENCOUNTER — Telehealth: Payer: Self-pay | Admitting: Hematology & Oncology

## 2021-06-18 ENCOUNTER — Ambulatory Visit (HOSPITAL_COMMUNITY): Payer: Medicaid Other

## 2021-06-18 ENCOUNTER — Inpatient Hospital Stay (HOSPITAL_COMMUNITY): Admission: RE | Admit: 2021-06-18 | Payer: Medicaid Other | Source: Ambulatory Visit

## 2021-06-21 ENCOUNTER — Ambulatory Visit: Payer: Self-pay

## 2021-06-22 ENCOUNTER — Encounter: Payer: Self-pay | Admitting: *Deleted

## 2021-06-22 ENCOUNTER — Encounter: Payer: Self-pay | Admitting: Hematology & Oncology

## 2021-06-22 ENCOUNTER — Inpatient Hospital Stay (HOSPITAL_BASED_OUTPATIENT_CLINIC_OR_DEPARTMENT_OTHER): Payer: Medicaid Other | Admitting: Hematology & Oncology

## 2021-06-22 ENCOUNTER — Inpatient Hospital Stay: Payer: Medicaid Other | Attending: Hematology & Oncology

## 2021-06-22 DIAGNOSIS — C3492 Malignant neoplasm of unspecified part of left bronchus or lung: Secondary | ICD-10-CM | POA: Insufficient documentation

## 2021-06-22 DIAGNOSIS — Z79899 Other long term (current) drug therapy: Secondary | ICD-10-CM | POA: Insufficient documentation

## 2021-06-22 DIAGNOSIS — J90 Pleural effusion, not elsewhere classified: Secondary | ICD-10-CM | POA: Diagnosis not present

## 2021-06-22 HISTORY — DX: Hypercalcemia: E83.52

## 2021-06-22 HISTORY — DX: Malignant neoplasm of unspecified part of left bronchus or lung: C34.92

## 2021-06-22 LAB — CMP (CANCER CENTER ONLY)
ALT: 15 U/L (ref 0–44)
AST: 12 U/L — ABNORMAL LOW (ref 15–41)
Albumin: 3.6 g/dL (ref 3.5–5.0)
Alkaline Phosphatase: 122 U/L (ref 38–126)
Anion gap: 10 (ref 5–15)
BUN: 24 mg/dL — ABNORMAL HIGH (ref 8–23)
CO2: 31 mmol/L (ref 22–32)
Calcium: 11.6 mg/dL — ABNORMAL HIGH (ref 8.9–10.3)
Chloride: 97 mmol/L — ABNORMAL LOW (ref 98–111)
Creatinine: 1.13 mg/dL (ref 0.61–1.24)
GFR, Estimated: >60 mL/min (ref >60)
Glucose, Bld: 104 mg/dL — ABNORMAL HIGH (ref 70–99)
Potassium: 5.1 mmol/L (ref 3.5–5.1)
Sodium: 138 mmol/L (ref 135–145)
Total Bilirubin: 0.4 mg/dL (ref 0.3–1.2)
Total Protein: 8.5 g/dL — ABNORMAL HIGH (ref 6.5–8.1)

## 2021-06-22 LAB — CBC WITH DIFFERENTIAL (CANCER CENTER ONLY)
Abs Immature Granulocytes: 0.06 10*3/uL (ref 0.00–0.07)
Basophils Absolute: 0.1 10*3/uL (ref 0.0–0.1)
Basophils Relative: 1 %
Eosinophils Absolute: 0.4 10*3/uL (ref 0.0–0.5)
Eosinophils Relative: 3 %
HCT: 39.7 % (ref 39.0–52.0)
Hemoglobin: 12.3 g/dL — ABNORMAL LOW (ref 13.0–17.0)
Immature Granulocytes: 1 %
Lymphocytes Relative: 20 %
Lymphs Abs: 2.6 10*3/uL (ref 0.7–4.0)
MCH: 26.5 pg (ref 26.0–34.0)
MCHC: 31 g/dL (ref 30.0–36.0)
MCV: 85.4 fL (ref 80.0–100.0)
Monocytes Absolute: 1.8 10*3/uL — ABNORMAL HIGH (ref 0.1–1.0)
Monocytes Relative: 14 %
Neutro Abs: 8.3 10*3/uL — ABNORMAL HIGH (ref 1.7–7.7)
Neutrophils Relative %: 61 %
Platelet Count: 490 10*3/uL — ABNORMAL HIGH (ref 150–400)
RBC: 4.65 MIL/uL (ref 4.22–5.81)
RDW: 14.6 % (ref 11.5–15.5)
WBC Count: 13.2 10*3/uL — ABNORMAL HIGH (ref 4.0–10.5)
nRBC: 0 % (ref 0.0–0.2)

## 2021-06-22 LAB — PREALBUMIN: Prealbumin: 25.3 mg/dL (ref 18–38)

## 2021-06-22 LAB — LACTATE DEHYDROGENASE: LDH: 136 U/L (ref 98–192)

## 2021-06-22 MED ORDER — OXYCODONE HCL 5 MG PO TABS: 5.0000 mg | ORAL_TABLET | ORAL | 0 refills | Status: DC | PRN

## 2021-06-22 NOTE — Telephone Encounter (Signed)
Per 06/22/21 los - gave upcoming appointments - confirmed ?

## 2021-06-22 NOTE — Addendum Note (Signed)
Addended by: Burney Gauze R on: 06/22/2021 01:28 PM ? ? Modules accepted: Orders ? ?

## 2021-06-22 NOTE — Progress Notes (Signed)
?Hematology and Oncology Follow Up Visit ? ?WACEY ZIEGER ?[id number] ?[birth year] 61 y.o. ?06/22/2021 ? ? 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 ?Current Therapy:   ?Opdivo/Yervoy-start cycle 1 on 07/02/2021 ?Zometa 4 mg IV monthly -start 07/02/2021 ?    ?Interim History:  Mr. Arriaga is his back.  This is a first office visit.  I saw him in the hospital.  He went in with a large pleural effusion on the left side.  He had empyema.  He had a chest tube placed.  He ultimately was found to have a poorly differentiated squamous cell carcinoma. ? ?He is stage IV.  As such, he is certainly treatable but not curable. ? ?He comes in with a daughter-in-law.  He is in transition right now.  He will be moving to a trailer that I think is on his son's property.  This will help him out.  I think he has 2 children that he lives with who are quite young. ? ?He is quite nervous and anxious.  He has always had this problem ever since we first saw him in the hospital.  He actually looks much better than I would have thought.  He seems to be eating a little bit better.  I did give him some food from our pantry to try to help out his overall nutritional state. ? ?He has had some pain issues.  This is truly understandable.  He has quite a bit of tumor volume over on the left side. ? ?He has had no bleeding.  He has had no nausea or vomiting.  There is been no change in bowel or bladder habits.  He has had limited constipation from pain medication. ? ?There has been no problems with leg swelling.  He has had no rashes. ? ?We did send off molecular markers which were all unremarkable.  He does have a high PD-L1. ? ?His performance status is probably ECOG 2, at best, so I think that if you are going to treat him it might just be with immunotherapy. ? ?I talked to he and his daughter-in-law about this.  He had a daughter, who is a Marine scientist, on the mobile phone  listening. ? ?I think that Rae Halsted would be a reasonable option for him.  I think he could tolerate this.  I told him that may take a while before this starts to work.  As such, we will have to be careful.  He does have some hypercalcemia today.  He really is not symptomatic with it. ? ?I went over side effects with he and his daughter-in-law.  I told him about the possibility of diarrhea.  I told him that we watch his thyroid and liver test closely.  I talked about the rash. ? ?Medications:  ?Current Outpatient Medications:  ?  acetaminophen (TYLENOL) 325 MG tablet, Take 650 mg by mouth every 6 (six) hours as needed., Disp: , Rfl:  ?  ALPRAZolam (XANAX) 0.25 MG tablet, Take 1 tablet (0.25 mg total) by mouth 3 (three) times daily as needed for anxiety., Disp: 60 tablet, Rfl: 0 ?  alum & mag hydroxide-simeth (MAALOX/MYLANTA) 200-200-20 MG/5ML suspension, Take 30 mLs by mouth every 4 (four) hours as needed for indigestion., Disp: 355 mL, Rfl: 0 ?  feeding supplement (ENSURE ENLIVE / ENSURE PLUS) LIQD, Take 237 mLs by mouth 3 (three) times daily between meals., Disp: 237 mL, Rfl: 12 ?  gabapentin (NEURONTIN) 300  MG capsule, Take 300 mg by mouth 2 (two) times daily., Disp: , Rfl:  ?  midodrine (PROAMATINE) 5 MG tablet, Take 1 tablet (5 mg total) by mouth 3 (three) times daily with meals., Disp: 90 tablet, Rfl: 0 ?  Multiple Vitamin (MULTIVITAMIN WITH MINERALS) TABS tablet, Take 1 tablet by mouth daily., Disp: , Rfl:  ?  oxyCODONE (OXY IR/ROXICODONE) 5 MG immediate release tablet, Take 1 tablet (5 mg total) by mouth every 6 (six) hours as needed for severe pain., Disp: 10 tablet, Rfl: 0 ?  pantoprazole (PROTONIX) 40 MG tablet, Take 1 tablet (40 mg total) by mouth daily., Disp: 30 tablet, Rfl: 0 ?  PARoxetine (PAXIL) 20 MG tablet, Take 1 tablet by mouth daily., Disp: , Rfl:  ?  ferrous sulfate 325 (65 FE) MG tablet, Take 1 tablet (325 mg total) by mouth every other day., Disp: , Rfl: 3 ?  ondansetron (ZOFRAN) 4 MG  tablet, Take 1 tablet (4 mg total) by mouth every 6 (six) hours as needed for nausea., Disp: 20 tablet, Rfl: 0 ?  sodium chloride 0.9 % infusion, Inject 10 mLs into the vein as needed (for administration of IV medications (carrier fluid))., Disp: , Rfl: 0 ?  traMADol (ULTRAM) 50 MG tablet, Take 2 tablets (100 mg total) by mouth every 6 (six) hours as needed for moderate pain., Disp: 12 tablet, Rfl:  ? ?Allergies:  ?Allergies  ?Allergen Reactions  ? Codeine Other (See Comments)  ?  Critical  ? ? ?Past Medical History, Surgical history, Social history, and Family History were reviewed and updated. ? ?Review of Systems: ?Review of Systems  ?Constitutional:  Positive for unexpected weight change.  ?HENT:  Negative.    ?Eyes: Negative.   ?Respiratory:  Positive for chest tightness.   ?Cardiovascular:  Positive for chest pain.  ?Gastrointestinal:  Positive for constipation.  ?Endocrine: Negative.   ?Genitourinary: Negative.    ?Musculoskeletal:  Positive for back pain.  ?Skin: Negative.   ?Neurological: Negative.   ?Hematological: Negative.   ?Psychiatric/Behavioral:  The patient is nervous/anxious.   ? ?Physical Exam: ? height is 6' (1.829 m) and weight is 157 lb (71.2 kg). His oral temperature is 97.9 ?F (36.6 ?C). His blood pressure is 113/82 and his pulse is 80. His respiration is 18 and oxygen saturation is 97%.  ? ?Wt Readings from Last 3 Encounters:  ?06/22/21 157 lb (71.2 kg)  ?05/31/21 167 lb 8.8 oz (76 kg)  ?05/22/21 157 lb 6.5 oz (71.4 kg)  ? ? ?Physical Exam ?Vitals reviewed.  ?HENT:  ?   Head: Normocephalic and atraumatic.  ?Eyes:  ?   Pupils: Pupils are equal, round, and reactive to light.  ?Cardiovascular:  ?   Rate and Rhythm: Normal rate and regular rhythm.  ?   Heart sounds: Normal heart sounds.  ?Pulmonary:  ?   Effort: Pulmonary effort is normal.  ?   Breath sounds: Normal breath sounds.  ?Abdominal:  ?   General: Bowel sounds are normal.  ?   Palpations: Abdomen is soft.  ?Musculoskeletal:     ?    General: No tenderness or deformity. Normal range of motion.  ?   Cervical back: Normal range of motion.  ?Lymphadenopathy:  ?   Cervical: No cervical adenopathy.  ?Skin: ?   General: Skin is warm and dry.  ?   Findings: No erythema or rash.  ?Neurological:  ?   Mental Status: He is alert and oriented to person, place, and time.  ?  Psychiatric:     ?   Behavior: Behavior normal.     ?   Thought Content: Thought content normal.     ?   Judgment: Judgment normal.  ? ? ? ?Lab Results  ?Component Value Date  ? WBC 13.2 (H) 06/22/2021  ? HGB 12.3 (L) 06/22/2021  ? HCT 39.7 06/22/2021  ? MCV 85.4 06/22/2021  ? PLT 490 (H) 06/22/2021  ? ?  Chemistry   ?   ?Component Value Date/Time  ? NA 138 06/22/2021 1130  ? NA 141 05/23/2019 1542  ? K 5.1 06/22/2021 1130  ? CL 97 (L) 06/22/2021 1130  ? CO2 31 06/22/2021 1130  ? BUN 24 (H) 06/22/2021 1130  ? BUN 16 05/23/2019 1542  ? CREATININE 1.13 06/22/2021 1130  ?    ?Component Value Date/Time  ? CALCIUM 11.6 (H) 06/22/2021 1130  ? ALKPHOS 122 06/22/2021 1130  ? AST 12 (L) 06/22/2021 1130  ? ALT 15 06/22/2021 1130  ? BILITOT 0.4 06/22/2021 1130  ?  ? ?Impression and Plan: ?Mr. Liskey is a very nice 61 year old white male.  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 ? ?He presented with a large pleural effusion and empyema.  They needed to have surgery to help clear this out.  He had biopsies that were taken.  He had a chest tube in place for a while. ? ?Again, we can treat this but we cannot cure this.  He has 2 small children.  Our goal is to try to make sure he is able to see them grow up. ? ?I know he is not in the best of shape.  His performance status is marginal.  I do still think he is going to handle immunotherapy with chemotherapy. ? ?I do think that combination immunotherapy would be reasonable.  He does have a high PD-L1 score. ? ?Again, I told him that it may take a while before treatment works. ? ?I probably would give him a good  cycle or 2 of treatment before I would do another scan on him.  I do not think we need a PET scan on him.  I think just a CT scan would be reasonable. ? ?We will try to get started next week. ? ?Again the hypercalce

## 2021-06-22 NOTE — Progress Notes (Signed)
Patient was able to come in today with family support to establish care. This navigator was out of the office.  ? ?Initial RN Navigator Patient Visit ? 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 ?Patient given "Your Patient Navigator" handout which explains my role, areas in which I am able to help, and all the contact information for myself and the office. Also gave patient MD and Navigator business card. Reviewed with patient the general overview of expected course after initial diagnosis and time frame for all steps to be completed. ? ?New patient packet given to patient which includes: orientation to office and staff; campus directory; education on My Chart and Advance Directives; and patient centered education on Lung Cancer ? ?Patient completed visit with Dr. Marin Olp. ? ?Patient will begin immunotherapy next week. We will hold on port placement at this time.  ? ?Spoke to chemo educator and we will complete chemo education on his first treatment day.  ? ?Patient understands all follow up procedures and expectations. They have my number to reach out for any further clarification or additional needs.  ? ?Oncology Nurse Navigator Documentation ? ? ?  06/22/2021  ?  1:45 PM  ?Oncology Nurse Navigator Flowsheets  ?Planned Course of Treatment Chemotherapy  ?Phase of Treatment Chemo  ?Chemotherapy Pending- Reason: Patient Request/Initiated  ?Navigator Follow Up Date: 07/02/2021  ?Navigator Follow Up Reason: Chemotherapy  ?Navigator Location [facility]  ?Navigator Encounter Type Initial MedOnc  ?Patient Visit Type MedOnc  ?Treatment Phase Pre-Tx/Tx Discussion  ?Barriers/Navigation Needs Coordination of Care;Education;Language/Communication;Personal Conflicts  ?Education Newly Diagnosed Cancer Education;Pain/ Symptom Management  ?Interventions Education  ?Acuity Level 2-Minimal Needs (1-2 Barriers Identified)  ?Education Method  Verbal;Written  ?Support Groups/Services Friends and Family  ?Time Spent with Patient 30  ?  ?

## 2021-06-23 ENCOUNTER — Encounter (HOSPITAL_COMMUNITY): Payer: Self-pay | Admitting: Surgery

## 2021-06-23 ENCOUNTER — Other Ambulatory Visit: Payer: Self-pay | Admitting: *Deleted

## 2021-06-25 NOTE — Progress Notes (Unsigned)
Pharmacist Chemotherapy Monitoring - Initial Assessment   ? ?Anticipated start date: ***  ? ?The following has been reviewed per standard work regarding the patient's treatment regimen: ?The patient's diagnosis, treatment plan and drug doses, and organ/hematologic function ?Lab orders and baseline tests specific to treatment regimen  ?The treatment plan start date, drug sequencing, and pre-medications ?Prior authorization status  ?Patient's documented medication list, including drug-drug interaction screen and prescriptions for anti-emetics and supportive care specific to the treatment regimen ?The drug concentrations, fluid compatibility, administration routes, and timing of the medications to be used ?The patient's access for treatment and lifetime cumulative dose history, if applicable  ?The patient's medication allergies and previous infusion related reactions, if applicable  ? ?Changes made to treatment plan:  ?N/A ? ?Follow up needed:  ?N/A ? ? ?Claybon Jabs, [facility], ?06/25/2021  7:39 AM  ?

## 2021-06-30 NOTE — Progress Notes (Deleted)
      [address]       [place] [address]             [phone]       HPI: [name] is a 61 year old gentleman with a past history of chronic neck, back, and hip pain and anxiety who was referred to [place] back in late March of this year for a large left loculated pleural effusion that was felt to be due to an empyema.  He was ultimately discovered to have stage IV poorly differentiated squamous cell carcinoma with obstruction and a malignant effusion.  He was seen in consultation in the hospital by Dr. Marin Olp, oncology, and has also seen him in post discharge follow-up.  Combination immunotherapy and chemotherapy has been recommended and plans are underway to begin treatment. Patient returns for routine postoperative follow-up having undergone left video-assisted thoracoscopy by Dr. Darcey Nora on 06/01/2021. The patient's early postoperative recovery while in the hospital was notable for ***. Since hospital discharge the patient reports ***.   Current Outpatient Medications  Medication Sig Dispense Refill   acetaminophen (TYLENOL) 325 MG tablet Take 650 mg by mouth every 6 (six) hours as needed.     ALPRAZolam (XANAX) 0.25 MG tablet Take 1 tablet (0.25 mg total) by mouth 3 (three) times daily as needed for anxiety. 60 tablet 0   alum & mag hydroxide-simeth (MAALOX/MYLANTA) 200-200-20 MG/5ML suspension Take 30 mLs by mouth every 4 (four) hours as needed for indigestion. 355 mL 0   feeding supplement (ENSURE ENLIVE / ENSURE PLUS) LIQD Take 237 mLs by mouth 3 (three) times daily between meals. 237 mL 12   midodrine (PROAMATINE) 5 MG tablet Take 1 tablet (5 mg total) by mouth 3 (three) times daily with meals. 90 tablet 0   Multiple Vitamin (MULTIVITAMIN WITH MINERALS) TABS tablet Take 1 tablet by mouth daily.     ondansetron (ZOFRAN) 4 MG tablet Take 1 tablet (4 mg total) by mouth every 6 (six) hours as needed for nausea. 20 tablet 0   oxyCODONE (OXY IR/ROXICODONE) 5 MG  immediate release tablet Take 1 tablet (5 mg total) by mouth every 4 (four) hours as needed for severe pain. 90 tablet 0   pantoprazole (PROTONIX) 40 MG tablet Take 1 tablet (40 mg total) by mouth daily. 30 tablet 0   PARoxetine (PAXIL) 20 MG tablet Take 1 tablet by mouth daily.     No current facility-administered medications for this visit.    Physical Exam: ***  Diagnostic Tests: ***  Impression: ***  Plan: ***   Antony Odea, PA-C [facility] [phone]

## 2021-07-01 ENCOUNTER — Ambulatory Visit: Payer: Self-pay

## 2021-07-01 ENCOUNTER — Other Ambulatory Visit: Payer: Self-pay | Admitting: *Deleted

## 2021-07-01 DIAGNOSIS — C3492 Malignant neoplasm of unspecified part of left bronchus or lung: Secondary | ICD-10-CM

## 2021-07-02 ENCOUNTER — Encounter: Payer: Self-pay | Admitting: *Deleted

## 2021-07-02 ENCOUNTER — Inpatient Hospital Stay: Payer: Medicaid Other

## 2021-07-02 NOTE — Progress Notes (Signed)
Received notification that patient cancelled today's appointment and rescheduled to next week. He was to start treatment today.  ? ?Called patient with no answer. Left message on his voicemail.  ? ?Called his daughter [name]. She states that patient is moving today and they had to reschedule his appointment. She is confident that he will be here Tuesday. She states her father does continue to have anxiety and c/o increased pain to his L chest. He is taking his medicine every 8 hours. ? ?Reviewed the prescription with [name] including that he can take his medication every 4 hours. She stated she would talk to her dad. She mentioned having him on longer acting medication with breakthrough. I made a note on his appointment so this can be discussed on Tuesday.  ? ?His chemo class is also rescheduled to Tuesday. Chemo educator aware.  ? ?Oncology Nurse Navigator Documentation ? ? ?  07/02/2021  ?  9:00 AM  ?Oncology Nurse Navigator Flowsheets  ?Navigator Follow Up Date: 07/06/2021  ?Navigator Follow Up Reason: Chemotherapy;Chemo Class  ?Navigator Location [facility]  ?Navigator Encounter Type Telephone  ?Telephone Patient Update;Outgoing Call  ?Patient Visit Type MedOnc  ?Treatment Phase Pre-Tx/Tx Discussion  ?Barriers/Navigation Needs Coordination of Care;Education;Language/Communication;Personal Conflicts  ?Education Pain/ Symptom Management  ?Interventions Education;Psycho-Social Support  ?Acuity Level 2-Minimal Needs (1-2 Barriers Identified)  ?Education Method Verbal  ?Support Groups/Services Friends and Family  ?Time Spent with Patient 30  ?  ?

## 2021-07-06 ENCOUNTER — Inpatient Hospital Stay: Payer: Medicaid Other

## 2021-07-06 ENCOUNTER — Inpatient Hospital Stay: Payer: Medicaid Other | Attending: Hematology & Oncology

## 2021-07-06 DIAGNOSIS — Z79899 Other long term (current) drug therapy: Secondary | ICD-10-CM | POA: Insufficient documentation

## 2021-07-06 DIAGNOSIS — C3492 Malignant neoplasm of unspecified part of left bronchus or lung: Secondary | ICD-10-CM | POA: Insufficient documentation

## 2021-07-06 DIAGNOSIS — Z5112 Encounter for antineoplastic immunotherapy: Secondary | ICD-10-CM | POA: Insufficient documentation

## 2021-07-07 ENCOUNTER — Encounter: Payer: Self-pay | Admitting: *Deleted

## 2021-07-07 NOTE — Progress Notes (Signed)
Patient was a no-show to his treatment appointment on 07/06/2021. This appointment was made after he cancelled his initial appointment to initiate treatment on 07/02/21.  ? ?Called patient and left voicemail requesting call back at 10:05a. ? ?Called patient's daughter, [name], and left voicemail requesting call back at 2:00p. ? ?Oncology Nurse Navigator Documentation ? ? ?  07/07/2021  ?  2:00 PM  ?Oncology Nurse Navigator Flowsheets  ?Navigator Location [facility]  ?Navigator Encounter Type Telephone  ?Telephone Outgoing Call  ?Patient Visit Type MedOnc  ?Treatment Phase Pre-Tx/Tx Discussion  ?Barriers/Navigation Needs Coordination of Care;Education;Language/Communication;Personal Conflicts  ?Interventions Other  ?Acuity Level 2-Minimal Needs (1-2 Barriers Identified)  ?Support Groups/Services Friends and Family  ?Time Spent with Patient 15  ?  ?

## 2021-07-08 ENCOUNTER — Encounter: Payer: Self-pay | Admitting: *Deleted

## 2021-07-08 NOTE — Progress Notes (Signed)
Called patient at 1:30p. No contact made. Message requested call back left on personal voicemail. ? ?Called daughter, [name], at 3:30p. No contact made. ? ?Oncology Nurse Navigator Documentation ? ? ?  07/08/2021  ?  3:30 PM  ?Oncology Nurse Navigator Flowsheets  ?Navigator Location [facility]  ?Navigator Encounter Type Telephone  ?Telephone Outgoing Call  ?Patient Visit Type MedOnc  ?Treatment Phase Pre-Tx/Tx Discussion  ?Barriers/Navigation Needs Coordination of Care;Education;Language/Communication;Personal Conflicts  ?Interventions Other  ?Acuity Level 2-Minimal Needs (1-2 Barriers Identified)  ?Support Groups/Services Friends and Family  ?Time Spent with Patient 15  ?  ?

## 2021-07-16 ENCOUNTER — Other Ambulatory Visit: Payer: Self-pay

## 2021-07-16 ENCOUNTER — Telehealth: Payer: Self-pay

## 2021-07-16 ENCOUNTER — Inpatient Hospital Stay: Payer: Medicaid Other

## 2021-07-16 ENCOUNTER — Encounter: Payer: Self-pay | Admitting: Hematology & Oncology

## 2021-07-16 ENCOUNTER — Inpatient Hospital Stay (HOSPITAL_BASED_OUTPATIENT_CLINIC_OR_DEPARTMENT_OTHER): Payer: Medicaid Other | Admitting: Hematology & Oncology

## 2021-07-16 ENCOUNTER — Encounter: Payer: Self-pay | Admitting: *Deleted

## 2021-07-16 VITALS — BP 103/79 | HR 81 | Temp 97.7°F | Resp 17 | Wt 147.0 lb

## 2021-07-16 VITALS — BP 104/63 | HR 81 | Temp 97.7°F | Resp 18

## 2021-07-16 DIAGNOSIS — C3492 Malignant neoplasm of unspecified part of left bronchus or lung: Secondary | ICD-10-CM

## 2021-07-16 DIAGNOSIS — Z79899 Other long term (current) drug therapy: Secondary | ICD-10-CM | POA: Diagnosis not present

## 2021-07-16 DIAGNOSIS — Z5112 Encounter for antineoplastic immunotherapy: Secondary | ICD-10-CM | POA: Diagnosis present

## 2021-07-16 LAB — CBC WITH DIFFERENTIAL (CANCER CENTER ONLY)
Abs Immature Granulocytes: 0.04 10*3/uL (ref 0.00–0.07)
Basophils Absolute: 0.1 10*3/uL (ref 0.0–0.1)
Basophils Relative: 1 %
Eosinophils Absolute: 0.1 10*3/uL (ref 0.0–0.5)
Eosinophils Relative: 1 %
HCT: 44.9 % (ref 39.0–52.0)
Hemoglobin: 13.6 g/dL (ref 13.0–17.0)
Immature Granulocytes: 0 %
Lymphocytes Relative: 22 %
Lymphs Abs: 2.6 10*3/uL (ref 0.7–4.0)
MCH: 26.2 pg (ref 26.0–34.0)
MCHC: 30.3 g/dL (ref 30.0–36.0)
MCV: 86.5 fL (ref 80.0–100.0)
Monocytes Absolute: 2.2 10*3/uL — ABNORMAL HIGH (ref 0.1–1.0)
Monocytes Relative: 19 %
Neutro Abs: 6.7 10*3/uL (ref 1.7–7.7)
Neutrophils Relative %: 57 %
Platelet Count: 475 10*3/uL — ABNORMAL HIGH (ref 150–400)
RBC: 5.19 MIL/uL (ref 4.22–5.81)
RDW: 15.5 % (ref 11.5–15.5)
WBC Count: 11.7 10*3/uL — ABNORMAL HIGH (ref 4.0–10.5)
nRBC: 0 % (ref 0.0–0.2)

## 2021-07-16 LAB — CMP (CANCER CENTER ONLY)
ALT: <5 U/L (ref 0–44)
AST: 10 U/L — ABNORMAL LOW (ref 15–41)
Albumin: 3.8 g/dL (ref 3.5–5.0)
Alkaline Phosphatase: 107 U/L (ref 38–126)
Anion gap: 10 (ref 5–15)
BUN: 15 mg/dL (ref 8–23)
CO2: 26 mmol/L (ref 22–32)
Calcium: 12.7 mg/dL — ABNORMAL HIGH (ref 8.9–10.3)
Chloride: 97 mmol/L — ABNORMAL LOW (ref 98–111)
Creatinine: 1.33 mg/dL — ABNORMAL HIGH (ref 0.61–1.24)
GFR, Estimated: >60 mL/min (ref >60)
Glucose, Bld: 111 mg/dL — ABNORMAL HIGH (ref 70–99)
Potassium: 4.5 mmol/L (ref 3.5–5.1)
Sodium: 133 mmol/L — ABNORMAL LOW (ref 135–145)
Total Bilirubin: 0.3 mg/dL (ref 0.3–1.2)
Total Protein: 7.9 g/dL (ref 6.5–8.1)

## 2021-07-16 LAB — TSH: TSH: 1.41 u[IU]/mL (ref 0.350–4.500)

## 2021-07-16 MED ORDER — SODIUM CHLORIDE 0.9 % IV SOLN
1.0000 mg/kg | Freq: Once | INTRAVENOUS | Status: AC
  Administered 2021-07-16: 70 mg via INTRAVENOUS
  Filled 2021-07-16: qty 14

## 2021-07-16 MED ORDER — OXYCODONE HCL 5 MG PO TABS: 5.0000 mg | ORAL_TABLET | ORAL | 0 refills | Status: DC | PRN

## 2021-07-16 MED ORDER — ALPRAZOLAM 0.25 MG PO TABS: 0.2500 mg | ORAL_TABLET | Freq: Three times a day (TID) | ORAL | 0 refills | Status: DC | PRN

## 2021-07-16 MED ORDER — FAMOTIDINE IN NACL 20-0.9 MG/50ML-% IV SOLN
20.0000 mg | Freq: Once | INTRAVENOUS | Status: AC
  Administered 2021-07-16: 20 mg via INTRAVENOUS
  Filled 2021-07-16: qty 50

## 2021-07-16 MED ORDER — ZOLEDRONIC ACID 4 MG/100ML IV SOLN
4.0000 mg | Freq: Once | INTRAVENOUS | Status: AC
  Administered 2021-07-16: 4 mg via INTRAVENOUS
  Filled 2021-07-16: qty 100

## 2021-07-16 MED ORDER — SODIUM CHLORIDE 0.9 % IV SOLN: Freq: Once | INTRAVENOUS | Status: AC

## 2021-07-16 MED ORDER — MIDODRINE HCL 5 MG PO TABS: 5.0000 mg | ORAL_TABLET | Freq: Three times a day (TID) | ORAL | 6 refills | Status: DC

## 2021-07-16 MED ORDER — PANTOPRAZOLE SODIUM 40 MG PO TBEC: 40.0000 mg | DELAYED_RELEASE_TABLET | Freq: Every day | ORAL | 6 refills | Status: DC

## 2021-07-16 MED ORDER — SODIUM CHLORIDE 0.9 % IV SOLN
2.8100 mg/kg | Freq: Once | INTRAVENOUS | Status: AC
  Administered 2021-07-16: 200 mg via INTRAVENOUS
  Filled 2021-07-16: qty 20

## 2021-07-16 MED ORDER — DIPHENHYDRAMINE HCL 50 MG/ML IJ SOLN
25.0000 mg | Freq: Once | INTRAMUSCULAR | Status: AC
  Administered 2021-07-16: 25 mg via INTRAVENOUS
  Filled 2021-07-16: qty 1

## 2021-07-16 NOTE — Progress Notes (Signed)
Ipilimumab (YERVOY) Patient Monitoring Assessment  ? ?FIRST TREATMENT ? ?Is the patient experiencing any of the following general symptoms?:  ?[ ] Difficulty performing normal activities ?[ ] Feeling sluggish or cold all the time ?[ ] Unusual weight gain ?[ ] Constant or unusual headaches ?[ ] Feeling dizzy or faint ?[ ] Changes in eyesight (blurry vision, double vision, or other vision problems) ?[ ] Changes in mood or behavior (ex: decreased sex drive, irritability, or forgetfulness) ?[ ] Starting new medications (ex: steroids, other medications that lower immune response) ?[ ] Patient is not experiencing any of the general symptoms above.  ? ?Gastrointestinal  ?Patient is having 1 bowel movements each day.  ?Is this different from baseline? [ ] Yes [ ] No ?Are your stools watery or do they have a foul smell? [ ] Yes [ ] No ?Have you seen blood in your stools? [ ] Yes [ ] No ?Are your stools dark, tarry, or sticky? [ ] Yes [ ] No ?Are you having pain or tenderness in your belly? [ ] Yes [ ] No ? ?Skin ?Does your skin itch? [ ] Yes [ ] No ?Do you have a rash? [ ] Yes [ ] No ?Has your skin blistered and/or peeled? [ ] Yes [ ] No ?Do you have sores in your mouth? [ ] Yes [ ] No ? ?Hepatic ?Has your urine been dark or tea colored? [ ] Yes [ ] No ?Have you noticed that your skin or the whites of your eyes are turning yellow? [ ] Yes [ ] No ?Are you bleeding or bruising more easily than normal? [ ] Yes [ ] No ?Are you nauseous and/or vomiting? [ ] Yes [ ] No ?Do you have pain on the right side of your stomach? [ ] Yes [ ] No ? ?Neurologic  ?Are you having unusual weakness of legs, arms, or face? [ ] Yes [ ] No ?Are you having numbness or tingling in your hands or feet? [ ] Yes [ ] No ? ?Lucas Burke ? ? ?

## 2021-07-16 NOTE — Patient Instructions (Signed)
[facility]  Discharge Instructions: ?Thank you for choosing [facility] to provide your oncology and hematology care.  ? ?If you have a lab appointment with the [facility], please go directly to the [facility] and check in at the registration area. ? ?Wear comfortable clothing and clothing appropriate for easy access to any Portacath or PICC line.  ? ?We strive to give you quality time with your provider. You may need to reschedule your appointment if you arrive late (15 or more minutes).  Arriving late affects you and other patients whose appointments are after yours.  Also, if you miss three or more appointments without notifying the office, you may be dismissed from the clinic at the provider?s discretion.    ?  ?For prescription refill requests, have your pharmacy contact our office and allow 72 hours for refills to be completed.   ? ?Today you received the following chemotherapy and/or immunotherapy agents Opdivo, Yervoy     ?  ?To help prevent nausea and vomiting after your treatment, we encourage you to take your nausea medication as directed. ? ?BELOW ARE SYMPTOMS THAT SHOULD BE REPORTED IMMEDIATELY: ?*FEVER GREATER THAN 100.4 F (38 ?C) OR HIGHER ?*CHILLS OR SWEATING ?*NAUSEA AND VOMITING THAT IS NOT CONTROLLED WITH YOUR NAUSEA MEDICATION ?*UNUSUAL SHORTNESS OF BREATH ?*UNUSUAL BRUISING OR BLEEDING ?*URINARY PROBLEMS (pain or burning when urinating, or frequent urination) ?*BOWEL PROBLEMS (unusual diarrhea, constipation, pain near the anus) ?TENDERNESS IN MOUTH AND THROAT WITH OR WITHOUT PRESENCE OF ULCERS (sore throat, sores in mouth, or a toothache) ?UNUSUAL RASH, SWELLING OR PAIN  ?UNUSUAL VAGINAL DISCHARGE OR ITCHING  ? ?Items with * indicate a potential emergency and should be followed up as soon as possible or go to the Emergency Department if any problems should occur. ? ?Please show the CHEMOTHERAPY ALERT CARD or IMMUNOTHERAPY ALERT CARD at check-in to  the Emergency Department and triage nurse. ?Should you have questions after your visit or need to cancel or reschedule your appointment, please contact [facility]  [phone] and follow the prompts.  Office hours are 8:00 a.m. to 4:30 p.m. Monday - Friday. Please note that voicemails left after 4:00 p.m. may not be returned until the following business day.  We are closed weekends and major holidays. You have access to a nurse at all times for urgent questions. Please call the main number to the clinic [phone] and follow the prompts. ? ?For any non-urgent questions, you may also contact your provider using MyChart. We now offer e-Visits for anyone 93 and older to request care online for non-urgent symptoms. For details visit [email or web address]. ?  ?Also download the MyChart app! Go to the app store, search "MyChart", open the app, select [facility], and log in with your MyChart username and password. ? ?Due to Covid, a mask is required upon entering the hospital/clinic. If you do not have a mask, one will be given to you upon arrival. For doctor visits, patients may have 1 support person aged 44 or older with them. For treatment visits, patients cannot have anyone with them due to current Covid guidelines and our immunocompromised population.  ?

## 2021-07-16 NOTE — Progress Notes (Signed)
Ipilimumab (YERVOY) Patient Monitoring Assessment  ? ?Is the patient experiencing any of the following general symptoms?:  ?[] Difficulty performing normal activities ?[] Feeling sluggish or cold all the time ?[] Unusual weight gain ?[] Constant or unusual headaches ?[] Feeling dizzy or faint ?[] Changes in eyesight (blurry vision, double vision, or other vision problems) ?[] Changes in mood or behavior (ex: decreased sex drive, irritability, or forgetfulness) ?[] Starting new medications (ex: steroids, other medications that lower immune response) ?[x] Patient is not experiencing any of the general symptoms above.  ? ? ?Gastrointestinal  ?Patient is having one bowel movements each day.  ?Is this different from baseline? [] Yes [x] No ?Are your stools watery or do they have a foul smell? [] Yes [x] No ?Have you seen blood in your stools? [] Yes [x] No ?Are your stools dark, tarry, or sticky? [] Yes [x] No ?Are you having pain or tenderness in your belly? [] Yes [x] No ? ?Skin ?Does your skin itch? [] Yes [x] No ?Do you have a rash? [] Yes [x] No ?Has your skin blistered and/or peeled? [] Yes [x] No ?Do you have sores in your mouth? [] Yes [x] No ? ?Hepatic ?Has your urine been dark or tea colored? [] Yes [x] No ?Have you noticed that your skin or the whites of your eyes are turning yellow? [] Yes [x] No ?Are you bleeding or bruising more easily than normal? [] Yes [x] No ?Are you nauseous and/or vomiting? [] Yes [x] No ?Do you have pain on the right side of your stomach? [] Yes [x] No ? ?Neurologic  ?Are you having unusual weakness of legs, arms, or face? [] Yes [x] No ?Are you having numbness or tingling in your hands or feet? [] Yes [x] No ? ?Lucas Burke  ?

## 2021-07-16 NOTE — Progress Notes (Signed)
?Hematology and Oncology Follow Up Visit ? ?Lucas Burke ?[id number] ?04-Sep-1960 61 y.o. ?07/16/2021 ? ? 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 ?Current Therapy:   ?Opdivo/Yervoy-start cycle 1 on 07/16/2021 ?Zometa 4 mg IV monthly -start 07/16/2021 ?    ?Interim History:  Lucas Burke is back for follow-up.  He came in today.  I was little bit worried that he may not have come in.  He has a lot of anxiety. ? ?He is ready to start treatment.  We will try him on immunotherapy.  We will start him on Opdivo and Yervoy. ? ?He is still having some pain issues.  He is on oxycodone for this.  Again he has a lot of anxiety any takes Xanax. ? ?I am little troubled by the fact that his calcium is so high.  It is 12.7 today.  He will get his Zometa today. ? ?He is not complain of any cough or shortness of breath.  He has had no headache.  He has had no dysphagia or odynophagia.  He has had no issues with bowels or bladder although last week he thought he may have had a gastroenteritis and had diarrhea. ? ?He has had no rashes.  He has had no bleeding. ? ?Overall, I would say his performance status is probably ECOG 2.   ? ?Medications:  ?Current Outpatient Medications:  ?  acetaminophen (TYLENOL) 325 MG tablet, Take 650 mg by mouth every 6 (six) hours as needed., Disp: , Rfl:  ?  ALPRAZolam (XANAX) 0.25 MG tablet, Take 1 tablet (0.25 mg total) by mouth 3 (three) times daily as needed for anxiety., Disp: 60 tablet, Rfl: 0 ?  alum & mag hydroxide-simeth (MAALOX/MYLANTA) 200-200-20 MG/5ML suspension, Take 30 mLs by mouth every 4 (four) hours as needed for indigestion., Disp: 355 mL, Rfl: 0 ?  feeding supplement (ENSURE ENLIVE / ENSURE PLUS) LIQD, Take 237 mLs by mouth 3 (three) times daily between meals., Disp: 237 mL, Rfl: 12 ?  midodrine (PROAMATINE) 5 MG tablet, Take 1 tablet (5 mg total) by mouth 3 (three) times daily with meals., Disp: 90 tablet,  Rfl: 0 ?  Multiple Vitamin (MULTIVITAMIN WITH MINERALS) TABS tablet, Take 1 tablet by mouth daily., Disp: , Rfl:  ?  ondansetron (ZOFRAN) 4 MG tablet, Take 1 tablet (4 mg total) by mouth every 6 (six) hours as needed for nausea., Disp: 20 tablet, Rfl: 0 ?  oxyCODONE (OXY IR/ROXICODONE) 5 MG immediate release tablet, Take 1 tablet (5 mg total) by mouth every 4 (four) hours as needed for severe pain., Disp: 90 tablet, Rfl: 0 ?  pantoprazole (PROTONIX) 40 MG tablet, Take 1 tablet (40 mg total) by mouth daily., Disp: 30 tablet, Rfl: 0 ?  PARoxetine (PAXIL) 20 MG tablet, Take 1 tablet by mouth daily., Disp: , Rfl:  ? ?Allergies:  ?Allergies  ?Allergen Reactions  ? Codeine Other (See Comments)  ?  Critical  ? ? ?Past Medical History, Surgical history, Social history, and Family History were reviewed and updated. ? ?Review of Systems: ?Review of Systems  ?Constitutional:  Positive for unexpected weight change.  ?HENT:  Negative.    ?Eyes: Negative.   ?Respiratory:  Positive for chest tightness.   ?Cardiovascular:  Positive for chest pain.  ?Gastrointestinal:  Positive for constipation.  ?Endocrine: Negative.   ?Genitourinary: Negative.    ?Musculoskeletal:  Positive for back pain.  ?Skin: Negative.   ?Neurological: Negative.   ?  Hematological: Negative.   ?Psychiatric/Behavioral:  The patient is nervous/anxious.   ? ?Physical Exam: ? weight is 147 lb (66.7 kg). His oral temperature is 97.7 ?F (36.5 ?C). His blood pressure is 103/79 and his pulse is 81. His respiration is 17 and oxygen saturation is 99%.  ? ?Wt Readings from Last 3 Encounters:  ?07/16/21 147 lb (66.7 kg)  ?06/22/21 157 lb (71.2 kg)  ?05/31/21 167 lb 8.8 oz (76 kg)  ? ? ?Physical Exam ?Vitals reviewed.  ?HENT:  ?   Head: Normocephalic and atraumatic.  ?Eyes:  ?   Pupils: Pupils are equal, round, and reactive to light.  ?Cardiovascular:  ?   Rate and Rhythm: Normal rate and regular rhythm.  ?   Heart sounds: Normal heart sounds.  ?Pulmonary:  ?   Effort:  Pulmonary effort is normal.  ?   Breath sounds: Normal breath sounds.  ?Abdominal:  ?   General: Bowel sounds are normal.  ?   Palpations: Abdomen is soft.  ?Musculoskeletal:     ?   General: No tenderness or deformity. Normal range of motion.  ?   Cervical back: Normal range of motion.  ?Lymphadenopathy:  ?   Cervical: No cervical adenopathy.  ?Skin: ?   General: Skin is warm and dry.  ?   Findings: No erythema or rash.  ?Neurological:  ?   Mental Status: He is alert and oriented to person, place, and time.  ?Psychiatric:     ?   Behavior: Behavior normal.     ?   Thought Content: Thought content normal.     ?   Judgment: Judgment normal.  ? ? ? ?Lab Results  ?Component Value Date  ? WBC 11.7 (H) 07/16/2021  ? HGB 13.6 07/16/2021  ? HCT 44.9 07/16/2021  ? MCV 86.5 07/16/2021  ? PLT 475 (H) 07/16/2021  ? ?  Chemistry   ?   ?Component Value Date/Time  ? NA 138 06/22/2021 1130  ? NA 141 05/23/2019 1542  ? K 5.1 06/22/2021 1130  ? CL 97 (L) 06/22/2021 1130  ? CO2 31 06/22/2021 1130  ? BUN 24 (H) 06/22/2021 1130  ? BUN 16 05/23/2019 1542  ? CREATININE 1.13 06/22/2021 1130  ?    ?Component Value Date/Time  ? CALCIUM 11.6 (H) 06/22/2021 1130  ? ALKPHOS 122 06/22/2021 1130  ? AST 12 (L) 06/22/2021 1130  ? ALT 15 06/22/2021 1130  ? BILITOT 0.4 06/22/2021 1130  ?  ? ?Impression and Plan: ?Lucas Burke is a very nice 61 year old white male.  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 ? ?He presented with a large pleural effusion and empyema.  They needed to have surgery to help clear this out.  He had biopsies that were taken.  He had a chest tube in place for a while. ? ?We will get the immunotherapy started today.  Maybe, we will have some success with immunotherapy. ? ?Again the high calcium is a little bit troublesome.  He will get Zometa today. ? ?I just hope that he does respond.  I would like to see him again in 2 weeks.  He will get his nivolumab in 2 weeks.  We will see what his  calcium level is at that time.  ? ?I does want his quality of life to be better so that he can enjoy his children.  He has 2 small children.   ? ?Volanda Napoleon, MD ?5/12/202310:34 AM  ?

## 2021-07-16 NOTE — Telephone Encounter (Signed)
Refill request for Oxycodone 5 mg immediate release. Last refilled on 06/22/21, last visit 07/16/21. ?

## 2021-07-17 LAB — T4: T4, Total: 7.6 ug/dL (ref 4.5–12.0)

## 2021-07-20 ENCOUNTER — Encounter: Payer: Self-pay | Admitting: *Deleted

## 2021-07-20 ENCOUNTER — Encounter: Payer: Self-pay | Admitting: Hematology & Oncology

## 2021-07-20 NOTE — Progress Notes (Signed)
After multiple no shows patient here to today to begin treatment. He is anxious but says he is ready to begin. Chemo education provided by infusion RN. Once treatment started patient does appear more relaxed. I will follow up with patient next week. ? ?Oncology Nurse Navigator Documentation ? ? ?  07/16/2021  ? 10:30 AM  ?Oncology Nurse Navigator Flowsheets  ?Planned Course of Treatment Chemotherapy  ?Phase of Treatment Chemo  ?Chemotherapy Actual Start Date: 07/16/2021  ?Chemotherapy Expected End Date: 12/17/2021  ?Navigator Follow Up Date: 07/20/2021  ?Navigator Follow Up Reason: Patient Call  ?Navigator Location [facility]  ?Navigator Encounter Type Treatment;Appt/Treatment Plan Review  ?Patient Visit Type MedOnc  ?Treatment Phase First Chemo Tx  ?Barriers/Navigation Needs Coordination of Care;Education;Language/Communication;Personal Conflicts  ?Interventions Psycho-Social Support  ?Acuity Level 2-Minimal Needs (1-2 Barriers Identified)  ?Support Groups/Services Friends and Family  ?Time Spent with Patient 15  ?  ?

## 2021-07-20 NOTE — Progress Notes (Signed)
0945: Called patient to follow up after his first treatment on Friday. No contact made. Message left requesting call back.  ? ?1530: Called patient. No contact made. Left message asking for a call back, but also included information on when to call the office and encouraged patient to call if he developed any side effects or had any questions.  ? ?Oncology Nurse Navigator Documentation ? ? ?  07/20/2021  ?  3:30 PM  ?Oncology Nurse Navigator Flowsheets  ?Navigator Follow Up Date: 07/30/2021  ?Navigator Follow Up Reason: Follow-up Appointment;Chemotherapy  ?Navigator Location [facility]  ?Navigator Encounter Type Telephone  ?Telephone Outgoing Call  ?Patient Visit Type MedOnc  ?Treatment Phase Active Tx  ?Barriers/Navigation Needs Coordination of Care;Education;Language/Communication;Personal Conflicts  ?Education Pain/ Symptom Management  ?Interventions Education  ?Acuity Level 2-Minimal Needs (1-2 Barriers Identified)  ?Education Method Verbal  ?Support Groups/Services Friends and Family  ?Time Spent with Patient 15  ?  ?

## 2021-07-30 ENCOUNTER — Inpatient Hospital Stay: Payer: Medicaid Other | Admitting: Hematology & Oncology

## 2021-07-30 ENCOUNTER — Inpatient Hospital Stay: Payer: Medicaid Other

## 2021-08-03 ENCOUNTER — Encounter: Payer: Self-pay | Admitting: *Deleted

## 2021-08-03 NOTE — Progress Notes (Signed)
Patient was a no-show for his follow up appointment and cycle 2 of treatment.   Called at [facility] and was unable to make contact. Voicemail left for patient requesting call back.   Called patient's daughter, [name]. She states patient didn't know about his appointment. She is okay to reschedule on patient's behalf. Message sent to scheduling to call [name] to reschedule appointment.   Oncology Nurse Navigator Documentation     08/03/2021    8:45 AM  Oncology Nurse Navigator Flowsheets  Navigator Follow Up Date: 08/04/2021  Navigator Follow Up Reason: Follow-up Appointment;Chemotherapy  Navigator [employer] manager, [employer] Encounter Type Telephone  Telephone Outgoing Call  Patient Visit Type MedOnc  Treatment Phase Active Tx  Barriers/Navigation Needs Coordination of Care;Education;Language/Communication;Personal Conflicts  Interventions Coordination of Care  Acuity Level 4-High Needs (Greater Than 4 Barriers Identified)  Support Groups/Services Friends and Family  Time Spent with Patient 15

## 2021-08-04 ENCOUNTER — Inpatient Hospital Stay: Payer: Medicaid Other | Admitting: Family

## 2021-08-04 ENCOUNTER — Inpatient Hospital Stay: Payer: Medicaid Other

## 2021-08-05 ENCOUNTER — Encounter: Payer: Self-pay | Admitting: *Deleted

## 2021-08-05 NOTE — Progress Notes (Signed)
Patient once again didn't come to his appointment yesterday. [name] called yesterday to reschedule patient's appointment.   Oncology Nurse Navigator Documentation     08/05/2021    8:45 AM  Oncology Nurse Navigator Flowsheets  Navigator Follow Up Date: 08/09/2021  Navigator Follow Up Reason: Follow-up Appointment;Chemotherapy  Navigator Location [facility]  Navigator Encounter Type Appt/Treatment Plan Review  Patient Visit Type MedOnc  Treatment Phase Active Tx  Barriers/Navigation Needs Coordination of Care;Education;Language/Communication;Personal Conflicts  Interventions None Required  Acuity Level 4-High Needs (Greater Than 4 Barriers Identified)  Support Groups/Services Friends and Family  Time Spent with Patient 15

## 2021-08-09 ENCOUNTER — Inpatient Hospital Stay (HOSPITAL_BASED_OUTPATIENT_CLINIC_OR_DEPARTMENT_OTHER): Payer: Medicaid Other | Admitting: Family

## 2021-08-09 ENCOUNTER — Other Ambulatory Visit: Payer: Self-pay | Admitting: Family

## 2021-08-09 ENCOUNTER — Other Ambulatory Visit: Payer: Self-pay | Admitting: *Deleted

## 2021-08-09 ENCOUNTER — Other Ambulatory Visit (HOSPITAL_BASED_OUTPATIENT_CLINIC_OR_DEPARTMENT_OTHER): Payer: Self-pay

## 2021-08-09 ENCOUNTER — Inpatient Hospital Stay: Payer: Medicaid Other

## 2021-08-09 ENCOUNTER — Encounter: Payer: Self-pay | Admitting: Hematology & Oncology

## 2021-08-09 ENCOUNTER — Other Ambulatory Visit: Payer: Self-pay

## 2021-08-09 ENCOUNTER — Inpatient Hospital Stay: Payer: Medicaid Other | Attending: Hematology & Oncology

## 2021-08-09 ENCOUNTER — Encounter: Payer: Self-pay | Admitting: Family

## 2021-08-09 ENCOUNTER — Encounter: Payer: Self-pay | Admitting: *Deleted

## 2021-08-09 VITALS — BP 112/69 | HR 92 | Temp 97.7°F | Resp 19 | Ht 72.0 in | Wt 158.0 lb

## 2021-08-09 DIAGNOSIS — C3492 Malignant neoplasm of unspecified part of left bronchus or lung: Secondary | ICD-10-CM | POA: Diagnosis present

## 2021-08-09 DIAGNOSIS — Z5112 Encounter for antineoplastic immunotherapy: Secondary | ICD-10-CM | POA: Insufficient documentation

## 2021-08-09 DIAGNOSIS — Z79899 Other long term (current) drug therapy: Secondary | ICD-10-CM | POA: Diagnosis not present

## 2021-08-09 DIAGNOSIS — E032 Hypothyroidism due to medicaments and other exogenous substances: Secondary | ICD-10-CM

## 2021-08-09 DIAGNOSIS — J90 Pleural effusion, not elsewhere classified: Secondary | ICD-10-CM

## 2021-08-09 LAB — CMP (CANCER CENTER ONLY)
ALT: 8 U/L (ref 0–44)
AST: 11 U/L — ABNORMAL LOW (ref 15–41)
Albumin: 3.8 g/dL (ref 3.5–5.0)
Alkaline Phosphatase: 94 U/L (ref 38–126)
Anion gap: 8 (ref 5–15)
BUN: 24 mg/dL — ABNORMAL HIGH (ref 8–23)
CO2: 29 mmol/L (ref 22–32)
Calcium: 12.2 mg/dL — ABNORMAL HIGH (ref 8.9–10.3)
Chloride: 99 mmol/L (ref 98–111)
Creatinine: 1.31 mg/dL — ABNORMAL HIGH (ref 0.61–1.24)
GFR, Estimated: >60 mL/min (ref >60)
Glucose, Bld: 103 mg/dL — ABNORMAL HIGH (ref 70–99)
Potassium: 4.8 mmol/L (ref 3.5–5.1)
Sodium: 136 mmol/L (ref 135–145)
Total Bilirubin: 0.4 mg/dL (ref 0.3–1.2)
Total Protein: 8.2 g/dL — ABNORMAL HIGH (ref 6.5–8.1)

## 2021-08-09 LAB — CBC WITH DIFFERENTIAL (CANCER CENTER ONLY)
Abs Immature Granulocytes: 0.09 10*3/uL — ABNORMAL HIGH (ref 0.00–0.07)
Basophils Absolute: 0.1 10*3/uL (ref 0.0–0.1)
Basophils Relative: 1 %
Eosinophils Absolute: 0.4 10*3/uL (ref 0.0–0.5)
Eosinophils Relative: 3 %
HCT: 42.3 % (ref 39.0–52.0)
Hemoglobin: 13 g/dL (ref 13.0–17.0)
Immature Granulocytes: 1 %
Lymphocytes Relative: 14 %
Lymphs Abs: 2.3 10*3/uL (ref 0.7–4.0)
MCH: 26.3 pg (ref 26.0–34.0)
MCHC: 30.7 g/dL (ref 30.0–36.0)
MCV: 85.5 fL (ref 80.0–100.0)
Monocytes Absolute: 1.9 10*3/uL — ABNORMAL HIGH (ref 0.1–1.0)
Monocytes Relative: 12 %
Neutro Abs: 11.4 10*3/uL — ABNORMAL HIGH (ref 1.7–7.7)
Neutrophils Relative %: 69 %
Platelet Count: 458 10*3/uL — ABNORMAL HIGH (ref 150–400)
RBC: 4.95 MIL/uL (ref 4.22–5.81)
RDW: 15.4 % (ref 11.5–15.5)
WBC Count: 16.2 10*3/uL — ABNORMAL HIGH (ref 4.0–10.5)
nRBC: 0 % (ref 0.0–0.2)

## 2021-08-09 LAB — LACTATE DEHYDROGENASE: LDH: 152 U/L (ref 98–192)

## 2021-08-09 LAB — TSH: TSH: 2.977 u[IU]/mL (ref 0.350–4.500)

## 2021-08-09 MED ORDER — SODIUM CHLORIDE 0.9 % IV SOLN
200.0000 mg | Freq: Once | INTRAVENOUS | Status: AC
  Administered 2021-08-09: 200 mg via INTRAVENOUS
  Filled 2021-08-09: qty 20

## 2021-08-09 MED ORDER — SODIUM CHLORIDE 0.9 % IV SOLN: Freq: Once | INTRAVENOUS | Status: DC

## 2021-08-09 MED ORDER — NAPROXEN 500 MG PO TABS
500.0000 mg | ORAL_TABLET | Freq: Two times a day (BID) | ORAL | 0 refills | Status: DC
  Filled 2021-08-09: qty 60, 30d supply, fill #0

## 2021-08-09 MED ORDER — NAPROXEN SODIUM 550 MG PO TABS
550.0000 mg | ORAL_TABLET | Freq: Two times a day (BID) | ORAL | 0 refills | Status: DC
  Filled 2021-08-09: qty 60, 30d supply, fill #0

## 2021-08-09 MED ORDER — OXYCODONE HCL 5 MG PO TABS
5.0000 mg | ORAL_TABLET | ORAL | 0 refills | Status: DC | PRN
  Filled 2021-08-09: qty 90, 15d supply, fill #0

## 2021-08-09 MED ORDER — SODIUM CHLORIDE 0.9 % IV SOLN: Freq: Once | INTRAVENOUS | Status: AC

## 2021-08-09 MED ORDER — ZOLEDRONIC ACID 4 MG/100ML IV SOLN
4.0000 mg | Freq: Once | INTRAVENOUS | Status: AC
  Administered 2021-08-09: 4 mg via INTRAVENOUS
  Filled 2021-08-09: qty 100

## 2021-08-09 NOTE — Addendum Note (Signed)
Addended by: Theadore Nan on: 08/09/2021 01:45 PM   Modules accepted: Orders

## 2021-08-09 NOTE — Progress Notes (Signed)
Patient is here for his second cycle after two missed appointments. He has stated at times that he wasn't aware of when his next appointments are. We will schedule his appointments and give him a calendar in infusion today.   He will also need transportation assistance for upcoming appointments. Patient given the transportation number to call and enroll in transportation.   Calendar and transportation number provided by infusion RN.   Oncology Nurse Navigator Documentation     08/09/2021    2:15 PM  Oncology Nurse Navigator Flowsheets  Navigator Follow Up Date: 08/23/2021  Navigator Follow Up Reason: Follow-up Appointment;Chemotherapy  Navigator Location [facility]  Navigator Encounter Type Appt/Treatment Plan Review;Treatment  Patient Visit Type MedOnc  Treatment Phase Active Tx  Barriers/Navigation Needs Coordination of Care;Education;Language/Communication;Personal Conflicts  Interventions Coordination of Care;Education;Psycho-Social Support  Acuity Level 4-High Needs (Greater Than 4 Barriers Identified)  Coordination of Care Appts  Education Method Verbal;Written  Support Groups/Services Friends and Family  Time Spent with Patient 30

## 2021-08-09 NOTE — Progress Notes (Addendum)
Hematology and Oncology Follow Up Visit  RACHARD ISIDRO [id number] Jul 09, 1960 61 y.o. 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 Pickerill is here today for follow-up and treatment. He is under a good deal of stress at home raising his 3 children.  He has been drinking 4 Ensure a day and eating when he can. He is on food stamps and makes sure the children eat first.  He feels fatigued.  He does feel that he is staying well hydrated throughout the day. His weight is stable at 158 lbs.  He continues to have pain in the mid abdomen that radiates in the to right side since having his chest tube removed. He states that he tried taking Naproxen 550 mg and this resolved his pain. I spoke with Dr. Marin Olp and we will give him a prescription for this to take BID as needed with food.  His SOB with exertion is unchanged from baseline.  No blood loss noted. No bruising or petechiae.  He has occasional nausea when lying flat. No vomiting.  No fever, chills, cough, rash, dizziness, chest pain, palpitations or changes in bowel or bladder habits.  No swelling, tenderness, numbness or tingling in his extremities.  No falls or syncope to report.   ECOG Performance Status: 1 - Symptomatic but completely ambulatory  Medications:  Allergies as of 08/09/2021       Reactions   Codeine Other (See Comments)   Critical        Medication List        Accurate as of August 09, 2021 12:37 PM. If you have any questions, ask your nurse or doctor.          acetaminophen 325 MG tablet Commonly known as: TYLENOL Take 650 mg by mouth every 6 (six) hours as needed.   ALPRAZolam 0.25 MG tablet Commonly known as: XANAX Take 1 tablet (0.25 mg total) by mouth 3 (three) times daily as needed  for anxiety.   alum & mag hydroxide-simeth 200-200-20 MG/5ML suspension Commonly known as: MAALOX/MYLANTA Take 30 mLs by mouth every 4 (four) hours as needed for indigestion.   feeding supplement Liqd Take 237 mLs by mouth 3 (three) times daily between meals.   midodrine 5 MG tablet Commonly known as: PROAMATINE Take 1 tablet (5 mg total) by mouth 3 (three) times daily with meals.   multivitamin with minerals Tabs tablet Take 1 tablet by mouth daily.   ondansetron 4 MG tablet Commonly known as: ZOFRAN Take 1 tablet (4 mg total) by mouth every 6 (six) hours as needed for nausea.   oxyCODONE 5 MG immediate release tablet Commonly known as: Oxy IR/ROXICODONE Take 1 tablet (5 mg total) by mouth every 4 (four) hours as needed for severe pain.   pantoprazole 40 MG tablet Commonly known as: PROTONIX Take 1 tablet (40 mg total) by mouth daily.   PARoxetine 20 MG tablet Commonly known as: PAXIL Take 1 tablet by mouth daily.        Allergies:  Allergies  Allergen Reactions   Codeine Other (See Comments)    Critical    Past Medical History, Surgical history, Social history, and Family History were reviewed and updated.  Review of Systems: All other 10 point review  of systems is negative.   Physical Exam:  vitals were not taken for this visit.   Wt Readings from Last 3 Encounters:  07/16/21 147 lb (66.7 kg)  06/22/21 157 lb (71.2 kg)  05/31/21 167 lb 8.8 oz (76 kg)    Ocular: Sclerae unicteric, pupils equal, round and reactive to light Ear-nose-throat: Oropharynx clear, dentition fair Lymphatic: No cervical or supraclavicular adenopathy Lungs no rales or rhonchi, good excursion bilaterally Heart regular rate and rhythm, no murmur appreciated Abd soft, nontender, positive bowel sounds MSK no focal spinal tenderness, no joint edema Neuro: non-focal, well-oriented, appropriate affect Breasts: Deferred   Lab Results  Component Value Date   WBC 16.2 (H) 08/09/2021    HGB 13.0 08/09/2021   HCT 42.3 08/09/2021   MCV 85.5 08/09/2021   PLT 458 (H) 08/09/2021   Lab Results  Component Value Date   FERRITIN 637 (H) 05/25/2021   IRON 14 (L) 05/25/2021   TIBC 115 (L) 05/25/2021   UIBC 101 05/25/2021   IRONPCTSAT 12 (L) 05/25/2021   Lab Results  Component Value Date   RBC 4.95 08/09/2021   No results found for: KPAFRELGTCHN, LAMBDASER, KAPLAMBRATIO No results found for: IGGSERUM, IGA, IGMSERUM No results found for: Odetta Pink, SPEI   Chemistry      Component Value Date/Time   NA 136 08/09/2021 1149   NA 141 05/23/2019 1542   K 4.8 08/09/2021 1149   CL 99 08/09/2021 1149   CO2 29 08/09/2021 1149   BUN 24 (H) 08/09/2021 1149   BUN 16 05/23/2019 1542   CREATININE 1.31 (H) 08/09/2021 1149      Component Value Date/Time   CALCIUM 12.2 (H) 08/09/2021 1149   ALKPHOS 94 08/09/2021 1149   AST 11 (L) 08/09/2021 1149   ALT 8 08/09/2021 1149   BILITOT 0.4 08/09/2021 1149       Impression and Plan: Mr. 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  He will try Naproxen for his abdomen, right side and mid back pain. He understands that he needs to take with food to prevent GI upset.  We will proceed with treatment today as planned per MD. He will also receive Zometa for the elevated calcium level of 12.2.  TSH/T4 pending.  We will get him set up for [facility] transportation to help him get to and from appointments.  Follow-up and treatment in 2 weeks.   Lottie Dawson, NP 6/5/202312:37 PM

## 2021-08-09 NOTE — Progress Notes (Signed)
Current calendar physically handed to pt by Jarrett Soho, RN to pt.  Pt verbalized he understood he will return on 6/19 for lab/provider/treatment.  Pt denied any concerns at this time.

## 2021-08-09 NOTE — Patient Instructions (Signed)
Nivolumab injection What is this medication? NIVOLUMAB (nye VOL ue mab) is a monoclonal antibody. It treats certain types of cancer. Some of the cancers treated are colon cancer, head and neck cancer, Hodgkin lymphoma, lung cancer, and melanoma. This medicine may be used for other purposes; ask your health care provider or pharmacist if you have questions. COMMON BRAND NAME(S): Opdivo What should I tell my care team before I take this medication? They need to know if you have any of these conditions: Autoimmune diseases such as Crohn's disease, ulcerative colitis, or lupus Have had or planning to have an allogeneic stem cell transplant (uses someone else's stem cells) History of chest radiation Organ transplant Nervous system problems such as myasthenia gravis or Guillain-Barre syndrome An unusual or allergic reaction to nivolumab, other medicines, foods, dyes, or preservatives Pregnant or trying to get pregnant Breast-feeding How should I use this medication? This medication is injected into a vein. It is given in a hospital or clinic setting. A special MedGuide will be given to you before each treatment. Be sure to read this information carefully each time. Talk to your care team regarding the use of this medication in children. While it may be prescribed for children as young as 12 years for selected conditions, precautions do apply. Overdosage: If you think you have taken too much of this medicine contact a poison control center or emergency room at once. NOTE: This medicine is only for you. Do not share this medicine with others. What if I miss a dose? Keep appointments for follow-up doses. It is important not to miss your dose. Call your care team if you are unable to keep an appointment. What may interact with this medication? Interactions have not been studied. This list may not describe all possible interactions. Give your health care provider a list of all the medicines, herbs,  non-prescription drugs, or dietary supplements you use. Also tell them if you smoke, drink alcohol, or use illegal drugs. Some items may interact with your medicine. What should I watch for while using this medication? Your condition will be monitored carefully while you are receiving this medication. You may need blood work done while you are taking this medication. Do not become pregnant while taking this medication or for 5 months after stopping it. Women should inform their care team if they wish to become pregnant or think they might be pregnant. There is a potential for serious harm to an unborn child. Talk to your care team for more information. Do not breast-feed an infant while taking this medication or for 5 months after stopping it. What side effects may I notice from receiving this medication? Side effects that you should report to your care team as soon as possible: Allergic reactions--skin rash, itching, hives, swelling of the face, lips, tongue, or throat Bloody or black, tar-like stools Change in vision Chest pain Diarrhea Dry cough, shortness of breath or trouble breathing Eye pain Fast or irregular heartbeat Fever, chills High blood sugar (hyperglycemia)--increased thirst or amount of urine, unusual weakness or fatigue, blurry vision High thyroid levels (hyperthyroidism)--fast or irregular heartbeat, weight loss, excessive sweating or sensitivity to heat, tremors or shaking, anxiety, nervousness, irregular menstrual cycle or spotting Kidney injury--decrease in the amount of urine, swelling of the ankles, hands, or feet Liver injury--right upper belly pain, loss of appetite, nausea, light-colored stool, dark yellow or brown urine, yellowing skin or eyes, unusual weakness or fatigue Low red blood cell count--unusual weakness or fatigue, dizziness, headache, trouble breathing  Low thyroid levels (hypothyroidism)--unusual weakness or fatigue, increased sensitivity to cold,  constipation, hair loss, dry skin, weight gain, feelings of depression Mood and behavior changes-confusion, change in sex drive or performance, irritability Muscle pain or cramps Pain, tingling, or numbness in the hands or feet, muscle weakness, trouble walking, loss of balance or coordination Red or dark brown urine Redness, blistering, peeling, or loosening of the skin, including inside the mouth Stomach pain Unusual bruising or bleeding Side effects that usually do not require medical attention (report to your care team if they continue or are bothersome): Bone pain Constipation Loss of appetite Nausea Tiredness Vomiting This list may not describe all possible side effects. Call your doctor for medical advice about side effects. You may report side effects to FDA at 1-800-FDA-1088. Where should I keep my medication? This medication is given in a hospital or clinic and will not be stored at home. NOTE: This sheet is a summary. It may not cover all possible information. If you have questions about this medicine, talk to your doctor, pharmacist, or health care provider.  2023 Elsevier/Gold Standard (2021-01-22 00:00:00)      Zoledronic Acid Injection (Cancer) What is this medication? ZOLEDRONIC ACID (ZOE le dron ik AS id) treats high calcium levels in the blood caused by cancer. It may also be used with chemotherapy to treat weakened bones caused by cancer. It works by slowing down the release of calcium from bones. This lowers calcium levels in your blood. It also makes your bones stronger and less likely to break (fracture). It belongs to a group of medications called bisphosphonates. This medicine may be used for other purposes; ask your health care provider or pharmacist if you have questions. COMMON BRAND NAME(S): Zometa, Zometa Powder What should I tell my care team before I take this medication? They need to know if you have any of these conditions: Dehydration Dental  disease Kidney disease Liver disease Low levels of calcium in the blood Lung or breathing disease, such as asthma Receiving steroids, such as dexamethasone or prednisone An unusual or allergic reaction to zoledronic acid, other medications, foods, dyes, or preservatives Pregnant or trying to get pregnant Breast-feeding How should I use this medication? This medication is injected into a vein. It is given by your care team in a hospital or clinic setting. Talk to your care team about the use of this medication in children. Special care may be needed. Overdosage: If you think you have taken too much of this medicine contact a poison control center or emergency room at once. NOTE: This medicine is only for you. Do not share this medicine with others. What if I miss a dose? Keep appointments for follow-up doses. It is important not to miss your dose. Call your care team if you are unable to keep an appointment. What may interact with this medication? Certain antibiotics given by injection Diuretics, such as bumetanide, furosemide NSAIDs, medications for pain and inflammation, such as ibuprofen or naproxen Teriparatide Thalidomide This list may not describe all possible interactions. Give your health care provider a list of all the medicines, herbs, non-prescription drugs, or dietary supplements you use. Also tell them if you smoke, drink alcohol, or use illegal drugs. Some items may interact with your medicine. What should I watch for while using this medication? Visit your care team for regular checks on your progress. It may be some time before you see the benefit from this medication. Some people who take this medication have severe bone,  joint, or muscle pain. This medication may also increase your risk for jaw problems or a broken thigh bone. Tell your care team right away if you have severe pain in your jaw, bones, joints, or muscles. Tell you care team if you have any pain that does not  go away or that gets worse. Tell your dentist and dental surgeon that you are taking this medication. You should not have major dental surgery while on this medication. See your dentist to have a dental exam and fix any dental problems before starting this medication. Take good care of your teeth while on this medication. Make sure you see your dentist for regular follow-up appointments. You should make sure you get enough calcium and vitamin D while you are taking this medication. Discuss the foods you eat and the vitamins you take with your care team. Check with your care team if you have severe diarrhea, nausea, and vomiting, or if you sweat a lot. The loss of too much body fluid may make it dangerous for you to take this medication. You may need bloodwork while taking this medication. Talk to your care team if you wish to become pregnant or think you might be pregnant. This medication can cause serious birth defects. What side effects may I notice from receiving this medication? Side effects that you should report to your care team as soon as possible: Allergic reactions--skin rash, itching, hives, swelling of the face, lips, tongue, or throat Kidney injury--decrease in the amount of urine, swelling of the ankles, hands, or feet Low calcium level--muscle pain or cramps, confusion, tingling, or numbness in the hands or feet Osteonecrosis of the jaw--pain, swelling, or redness in the mouth, numbness of the jaw, poor healing after dental work, unusual discharge from the mouth, visible bones in the mouth Severe bone, joint, or muscle pain Side effects that usually do not require medical attention (report to your care team if they continue or are bothersome): Constipation Fatigue Fever Loss of appetite Nausea Stomach pain This list may not describe all possible side effects. Call your doctor for medical advice about side effects. You may report side effects to FDA at 1-800-FDA-1088. Where should  I keep my medication? This medication is given in a hospital or clinic. It will not be stored at home. NOTE: This sheet is a summary. It may not cover all possible information. If you have questions about this medicine, talk to your doctor, pharmacist, or health care provider.  2023 Elsevier/Gold Standard (2021-04-16 00:00:00)

## 2021-08-10 ENCOUNTER — Encounter (HOSPITAL_COMMUNITY): Payer: Self-pay

## 2021-08-10 LAB — T4: T4, Total: 7.6 ug/dL (ref 4.5–12.0)

## 2021-08-10 NOTE — Progress Notes (Signed)
Prior authorization for Naproxen Sodium 550 mg sent to [facility] via CoverMyMeds.  PA approved from 08/09/2021-08/10/2022.

## 2021-08-12 ENCOUNTER — Telehealth: Payer: Self-pay | Admitting: *Deleted

## 2021-08-12 NOTE — Telephone Encounter (Signed)
Left message on cell phone that the medicine he received this week was Zometa to help bring down the calcium level. Instructed him to call the office if he had any other questions or concerns.

## 2021-08-23 ENCOUNTER — Inpatient Hospital Stay: Payer: Medicaid Other

## 2021-08-23 ENCOUNTER — Inpatient Hospital Stay (HOSPITAL_BASED_OUTPATIENT_CLINIC_OR_DEPARTMENT_OTHER): Payer: Medicaid Other | Admitting: Family

## 2021-08-23 ENCOUNTER — Other Ambulatory Visit: Payer: Self-pay | Admitting: Lab

## 2021-08-23 VITALS — BP 103/64 | HR 65 | Temp 97.7°F | Resp 18 | Wt 157.1 lb

## 2021-08-23 DIAGNOSIS — E032 Hypothyroidism due to medicaments and other exogenous substances: Secondary | ICD-10-CM | POA: Diagnosis not present

## 2021-08-23 DIAGNOSIS — C3492 Malignant neoplasm of unspecified part of left bronchus or lung: Secondary | ICD-10-CM

## 2021-08-23 DIAGNOSIS — Z5112 Encounter for antineoplastic immunotherapy: Secondary | ICD-10-CM | POA: Diagnosis not present

## 2021-08-23 DIAGNOSIS — M792 Neuralgia and neuritis, unspecified: Secondary | ICD-10-CM

## 2021-08-23 DIAGNOSIS — K5900 Constipation, unspecified: Secondary | ICD-10-CM

## 2021-08-23 LAB — CMP (CANCER CENTER ONLY)
ALT: 9 U/L (ref 0–44)
AST: 12 U/L — ABNORMAL LOW (ref 15–41)
Albumin: 3.8 g/dL (ref 3.5–5.0)
Alkaline Phosphatase: 87 U/L (ref 38–126)
Anion gap: 7 (ref 5–15)
BUN: 30 mg/dL — ABNORMAL HIGH (ref 8–23)
CO2: 29 mmol/L (ref 22–32)
Calcium: 10.9 mg/dL — ABNORMAL HIGH (ref 8.9–10.3)
Chloride: 102 mmol/L (ref 98–111)
Creatinine: 1.27 mg/dL — ABNORMAL HIGH (ref 0.61–1.24)
GFR, Estimated: >60 mL/min (ref >60)
Glucose, Bld: 96 mg/dL (ref 70–99)
Potassium: 4.5 mmol/L (ref 3.5–5.1)
Sodium: 138 mmol/L (ref 135–145)
Total Bilirubin: 0.3 mg/dL (ref 0.3–1.2)
Total Protein: 8 g/dL (ref 6.5–8.1)

## 2021-08-23 LAB — CBC WITH DIFFERENTIAL (CANCER CENTER ONLY)
Abs Immature Granulocytes: 0.03 10*3/uL (ref 0.00–0.07)
Basophils Absolute: 0.1 10*3/uL (ref 0.0–0.1)
Basophils Relative: 1 %
Eosinophils Absolute: 0.5 10*3/uL (ref 0.0–0.5)
Eosinophils Relative: 4 %
HCT: 40.2 % (ref 39.0–52.0)
Hemoglobin: 12.3 g/dL — ABNORMAL LOW (ref 13.0–17.0)
Immature Granulocytes: 0 %
Lymphocytes Relative: 16 %
Lymphs Abs: 2 10*3/uL (ref 0.7–4.0)
MCH: 26.3 pg (ref 26.0–34.0)
MCHC: 30.6 g/dL (ref 30.0–36.0)
MCV: 85.9 fL (ref 80.0–100.0)
Monocytes Absolute: 1.6 10*3/uL — ABNORMAL HIGH (ref 0.1–1.0)
Monocytes Relative: 13 %
Neutro Abs: 8.1 10*3/uL — ABNORMAL HIGH (ref 1.7–7.7)
Neutrophils Relative %: 66 %
Platelet Count: 360 10*3/uL (ref 150–400)
RBC: 4.68 MIL/uL (ref 4.22–5.81)
RDW: 14.6 % (ref 11.5–15.5)
WBC Count: 12.4 10*3/uL — ABNORMAL HIGH (ref 4.0–10.5)
nRBC: 0 % (ref 0.0–0.2)

## 2021-08-23 LAB — TSH: TSH: 2.236 u[IU]/mL (ref 0.350–4.500)

## 2021-08-23 LAB — LACTATE DEHYDROGENASE: LDH: 173 U/L (ref 98–192)

## 2021-08-23 MED ORDER — POLYETHYLENE GLYCOL 3350 17 G PO PACK: 17.0000 g | PACK | Freq: Every day | ORAL | 6 refills | Status: DC

## 2021-08-23 MED ORDER — SODIUM CHLORIDE 0.9 % IV SOLN: Freq: Once | INTRAVENOUS | Status: AC

## 2021-08-23 MED ORDER — GABAPENTIN 300 MG PO CAPS: 300.0000 mg | ORAL_CAPSULE | Freq: Every day | ORAL | 0 refills | Status: DC

## 2021-08-23 MED ORDER — SODIUM CHLORIDE 0.9 % IV SOLN
200.0000 mg | Freq: Once | INTRAVENOUS | Status: AC
  Administered 2021-08-23: 200 mg via INTRAVENOUS
  Filled 2021-08-23: qty 20

## 2021-08-23 NOTE — Patient Instructions (Signed)
[facility]  Discharge Instructions: Thank you for choosing [facility] to provide your oncology and hematology care.   If you have a lab appointment with the [facility], please go directly to the [facility] and check in at the registration area.  Wear comfortable clothing and clothing appropriate for easy access to any Portacath or PICC line.   We strive to give you quality time with your provider. You may need to reschedule your appointment if you arrive late (15 or more minutes).  Arriving late affects you and other patients whose appointments are after yours.  Also, if you miss three or more appointments without notifying the office, you may be dismissed from the clinic at the provider's discretion.      For prescription refill requests, have your pharmacy contact our office and allow 72 hours for refills to be completed.    Today you received the following chemotherapy and/or immunotherapy agents Opdivo       To help prevent nausea and vomiting after your treatment, we encourage you to take your nausea medication as directed.  BELOW ARE SYMPTOMS THAT SHOULD BE REPORTED IMMEDIATELY: *FEVER GREATER THAN 100.4 F (38 C) OR HIGHER *CHILLS OR SWEATING *NAUSEA AND VOMITING THAT IS NOT CONTROLLED WITH YOUR NAUSEA MEDICATION *UNUSUAL SHORTNESS OF BREATH *UNUSUAL BRUISING OR BLEEDING *URINARY PROBLEMS (pain or burning when urinating, or frequent urination) *BOWEL PROBLEMS (unusual diarrhea, constipation, pain near the anus) TENDERNESS IN MOUTH AND THROAT WITH OR WITHOUT PRESENCE OF ULCERS (sore throat, sores in mouth, or a toothache) UNUSUAL RASH, SWELLING OR PAIN  UNUSUAL VAGINAL DISCHARGE OR ITCHING   Items with * indicate a potential emergency and should be followed up as soon as possible or go to the Emergency Department if any problems should occur.  Please show the CHEMOTHERAPY ALERT CARD or IMMUNOTHERAPY ALERT CARD at check-in to the  Emergency Department and triage nurse. Should you have questions after your visit or need to cancel or reschedule your appointment, please contact [facility]  [phone] and follow the prompts.  Office hours are 8:00 a.m. to 4:30 p.m. Monday - Friday. Please note that voicemails left after 4:00 p.m. may not be returned until the following business day.  We are closed weekends and major holidays. You have access to a nurse at all times for urgent questions. Please call the main number to the clinic [phone] and follow the prompts.  For any non-urgent questions, you may also contact your provider using MyChart. We now offer e-Visits for anyone 63 and older to request care online for non-urgent symptoms. For details visit [email or web address].   Also download the MyChart app! Go to the app store, search "MyChart", open the app, select [facility], and log in with your MyChart username and password.  Masks are optional in the cancer centers. If you would like for your care team to wear a mask while they are taking care of you, please let them know. For doctor visits, patients may have with them one support person who is at least 63 years old. At this time, visitors are not allowed in the infusion area.

## 2021-08-23 NOTE — Progress Notes (Signed)
Hematology and Oncology Follow Up Visit  Lucas Burke [id number] Aug 02, 1960 61 y.o. 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 is here today for follow-up and treatment. He is doing well and his pain in the abdomen is much better with Naproxen. His energy also seems to have improved.  He has occasional SOB with over exertion and will take a break to rest when needed.  He is still having nerve pain at his old left chest tube site when trying to sleep on his sides at night.  No fever, chills, n/v, cough, rash, dizziness, chest pain, palpitations, abdominal pain or changes in bowel or bladder habits.  He needs a script for Miralax to prevent constipation.  No blood loss noted. No bruising or petechiae.  No swelling, numbness or tingling in his extremities.  No falls or syncope.  Appetite is getting better. He is still drinking Ensure daily. Hydration is good and his weight is 157 lbs.   ECOG Performance Status: 1 - Symptomatic but completely ambulatory  Medications:  Allergies as of 08/23/2021       Reactions   Codeine Nausea Only        Medication List        Accurate as of August 23, 2021  2:10 PM. If you have any questions, ask your nurse or doctor.          acetaminophen 325 MG tablet Commonly known as: TYLENOL Take 650 mg by mouth every 6 (six) hours as needed.   ALPRAZolam 0.25 MG tablet Commonly known as: XANAX Take 1 tablet (0.25 mg total) by mouth 3 (three) times daily as needed for anxiety.   alum & mag hydroxide-simeth 200-200-20 MG/5ML suspension Commonly known as: MAALOX/MYLANTA Take 30 mLs by mouth every 4 (four) hours as needed for indigestion.   feeding supplement Liqd Take 237 mLs by mouth 3 (three) times daily between  meals.   midodrine 5 MG tablet Commonly known as: PROAMATINE Take 1 tablet (5 mg total) by mouth 3 (three) times daily with meals.   multivitamin with minerals Tabs tablet Take 1 tablet by mouth daily.   naproxen 500 MG tablet Commonly known as: NAPROSYN Take 1 tablet (500 mg total) by mouth 2 (two) times daily with a meal.   ondansetron 4 MG tablet Commonly known as: ZOFRAN Take 1 tablet (4 mg total) by mouth every 6 (six) hours as needed for nausea.   oxyCODONE 5 MG immediate release tablet Commonly known as: Oxy IR/ROXICODONE Take 1 tablet (5 mg total) by mouth every 4 (four) hours as needed for severe pain.   pantoprazole 40 MG tablet Commonly known as: PROTONIX Take 1 tablet (40 mg total) by mouth daily.   PARoxetine 20 MG tablet Commonly known as: PAXIL Take 1 tablet by mouth daily.        Allergies:  Allergies  Allergen Reactions   Codeine Nausea Only    Past Medical History, Surgical history, Social history, and Family History were reviewed and updated.  Review of Systems: All other 10 point review of systems is negative.   Physical Exam:  weight is 157 lb 1 oz (71.2 kg). His oral temperature is 97.7 F (36.5 C). His blood pressure is 103/64 and his  pulse is 65. His respiration is 18 and oxygen saturation is 98%.   Wt Readings from Last 3 Encounters:  08/23/21 157 lb 1 oz (71.2 kg)  08/09/21 158 lb (71.7 kg)  07/16/21 147 lb (66.7 kg)    Ocular: Sclerae unicteric, pupils equal, round and reactive to light Ear-nose-throat: Oropharynx clear, dentition fair Lymphatic: No cervical or supraclavicular adenopathy Lungs no rales or rhonchi, good excursion bilaterally Heart regular rate and rhythm, no murmur appreciated Abd soft, nontender, positive bowel sounds MSK no focal spinal tenderness, no joint edema Neuro: non-focal, well-oriented, appropriate affect Breasts: Deferred   Lab Results  Component Value Date   WBC 12.4 (H) 08/23/2021   HGB 12.3  (L) 08/23/2021   HCT 40.2 08/23/2021   MCV 85.9 08/23/2021   PLT 360 08/23/2021   Lab Results  Component Value Date   FERRITIN 637 (H) 05/25/2021   IRON 14 (L) 05/25/2021   TIBC 115 (L) 05/25/2021   UIBC 101 05/25/2021   IRONPCTSAT 12 (L) 05/25/2021   Lab Results  Component Value Date   RBC 4.68 08/23/2021   No results found for: "KPAFRELGTCHN", "LAMBDASER", "KAPLAMBRATIO" No results found for: "IGGSERUM", "IGA", "IGMSERUM" No results found for: "TOTALPROTELP", "ALBUMINELP", "A1GS", "A2GS", "BETS", "BETA2SER", "GAMS", "MSPIKE", "SPEI"   Chemistry      Component Value Date/Time   NA 138 08/23/2021 1244   NA 141 05/23/2019 1542   K 4.5 08/23/2021 1244   CL 102 08/23/2021 1244   CO2 29 08/23/2021 1244   BUN 30 (H) 08/23/2021 1244   BUN 16 05/23/2019 1542   CREATININE 1.27 (H) 08/23/2021 1244      Component Value Date/Time   CALCIUM 10.9 (H) 08/23/2021 1244   ALKPHOS 87 08/23/2021 1244   AST 12 (L) 08/23/2021 1244   ALT 9 08/23/2021 1244   BILITOT 0.3 08/23/2021 1244       Impression and Plan: 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  I spoke with Dr. Marin Olp and went over lab work.  We will proceed with treatment today.  Calcium is down to 10.9, no Zometa today.  Miralax script sent.  We will also have him try Neurontin 300 mg PO at bedtime for nerve pain at old left chest tube site.  Follow-up and treatment in 2 weeks.   Lottie Dawson, NP 6/19/20232:10 PM

## 2021-08-24 ENCOUNTER — Encounter: Payer: Self-pay | Admitting: *Deleted

## 2021-08-24 LAB — T4: T4, Total: 8.4 ug/dL (ref 4.5–12.0)

## 2021-08-24 NOTE — Progress Notes (Signed)
Called and spoke to patient's daughter [name]. She is aware of next appointment time.  Oncology Nurse Navigator Documentation     08/23/2021   11:26 AM  Oncology Nurse Navigator Flowsheets  Navigator Follow Up Date: 09/06/2021  Navigator Follow Up Reason: Follow-up Appointment;Chemotherapy  Navigator Location [facility]  Navigator Encounter Type Appt/Treatment Plan Review;Telephone  Telephone Outgoing Call;Appt Confirmation/Clarification  Patient Visit Type MedOnc  Treatment Phase Active Tx  Barriers/Navigation Needs Coordination of Care;Education;Language/Communication;Personal Conflicts  Interventions Coordination of Care  Acuity Level 4-High Needs (Greater Than 4 Barriers Identified)  Coordination of Care Appts  Education Method Verbal  Support Groups/Services Friends and Family  Time Spent with Patient 15

## 2021-08-25 ENCOUNTER — Other Ambulatory Visit: Payer: Self-pay

## 2021-08-25 DIAGNOSIS — K5900 Constipation, unspecified: Secondary | ICD-10-CM

## 2021-08-25 DIAGNOSIS — C3492 Malignant neoplasm of unspecified part of left bronchus or lung: Secondary | ICD-10-CM

## 2021-08-25 DIAGNOSIS — M792 Neuralgia and neuritis, unspecified: Secondary | ICD-10-CM

## 2021-08-25 MED ORDER — GABAPENTIN 300 MG PO CAPS: 300.0000 mg | ORAL_CAPSULE | Freq: Every day | ORAL | 0 refills | Status: DC

## 2021-08-25 MED ORDER — POLYETHYLENE GLYCOL 3350 17 G PO PACK: 17.0000 g | PACK | Freq: Every day | ORAL | 6 refills | Status: DC

## 2021-09-02 ENCOUNTER — Telehealth: Payer: Self-pay | Admitting: *Deleted

## 2021-09-02 NOTE — Telephone Encounter (Signed)
Per 08/23/21 los - called and gave upcoming appointments

## 2021-09-03 ENCOUNTER — Encounter: Payer: Self-pay | Admitting: Hematology & Oncology

## 2021-09-06 ENCOUNTER — Encounter: Payer: Self-pay | Admitting: Family

## 2021-09-06 ENCOUNTER — Other Ambulatory Visit (HOSPITAL_BASED_OUTPATIENT_CLINIC_OR_DEPARTMENT_OTHER): Payer: Self-pay

## 2021-09-06 ENCOUNTER — Inpatient Hospital Stay (HOSPITAL_BASED_OUTPATIENT_CLINIC_OR_DEPARTMENT_OTHER): Payer: Medicaid Other | Admitting: Family

## 2021-09-06 ENCOUNTER — Encounter: Payer: Self-pay | Admitting: *Deleted

## 2021-09-06 ENCOUNTER — Inpatient Hospital Stay: Payer: Medicaid Other | Attending: Hematology & Oncology

## 2021-09-06 ENCOUNTER — Inpatient Hospital Stay: Payer: Medicaid Other

## 2021-09-06 VITALS — BP 104/68 | HR 65 | Temp 98.1°F | Resp 18 | Wt 157.1 lb

## 2021-09-06 VITALS — BP 101/61 | HR 61 | Resp 17

## 2021-09-06 DIAGNOSIS — C3492 Malignant neoplasm of unspecified part of left bronchus or lung: Secondary | ICD-10-CM

## 2021-09-06 DIAGNOSIS — E032 Hypothyroidism due to medicaments and other exogenous substances: Secondary | ICD-10-CM

## 2021-09-06 DIAGNOSIS — J90 Pleural effusion, not elsewhere classified: Secondary | ICD-10-CM

## 2021-09-06 DIAGNOSIS — K5900 Constipation, unspecified: Secondary | ICD-10-CM

## 2021-09-06 DIAGNOSIS — Z79899 Other long term (current) drug therapy: Secondary | ICD-10-CM | POA: Diagnosis not present

## 2021-09-06 DIAGNOSIS — R110 Nausea: Secondary | ICD-10-CM | POA: Diagnosis not present

## 2021-09-06 DIAGNOSIS — Z5112 Encounter for antineoplastic immunotherapy: Secondary | ICD-10-CM | POA: Diagnosis present

## 2021-09-06 LAB — CBC WITH DIFFERENTIAL (CANCER CENTER ONLY)
Abs Immature Granulocytes: 0.05 10*3/uL (ref 0.00–0.07)
Basophils Absolute: 0.1 10*3/uL (ref 0.0–0.1)
Basophils Relative: 1 %
Eosinophils Absolute: 0.5 10*3/uL (ref 0.0–0.5)
Eosinophils Relative: 4 %
HCT: 40.5 % (ref 39.0–52.0)
Hemoglobin: 12.4 g/dL — ABNORMAL LOW (ref 13.0–17.0)
Immature Granulocytes: 0 %
Lymphocytes Relative: 25 %
Lymphs Abs: 2.8 10*3/uL (ref 0.7–4.0)
MCH: 27.1 pg (ref 26.0–34.0)
MCHC: 30.6 g/dL (ref 30.0–36.0)
MCV: 88.4 fL (ref 80.0–100.0)
Monocytes Absolute: 1.4 10*3/uL — ABNORMAL HIGH (ref 0.1–1.0)
Monocytes Relative: 13 %
Neutro Abs: 6.4 10*3/uL (ref 1.7–7.7)
Neutrophils Relative %: 57 %
Platelet Count: 300 10*3/uL (ref 150–400)
RBC: 4.58 MIL/uL (ref 4.22–5.81)
RDW: 14.4 % (ref 11.5–15.5)
WBC Count: 11.2 10*3/uL — ABNORMAL HIGH (ref 4.0–10.5)
nRBC: 0 % (ref 0.0–0.2)

## 2021-09-06 LAB — CMP (CANCER CENTER ONLY)
ALT: 7 U/L (ref 0–44)
AST: 12 U/L — ABNORMAL LOW (ref 15–41)
Albumin: 4.1 g/dL (ref 3.5–5.0)
Alkaline Phosphatase: 83 U/L (ref 38–126)
Anion gap: 7 (ref 5–15)
BUN: 21 mg/dL (ref 8–23)
CO2: 30 mmol/L (ref 22–32)
Calcium: 11 mg/dL — ABNORMAL HIGH (ref 8.9–10.3)
Chloride: 102 mmol/L (ref 98–111)
Creatinine: 1.39 mg/dL — ABNORMAL HIGH (ref 0.61–1.24)
GFR, Estimated: 57 mL/min — ABNORMAL LOW (ref >60)
Glucose, Bld: 92 mg/dL (ref 70–99)
Potassium: 4.7 mmol/L (ref 3.5–5.1)
Sodium: 139 mmol/L (ref 135–145)
Total Bilirubin: 0.3 mg/dL (ref 0.3–1.2)
Total Protein: 7.8 g/dL (ref 6.5–8.1)

## 2021-09-06 LAB — TSH: TSH: 3.426 u[IU]/mL (ref 0.350–4.500)

## 2021-09-06 LAB — LACTATE DEHYDROGENASE: LDH: 160 U/L (ref 98–192)

## 2021-09-06 MED ORDER — DIPHENHYDRAMINE HCL 50 MG/ML IJ SOLN
25.0000 mg | Freq: Once | INTRAMUSCULAR | Status: AC
  Administered 2021-09-06: 25 mg via INTRAVENOUS
  Filled 2021-09-06: qty 1

## 2021-09-06 MED ORDER — OXYCODONE HCL 5 MG PO TABS
5.0000 mg | ORAL_TABLET | ORAL | 0 refills | Status: DC | PRN
  Filled 2021-09-06: qty 90, 15d supply, fill #0

## 2021-09-06 MED ORDER — SODIUM CHLORIDE 0.9 % IV SOLN: Freq: Once | INTRAVENOUS | Status: AC

## 2021-09-06 MED ORDER — SODIUM CHLORIDE 0.9 % IV SOLN
200.0000 mg | Freq: Once | INTRAVENOUS | Status: AC
  Administered 2021-09-06: 200 mg via INTRAVENOUS
  Filled 2021-09-06: qty 20

## 2021-09-06 MED ORDER — ZOLEDRONIC ACID 4 MG/100ML IV SOLN
4.0000 mg | Freq: Once | INTRAVENOUS | Status: AC
  Administered 2021-09-06: 4 mg via INTRAVENOUS
  Filled 2021-09-06: qty 100

## 2021-09-06 MED ORDER — SODIUM CHLORIDE 0.9 % IV SOLN
1.0000 mg/kg | Freq: Once | INTRAVENOUS | Status: AC
  Administered 2021-09-06: 70 mg via INTRAVENOUS
  Filled 2021-09-06: qty 14

## 2021-09-06 MED ORDER — NAPROXEN 500 MG PO TABS
500.0000 mg | ORAL_TABLET | Freq: Two times a day (BID) | ORAL | 0 refills | Status: DC
  Filled 2021-09-06: qty 60, 30d supply, fill #0

## 2021-09-06 MED ORDER — FAMOTIDINE IN NACL 20-0.9 MG/50ML-% IV SOLN
20.0000 mg | Freq: Once | INTRAVENOUS | Status: AC
  Administered 2021-09-06: 20 mg via INTRAVENOUS
  Filled 2021-09-06: qty 50

## 2021-09-06 MED ORDER — ONDANSETRON HCL 4 MG PO TABS
4.0000 mg | ORAL_TABLET | Freq: Four times a day (QID) | ORAL | 0 refills | Status: DC | PRN
  Filled 2021-09-06: qty 20, 5d supply, fill #0

## 2021-09-06 NOTE — Progress Notes (Signed)

## 2021-09-06 NOTE — Progress Notes (Signed)
Hematology and Oncology Follow Up Visit  Lucas Burke [id number] Jun 20, 1960 61 y.o. 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 He is doing well but does note occasional fatigue and dizziness when he stands.  He has added Ensure daily but is still making sure his children eat first. He is doing his best to eat well but admits that he also needs to hydrate better with water. His weight is stable at 157 lbs.  No bleeding, bruising or petechiae noted.  Calcium is 11.0.  He feels that the Neurontin made him nauseated so he has stopped taking.  No fever, chills, vomiting, cough, rash, dizziness, SOB, chest pain, palpitations, abdominal pain or changes in bowel or bladder habits.  Abdominal pain much better on Naproxen.  No swelling, numbness or tingling in his extremities at this time.     ECOG Performance Status: 1 - Symptomatic but completely ambulatory  Medications:  Allergies as of 09/06/2021       Reactions   Codeine Nausea Only        Medication List        Accurate as of September 06, 2021  2:02 PM. If you have any questions, ask your nurse or doctor.          acetaminophen 325 MG tablet Commonly known as: TYLENOL Take 650 mg by mouth every 6 (six) hours as needed.   ALPRAZolam 0.25 MG tablet Commonly known as: XANAX Take 1 tablet (0.25 mg total) by mouth 3 (three) times daily as needed for anxiety.   alum & mag hydroxide-simeth 200-200-20 MG/5ML suspension Commonly known as: MAALOX/MYLANTA Take 30 mLs by mouth every 4 (four) hours as needed for indigestion.   feeding supplement Liqd Take 237 mLs by mouth 3 (three) times daily between meals.   gabapentin 300 MG capsule Commonly known as:  Neurontin Take 1 capsule (300 mg total) by mouth at bedtime.   midodrine 5 MG tablet Commonly known as: PROAMATINE Take 1 tablet (5 mg total) by mouth 3 (three) times daily with meals.   multivitamin with minerals Tabs tablet Take 1 tablet by mouth daily.   naproxen 500 MG tablet Commonly known as: NAPROSYN Take 1 tablet (500 mg total) by mouth 2 (two) times daily with a meal.   ondansetron 4 MG tablet Commonly known as: ZOFRAN Take 1 tablet (4 mg total) by mouth every 6 (six) hours as needed for nausea.   oxyCODONE 5 MG immediate release tablet Commonly known as: Oxy IR/ROXICODONE Take 1 tablet (5 mg total) by mouth every 4 (four) hours as needed for severe pain.   pantoprazole 40 MG tablet Commonly known as: PROTONIX Take 1 tablet (40 mg total) by mouth daily.   PARoxetine 20 MG tablet Commonly known as: PAXIL Take 1 tablet by mouth daily.   polyethylene glycol 17 g packet Commonly known as: MiraLax Take 17 g by mouth daily.        Allergies:  Allergies  Allergen Reactions   Codeine Nausea Only    Past Medical History, Surgical history, Social history, and Family History were reviewed and updated.  Review of Systems: All other 10 point review of systems is negative.   Physical Exam:  weight is 157 lb 1.9 oz (71.3 kg). His oral temperature is 98.1 F (36.7 C). His blood pressure is 104/68 and his pulse is 65. His respiration is 18 and oxygen saturation is 98%.   Wt Readings from Last 3 Encounters:  09/06/21 157 lb 1.9 oz (71.3 kg)  08/23/21 157 lb 1 oz (71.2 kg)  08/09/21 158 lb (71.7 kg)    Ocular: Sclerae unicteric, pupils equal, round and reactive to light Ear-nose-throat: Oropharynx clear, dentition fair Lymphatic: No cervical or supraclavicular adenopathy Lungs no rales or rhonchi, good excursion bilaterally Heart regular rate and rhythm, no murmur appreciated Abd soft, nontender, positive bowel sounds MSK no focal spinal tenderness, no joint  edema Neuro: non-focal, well-oriented, appropriate affect Breasts: Deferred   Lab Results  Component Value Date   WBC 11.2 (H) 09/06/2021   HGB 12.4 (L) 09/06/2021   HCT 40.5 09/06/2021   MCV 88.4 09/06/2021   PLT 300 09/06/2021   Lab Results  Component Value Date   FERRITIN 637 (H) 05/25/2021   IRON 14 (L) 05/25/2021   TIBC 115 (L) 05/25/2021   UIBC 101 05/25/2021   IRONPCTSAT 12 (L) 05/25/2021   Lab Results  Component Value Date   RBC 4.58 09/06/2021   No results found for: "KPAFRELGTCHN", "LAMBDASER", "KAPLAMBRATIO" No results found for: "IGGSERUM", "IGA", "IGMSERUM" No results found for: "TOTALPROTELP", "ALBUMINELP", "A1GS", "A2GS", "BETS", "BETA2SER", "GAMS", "MSPIKE", "SPEI"   Chemistry      Component Value Date/Time   NA 138 08/23/2021 1244   NA 141 05/23/2019 1542   K 4.5 08/23/2021 1244   CL 102 08/23/2021 1244   CO2 29 08/23/2021 1244   BUN 30 (H) 08/23/2021 1244   BUN 16 05/23/2019 1542   CREATININE 1.27 (H) 08/23/2021 1244      Component Value Date/Time   CALCIUM 10.9 (H) 08/23/2021 1244   ALKPHOS 87 08/23/2021 1244   AST 12 (L) 08/23/2021 1244   ALT 9 08/23/2021 1244   BILITOT 0.3 08/23/2021 1244       Impression and Plan: Lucas Burke is a very pleasant 61 yo caucasian gentleman with stage IV 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  We will proceed with Rae Halsted and Zometa today.  Follow-up in 2 weeks.   Lottie Dawson, NP 7/3/20232:02 PM

## 2021-09-06 NOTE — Patient Instructions (Signed)
[facility]  Discharge Instructions: Thank you for choosing [facility] to provide your oncology and hematology care.   If you have a lab appointment with the [facility], please go directly to the [facility] and check in at the registration area.  Wear comfortable clothing and clothing appropriate for easy access to any Portacath or PICC line.   We strive to give you quality time with your provider. You may need to reschedule your appointment if you arrive late (15 or more minutes).  Arriving late affects you and other patients whose appointments are after yours.  Also, if you miss three or more appointments without notifying the office, you may be dismissed from the clinic at the provider's discretion.      For prescription refill requests, have your pharmacy contact our office and allow 72 hours for refills to be completed.    Today you received the following chemotherapy and/or immunotherapy agents Opdivo, Yervoy       To help prevent nausea and vomiting after your treatment, we encourage you to take your nausea medication as directed.  BELOW ARE SYMPTOMS THAT SHOULD BE REPORTED IMMEDIATELY: *FEVER GREATER THAN 100.4 F (38 C) OR HIGHER *CHILLS OR SWEATING *NAUSEA AND VOMITING THAT IS NOT CONTROLLED WITH YOUR NAUSEA MEDICATION *UNUSUAL SHORTNESS OF BREATH *UNUSUAL BRUISING OR BLEEDING *URINARY PROBLEMS (pain or burning when urinating, or frequent urination) *BOWEL PROBLEMS (unusual diarrhea, constipation, pain near the anus) TENDERNESS IN MOUTH AND THROAT WITH OR WITHOUT PRESENCE OF ULCERS (sore throat, sores in mouth, or a toothache) UNUSUAL RASH, SWELLING OR PAIN  UNUSUAL VAGINAL DISCHARGE OR ITCHING   Items with * indicate a potential emergency and should be followed up as soon as possible or go to the Emergency Department if any problems should occur.  Please show the CHEMOTHERAPY ALERT CARD or IMMUNOTHERAPY ALERT CARD at check-in to  the Emergency Department and triage nurse. Should you have questions after your visit or need to cancel or reschedule your appointment, please contact [facility]  [phone] and follow the prompts.  Office hours are 8:00 a.m. to 4:30 p.m. Monday - Friday. Please note that voicemails left after 4:00 p.m. may not be returned until the following business day.  We are closed weekends and major holidays. You have access to a nurse at all times for urgent questions. Please call the main number to the clinic [phone] and follow the prompts.  For any non-urgent questions, you may also contact your provider using MyChart. We now offer e-Visits for anyone 74 and older to request care online for non-urgent symptoms. For details visit [email or web address].   Also download the MyChart app! Go to the app store, search "MyChart", open the app, select [facility], and log in with your MyChart username and password.  Due to Covid, a mask is required upon entering the hospital/clinic. If you do not have a mask, one will be given to you upon arrival. For doctor visits, patients may have 1 support person aged 32 or older with them. For treatment visits, patients cannot have anyone with them due to current Covid guidelines and our immunocompromised population.

## 2021-09-08 LAB — T4: T4, Total: 8.5 ug/dL (ref 4.5–12.0)

## 2021-09-09 NOTE — Progress Notes (Signed)
Patient given food bag from pantry by lab.   Oncology Nurse Navigator Documentation     09/06/2021    2:30 PM  Oncology Nurse Navigator Flowsheets  Navigator Follow Up Date: 09/20/2021  Navigator Follow Up Reason: Follow-up Appointment;Chemotherapy  Navigator Location [facility]  Navigator Encounter Type Treatment;Appt/Treatment Plan Review  Patient Visit Type MedOnc  Treatment Phase Active Tx  Barriers/Navigation Needs Coordination of Care;Education;Language/Communication;Personal Conflicts  Interventions Other  Acuity Level 4-High Needs (Greater Than 4 Barriers Identified)  Support Groups/Services Friends and Family  Time Spent with Patient 15

## 2021-09-13 ENCOUNTER — Other Ambulatory Visit: Payer: Self-pay | Admitting: *Deleted

## 2021-09-13 ENCOUNTER — Other Ambulatory Visit (HOSPITAL_BASED_OUTPATIENT_CLINIC_OR_DEPARTMENT_OTHER): Payer: Self-pay

## 2021-09-13 ENCOUNTER — Other Ambulatory Visit: Payer: Self-pay

## 2021-09-13 MED ORDER — MIDODRINE HCL 5 MG PO TABS: 5.0000 mg | ORAL_TABLET | Freq: Three times a day (TID) | ORAL | 6 refills | Status: DC

## 2021-09-15 ENCOUNTER — Encounter: Payer: Self-pay | Admitting: Hematology & Oncology

## 2021-09-20 ENCOUNTER — Inpatient Hospital Stay: Payer: Medicaid Other

## 2021-09-20 ENCOUNTER — Encounter: Payer: Self-pay | Admitting: *Deleted

## 2021-09-20 ENCOUNTER — Inpatient Hospital Stay (HOSPITAL_BASED_OUTPATIENT_CLINIC_OR_DEPARTMENT_OTHER): Payer: Medicaid Other | Admitting: Hematology & Oncology

## 2021-09-20 ENCOUNTER — Other Ambulatory Visit (HOSPITAL_BASED_OUTPATIENT_CLINIC_OR_DEPARTMENT_OTHER): Payer: Self-pay

## 2021-09-20 ENCOUNTER — Other Ambulatory Visit: Payer: Self-pay

## 2021-09-20 ENCOUNTER — Encounter: Payer: Self-pay | Admitting: Hematology & Oncology

## 2021-09-20 VITALS — BP 109/68 | HR 56 | Temp 98.2°F | Resp 17 | Wt 154.0 lb

## 2021-09-20 DIAGNOSIS — J90 Pleural effusion, not elsewhere classified: Secondary | ICD-10-CM

## 2021-09-20 DIAGNOSIS — C3492 Malignant neoplasm of unspecified part of left bronchus or lung: Secondary | ICD-10-CM

## 2021-09-20 DIAGNOSIS — Z5112 Encounter for antineoplastic immunotherapy: Secondary | ICD-10-CM | POA: Diagnosis not present

## 2021-09-20 DIAGNOSIS — E032 Hypothyroidism due to medicaments and other exogenous substances: Secondary | ICD-10-CM

## 2021-09-20 DIAGNOSIS — R110 Nausea: Secondary | ICD-10-CM

## 2021-09-20 LAB — CBC WITH DIFFERENTIAL (CANCER CENTER ONLY)
Abs Immature Granulocytes: 0.15 10*3/uL — ABNORMAL HIGH (ref 0.00–0.07)
Basophils Absolute: 0.1 10*3/uL (ref 0.0–0.1)
Basophils Relative: 1 %
Eosinophils Absolute: 0.4 10*3/uL (ref 0.0–0.5)
Eosinophils Relative: 4 %
HCT: 42.5 % (ref 39.0–52.0)
Hemoglobin: 13.3 g/dL (ref 13.0–17.0)
Immature Granulocytes: 2 %
Lymphocytes Relative: 19 %
Lymphs Abs: 1.9 10*3/uL (ref 0.7–4.0)
MCH: 27.4 pg (ref 26.0–34.0)
MCHC: 31.3 g/dL (ref 30.0–36.0)
MCV: 87.4 fL (ref 80.0–100.0)
Monocytes Absolute: 1.3 10*3/uL — ABNORMAL HIGH (ref 0.1–1.0)
Monocytes Relative: 13 %
Neutro Abs: 6.1 10*3/uL (ref 1.7–7.7)
Neutrophils Relative %: 61 %
Platelet Count: 282 10*3/uL (ref 150–400)
RBC: 4.86 MIL/uL (ref 4.22–5.81)
RDW: 13.3 % (ref 11.5–15.5)
WBC Count: 9.9 10*3/uL (ref 4.0–10.5)
nRBC: 0 % (ref 0.0–0.2)

## 2021-09-20 LAB — CMP (CANCER CENTER ONLY)
ALT: 6 U/L (ref 0–44)
AST: 11 U/L — ABNORMAL LOW (ref 15–41)
Albumin: 4.1 g/dL (ref 3.5–5.0)
Alkaline Phosphatase: 74 U/L (ref 38–126)
Anion gap: 8 (ref 5–15)
BUN: 28 mg/dL — ABNORMAL HIGH (ref 8–23)
CO2: 29 mmol/L (ref 22–32)
Calcium: 11.6 mg/dL — ABNORMAL HIGH (ref 8.9–10.3)
Chloride: 100 mmol/L (ref 98–111)
Creatinine: 1.46 mg/dL — ABNORMAL HIGH (ref 0.61–1.24)
GFR, Estimated: 54 mL/min — ABNORMAL LOW (ref >60)
Glucose, Bld: 107 mg/dL — ABNORMAL HIGH (ref 70–99)
Potassium: 4.3 mmol/L (ref 3.5–5.1)
Sodium: 137 mmol/L (ref 135–145)
Total Bilirubin: 0.3 mg/dL (ref 0.3–1.2)
Total Protein: 8 g/dL (ref 6.5–8.1)

## 2021-09-20 LAB — TSH: TSH: 3.021 u[IU]/mL (ref 0.350–4.500)

## 2021-09-20 LAB — LACTATE DEHYDROGENASE: LDH: 207 U/L — ABNORMAL HIGH (ref 98–192)

## 2021-09-20 NOTE — Progress Notes (Signed)
Patient continues to have difficulty with food security. He has government assistance, but also has three children at home. Food bag and Ensure samples given today. Nutrition and Social work consult placed.   When MD went into exam room patient was gone. He did not check out with any staff member.   Called patient and left a voicemail requesting a call back to work out rescheduling and to notify him of other appointments.   Oncology Nurse Navigator Documentation     09/20/2021   10:00 AM  Oncology Nurse Navigator Flowsheets  Navigator Location [facility]  Navigator Encounter Type Treatment;Appt/Treatment Plan Review  Patient Visit Type MedOnc  Treatment Phase Active Tx  Barriers/Navigation Needs Coordination of Care;Education;Language/Communication;Personal Conflicts  Education Other  Interventions Referrals  Acuity Level 4-High Needs (Greater Than 4 Barriers Identified)  Referrals Nutrition/dietician;Social Work  Support Groups/Services Friends and Family  Time Spent with Patient 30

## 2021-09-21 ENCOUNTER — Telehealth: Payer: Self-pay | Admitting: Dietician

## 2021-09-21 ENCOUNTER — Inpatient Hospital Stay: Payer: Medicaid Other | Admitting: Dietician

## 2021-09-21 ENCOUNTER — Encounter: Payer: Self-pay | Admitting: *Deleted

## 2021-09-21 NOTE — Progress Notes (Signed)
Spoke with [employer], nutritionist, regarding patient's leaving yesterday and being unaware of today's phone consult. She will attempt contact.   Called patient again this morning. No contact made. Detailed message left on personal voice mail.   Oncology Nurse Navigator Documentation     09/21/2021    9:30 AM  Oncology Nurse Navigator Flowsheets  Navigator Location [facility]  Navigator Encounter Type Telephone  Telephone Outgoing Call  Patient Visit Type MedOnc  Treatment Phase Active Tx  Barriers/Navigation Needs Coordination of Care;Education;Language/Communication;Personal Conflicts  Interventions Disability/FMLA  Acuity Level 4-High Needs (Greater Than 4 Barriers Identified)  Coordination of Care Other  Support Groups/Services Friends and Family  Time Spent with Patient 15

## 2021-09-21 NOTE — Telephone Encounter (Signed)
Patient was scheduled for an in-person visit today.  I called to make sure he knew I was at the [facility].  Offered to reschedule virtual appointment if more convenient.  LM on voice mail to return call, also sent text.  April Manson, RDN, LDN Registered Dietitian, [place] Part Time Remote (Usual office hours: Tuesday-Thursday) Mobile: [phone] Remote Office: [phone]

## 2021-09-21 NOTE — Progress Notes (Signed)
Patient was scheduled for an in-person nutrition visit and did not show.  I proactively called patient to reschedule for virtual appointment since the [place] location is not near his home. He didn't respond to message or text left this morning either. Will attempt to reach for virtual consult in response to referral from RN.  April Manson, RDN, LDN Registered Dietitian, [place] Part Time Remote (Usual office hours: Tuesday-Thursday) Mobile: [phone] Remote Office: [phone]

## 2021-09-22 ENCOUNTER — Inpatient Hospital Stay: Payer: Medicaid Other | Admitting: Licensed Clinical Social Worker

## 2021-09-22 ENCOUNTER — Other Ambulatory Visit: Payer: Medicaid Other | Admitting: Licensed Clinical Social Worker

## 2021-09-22 ENCOUNTER — Telehealth: Payer: Self-pay

## 2021-09-22 ENCOUNTER — Encounter: Payer: Self-pay | Admitting: *Deleted

## 2021-09-22 LAB — T4: T4, Total: 9.7 ug/dL (ref 4.5–12.0)

## 2021-09-22 NOTE — Telephone Encounter (Signed)
CSW contacted patient to assess psychosocial needs per the request of the Nurse Navigator.  CSW left a VM and will follow up with him next Wednesday, 09/29/21.

## 2021-09-22 NOTE — Progress Notes (Signed)
Attempted to call patient. Unable to make contact. Voicemail left. Will also send letter to patient home requesting he call to reschedule appointments.  Oncology Nurse Navigator Documentation     09/22/2021   11:45 AM  Oncology Nurse Navigator Flowsheets  Navigator Location [facility]  Navigator Encounter Type Telephone;Letter/Fax/Email  Telephone Outgoing Call  Patient Visit Type MedOnc  Treatment Phase Active Tx  Barriers/Navigation Needs Coordination of Care;Education;Language/Communication;Personal Conflicts  Acuity Level 4-High Needs (Greater Than 4 Barriers Identified)  Coordination of Care Other  Support Groups/Services Friends and Family  Time Spent with Patient 15

## 2021-09-22 NOTE — Telephone Encounter (Signed)
Per the discussion with patient's daughter, [name], this morning, CSW called patient at 2pm today.  Patient did not answer and CSW left a vm.

## 2021-09-22 NOTE — Progress Notes (Signed)
[facility] Work  Clinical Social Work was referred by Art therapist for assessment of psychosocial needs.  Clinical Social Worker contacted caregiver by phone  to offer support and assess for needs.    CSW spoke with patient's daughter, Ashford Clouse.  Informed her that CSW attempted to call patient this morning.  She said he would not answer the phone if he did not recognize the number.  Rescheduled phone appointment with patient today at 2pm.  She said she would tell her father.  [name] expressed concern regarding patient's financial situation.  He is on disability and receives a set income.  He has three minor children at home.  They are ages 48, 60 and 16.  She stated there were no other adults in the home.  Informed her that CSW would explore resources with him during the appointment this afternoon and she said she understood.  Margaree Mackintosh, LCSW  Clinical Social Worker [facility]        Patient is participating in a Managed Medicaid Plan:  Yes

## 2021-09-23 ENCOUNTER — Encounter: Payer: Self-pay | Admitting: Hematology & Oncology

## 2021-09-23 ENCOUNTER — Telehealth: Payer: Self-pay | Admitting: *Deleted

## 2021-09-23 ENCOUNTER — Inpatient Hospital Stay: Payer: Medicaid Other

## 2021-09-23 ENCOUNTER — Telehealth: Payer: Self-pay | Admitting: Dietician

## 2021-09-23 NOTE — Telephone Encounter (Signed)
Patient called to reschedule appointments that he missed. He is confirmed to come in @ 09/28/21 to see Dr. Marin Olp @ 8:00 a.m. and then have his treatment.

## 2021-09-23 NOTE — Progress Notes (Signed)
[facility] CSW Counseling Note  Patient was referred by nurse navigator. Treatment type: Individual  Presenting Concerns: Patient and/or family reports the following symptoms/concerns: anxiety Duration of problem:  years; Severity of problem: severe.  Patient reports being on disability for two back surgeries, depression and anxiety.  He is currently taking psychotropic medications and feels his depression is being managed.  He said he left the cancer center without telling staff on Monday due to his anxiety.   Orientation:oriented to person, place, time/date, and situation.   Affect: NA Risk of harm to self or others: No plan to harm self or others  Patient and/or Family's Strengths/Protective Factors: Social connections, Concrete supports in place (healthy food, safe environments, etc.), Sense of purpose, and Caregiver has knowledge of parenting & child developmentCapable of independent living  Communication skills  General fund of knowledge  Supportive family/friends   His daughter, [name], is very supportive and assists patient as needed.     Goals Addressed: Patient will:  Reduce symptoms of: anxiety Increase knowledge and/or ability of: stress reduction  Increase motivation to adhere to plan of care   Progress towards Goals: Initial   Interventions: Interventions utilized:  Motivational Interviewing and Supportive.  Patient reported a decrease in his anxiety after speaking with CSW.  Sent referral information to [place] regarding [employer].  Emailed Sierra's contact information to [employer] at [email or web address].  Also emailed her information on other grants, including:  [employer], [employer] and [employer].  Patient has custody of his three children ages 47, 108 and 68.  Mailed patient packet of program information per his request.     Assessment: Patient currently experiencing anxiety.   Patient may benefit from individual counseling.   Plan: Follow up with  CSW: in 7 days Behavioral recommendations: Increase self care and communication. Referral(s): Patient stated he will seek a counselor in the future.       [address], LCSW

## 2021-09-23 NOTE — Telephone Encounter (Signed)
Call received from patient apologizing for having to leave without being seen from his appts on 09/20/21. He states that due to his social anxiety, he needed to leave and was taken home by transportation. He also stated that his voicemail has not been working correctly, but that he has it fixed now and would like to speak with a Education officer, museum.  Pt.'s rescheduled appts for 10/04/21 given to patient and Jasmine Awe MSW phone number given to patient.  Pt is appreciative of assistance and again apologized for having to leave on 09/20/21.  Emotional support given to pt.

## 2021-09-23 NOTE — Progress Notes (Signed)
The patient had to leave before I could see him.  As such, this is not a visit for him.  He will be rescheduled.  [name], MD

## 2021-09-23 NOTE — Telephone Encounter (Signed)
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  He has a PMHx that includes: PNA, Chronic back pain, chronic pain syndrome, iron deficiency anemia.  Current treatment is : Opdivo/Yervoy q 42 days, Zometa 4 mg IV monthly   Spoke with patient over the telephone who reports he is not having any pain or difficult eating.  He is struggling financially as he is the father of 3 and on SNAP benefits.  He is taking 3 bottle of Ensure a day to help him maintain his weight.  He is agreeable to trialing higher protein high calorie diet of real food.    Breakfast: (kids eat at school sometime he's skipping this meal now)  Like sausage biscuits, eggs,  Lunch sandwich Dinner: varies meats and 2-3 vegetables Fluids: 2 regular  cokes (not sure 12-16 oz) 1 cup milk Large OJ with his Miralax 1-2 glasses of water  Anthropometrics:   Height: 72" Weight:  09/20/21  154# 09/06/21    157# 08/23/21  157# 6/5  158# UBW: 175# BMI: 20.89  INTERVENTION: Reviewed cost effective choices for increasing calories and protein with peanut butter, legumes, tuna, mayonnaise. Mailing recipes with out dairy to help increase calories and achieve goal of 2-5# weight gain.  Discussed grocery stores that may provide better pricing.  Also mailing coupons for Ensure products if he decides he wants to continue with ONS.   MONITORING, EVALUATION, GOAL: weight trends, PO intake   Next Visit: Telephone follow up 2 weeks  April Manson, RDN, LDN Registered Dietitian, [place] Part Time Remote (Usual office hours: Tuesday-Thursday) Cell: [phone]

## 2021-09-24 ENCOUNTER — Other Ambulatory Visit: Payer: Self-pay | Admitting: *Deleted

## 2021-09-24 DIAGNOSIS — C3492 Malignant neoplasm of unspecified part of left bronchus or lung: Secondary | ICD-10-CM

## 2021-09-24 DIAGNOSIS — J90 Pleural effusion, not elsewhere classified: Secondary | ICD-10-CM

## 2021-09-24 DIAGNOSIS — R110 Nausea: Secondary | ICD-10-CM

## 2021-09-24 MED ORDER — ALPRAZOLAM 0.25 MG PO TABS: 0.2500 mg | ORAL_TABLET | Freq: Three times a day (TID) | ORAL | 0 refills | Status: AC | PRN

## 2021-09-24 MED ORDER — ONDANSETRON HCL 4 MG PO TABS: 4.0000 mg | ORAL_TABLET | Freq: Four times a day (QID) | ORAL | 0 refills | Status: DC | PRN

## 2021-09-26 ENCOUNTER — Other Ambulatory Visit: Payer: Self-pay | Admitting: Family

## 2021-09-26 DIAGNOSIS — M792 Neuralgia and neuritis, unspecified: Secondary | ICD-10-CM

## 2021-09-26 DIAGNOSIS — C3492 Malignant neoplasm of unspecified part of left bronchus or lung: Secondary | ICD-10-CM

## 2021-09-27 ENCOUNTER — Other Ambulatory Visit: Payer: Self-pay

## 2021-09-27 ENCOUNTER — Other Ambulatory Visit (HOSPITAL_BASED_OUTPATIENT_CLINIC_OR_DEPARTMENT_OTHER): Payer: Self-pay

## 2021-09-27 ENCOUNTER — Encounter: Payer: Self-pay | Admitting: Hematology & Oncology

## 2021-09-28 ENCOUNTER — Inpatient Hospital Stay (HOSPITAL_BASED_OUTPATIENT_CLINIC_OR_DEPARTMENT_OTHER): Payer: Medicaid Other | Admitting: Hematology & Oncology

## 2021-09-28 ENCOUNTER — Inpatient Hospital Stay: Payer: Medicaid Other

## 2021-09-28 ENCOUNTER — Other Ambulatory Visit: Payer: Self-pay

## 2021-09-28 ENCOUNTER — Encounter: Payer: Self-pay | Admitting: *Deleted

## 2021-09-28 ENCOUNTER — Encounter: Payer: Self-pay | Admitting: Hematology & Oncology

## 2021-09-28 VITALS — BP 82/61 | HR 58 | Temp 98.0°F

## 2021-09-28 VITALS — BP 89/73 | HR 80 | Temp 97.8°F | Resp 19 | Wt 158.0 lb

## 2021-09-28 DIAGNOSIS — Z5112 Encounter for antineoplastic immunotherapy: Secondary | ICD-10-CM | POA: Diagnosis not present

## 2021-09-28 DIAGNOSIS — C3492 Malignant neoplasm of unspecified part of left bronchus or lung: Secondary | ICD-10-CM

## 2021-09-28 DIAGNOSIS — E860 Dehydration: Secondary | ICD-10-CM

## 2021-09-28 MED ORDER — SODIUM CHLORIDE 0.9 % IV SOLN: Freq: Once | INTRAVENOUS | Status: AC

## 2021-09-28 MED ORDER — ZOLEDRONIC ACID 4 MG/100ML IV SOLN
4.0000 mg | Freq: Once | INTRAVENOUS | Status: AC
  Administered 2021-09-28: 4 mg via INTRAVENOUS
  Filled 2021-09-28: qty 100

## 2021-09-28 MED ORDER — SODIUM CHLORIDE 0.9 % IV SOLN
200.0000 mg | Freq: Once | INTRAVENOUS | Status: AC
  Administered 2021-09-28: 200 mg via INTRAVENOUS
  Filled 2021-09-28: qty 20

## 2021-09-28 MED ORDER — CEFDINIR 300 MG PO CAPS: 600.0000 mg | ORAL_CAPSULE | Freq: Every day | ORAL | 0 refills | Status: DC

## 2021-09-28 NOTE — Progress Notes (Signed)
Patient called and was rescheduled for MD visit and treatment today. He states that he needed to leave last week due to anxiety. He has made contact with Social Work and Dietary.   Spoke with patient and he states he is appreciative of the help and he feels like the referrals have already been helpful.  Calendar of upcoming appointments printed and reviewed with patient.  Oncology Nurse Navigator Documentation     09/28/2021    9:30 AM  Oncology Nurse Navigator Flowsheets  Navigator Follow Up Date: 10/26/2021  Navigator Follow Up Reason: Follow-up Appointment;Chemotherapy  Navigator Location [facility]  Navigator Encounter Type Treatment;Appt/Treatment Plan Review  Patient Visit Type MedOnc  Treatment Phase Active Tx  Barriers/Navigation Needs Coordination of Care;Education;Language/Communication;Personal Conflicts  Education Other  Interventions Coordination of Care;Education;Psycho-Social Support  Acuity Level 4-High Needs (Greater Than 4 Barriers Identified)  Coordination of Care Other  Education Method Verbal  Support Groups/Services Friends and Family  Time Spent with Patient 30

## 2021-09-28 NOTE — Progress Notes (Signed)
Ok to use 09/20/21 labs per Dr Marin Olp for todays treatment

## 2021-09-28 NOTE — Patient Instructions (Signed)
Nivolumab injection What is this medication? NIVOLUMAB (nye VOL ue mab) is a monoclonal antibody. It treats certain types of cancer. Some of the cancers treated are colon cancer, head and neck cancer, Hodgkin lymphoma, lung cancer, and melanoma. This medicine may be used for other purposes; ask your health care provider or pharmacist if you have questions. COMMON BRAND NAME(S): Opdivo What should I tell my care team before I take this medication? They need to know if you have any of these conditions: Autoimmune diseases such as Crohn's disease, ulcerative colitis, or lupus Have had or planning to have an allogeneic stem cell transplant (uses someone else's stem cells) History of chest radiation Organ transplant Nervous system problems such as myasthenia gravis or Guillain-Barre syndrome An unusual or allergic reaction to nivolumab, other medicines, foods, dyes, or preservatives Pregnant or trying to get pregnant Breast-feeding How should I use this medication? This medication is injected into a vein. It is given in a hospital or clinic setting. A special MedGuide will be given to you before each treatment. Be sure to read this information carefully each time. Talk to your care team regarding the use of this medication in children. While it may be prescribed for children as young as 12 years for selected conditions, precautions do apply. Overdosage: If you think you have taken too much of this medicine contact a poison control center or emergency room at once. NOTE: This medicine is only for you. Do not share this medicine with others. What if I miss a dose? Keep appointments for follow-up doses. It is important not to miss your dose. Call your care team if you are unable to keep an appointment. What may interact with this medication? Interactions have not been studied. This list may not describe all possible interactions. Give your health care provider a list of all the medicines, herbs,  non-prescription drugs, or dietary supplements you use. Also tell them if you smoke, drink alcohol, or use illegal drugs. Some items may interact with your medicine. What should I watch for while using this medication? Your condition will be monitored carefully while you are receiving this medication. You may need blood work done while you are taking this medication. Do not become pregnant while taking this medication or for 5 months after stopping it. Women should inform their care team if they wish to become pregnant or think they might be pregnant. There is a potential for serious harm to an unborn child. Talk to your care team for more information. Do not breast-feed an infant while taking this medication or for 5 months after stopping it. What side effects may I notice from receiving this medication? Side effects that you should report to your care team as soon as possible: Allergic reactions--skin rash, itching, hives, swelling of the face, lips, tongue, or throat Bloody or black, tar-like stools Change in vision Chest pain Diarrhea Dry cough, shortness of breath or trouble breathing Eye pain Fast or irregular heartbeat Fever, chills High blood sugar (hyperglycemia)--increased thirst or amount of urine, unusual weakness or fatigue, blurry vision High thyroid levels (hyperthyroidism)--fast or irregular heartbeat, weight loss, excessive sweating or sensitivity to heat, tremors or shaking, anxiety, nervousness, irregular menstrual cycle or spotting Kidney injury--decrease in the amount of urine, swelling of the ankles, hands, or feet Liver injury--right upper belly pain, loss of appetite, nausea, light-colored stool, dark yellow or brown urine, yellowing skin or eyes, unusual weakness or fatigue Low red blood cell count--unusual weakness or fatigue, dizziness, headache, trouble breathing  Low thyroid levels (hypothyroidism)--unusual weakness or fatigue, increased sensitivity to cold,  constipation, hair loss, dry skin, weight gain, feelings of depression Mood and behavior changes-confusion, change in sex drive or performance, irritability Muscle pain or cramps Pain, tingling, or numbness in the hands or feet, muscle weakness, trouble walking, loss of balance or coordination Red or dark brown urine Redness, blistering, peeling, or loosening of the skin, including inside the mouth Stomach pain Unusual bruising or bleeding Side effects that usually do not require medical attention (report to your care team if they continue or are bothersome): Bone pain Constipation Loss of appetite Nausea Tiredness Vomiting This list may not describe all possible side effects. Call your doctor for medical advice about side effects. You may report side effects to FDA at 1-800-FDA-1088. Where should I keep my medication? This medication is given in a hospital or clinic and will not be stored at home. NOTE: This sheet is a summary. It may not cover all possible information. If you have questions about this medicine, talk to your doctor, pharmacist, or health care provider.  2023 Elsevier/Gold Standard (2021-01-22 00:00:00)

## 2021-09-28 NOTE — Progress Notes (Signed)
This is the patient had to leave before I could see him.  As such, this is not a visit for him.  He will be rescheduled.  Lucas Haw, MD Hematology and Oncology Follow Up Visit  Lucas Burke [id number] June 04, 1960 61 y.o. 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  He actually looks pretty good.  He has tolerated the immunotherapy quite nicely.  He has had no problems with diarrhea.  His appetite is doing a little bit better.  He is eating a little bit more.  He has had little bit of pain where he had the chest tube in place over on his left side.  He has had little bit of a cough.  It is productive of brownish sputum.  We will have to get him on some antibiotic for this in my opinion.  He has had no bleeding.  There is been no leg swelling.  He has had no rashes.  Overall, I was his performance status is probably ECOG 1.    Medications:  Allergies as of 09/28/2021       Reactions   Codeine Nausea Only        Medication List        Accurate as of September 28, 2021  8:22 AM. If you have any questions, ask your nurse or doctor.          acetaminophen 325 MG tablet Commonly known as: TYLENOL Take 650 mg by mouth every 6 (six) hours as needed.   ALPRAZolam 0.25 MG tablet Commonly known as: XANAX Take 1 tablet (0.25 mg total) by mouth 3 (three) times daily as needed for anxiety.   alum & mag hydroxide-simeth 200-200-20 MG/5ML suspension Commonly known as: MAALOX/MYLANTA Take 30 mLs by mouth every 4 (four) hours as needed for indigestion.   feeding supplement Liqd Take 237 mLs by mouth 3 (three) times daily between meals.   gabapentin 300 MG capsule Commonly known as: NEURONTIN TAKE 1  CAPSULE(300 MG) BY MOUTH AT BEDTIME   midodrine 5 MG tablet Commonly known as: PROAMATINE Take 1 tablet (5 mg total) by mouth 3 (three) times daily with meals.   multivitamin with minerals Tabs tablet Take 1 tablet by mouth daily.   naproxen 500 MG tablet Commonly known as: NAPROSYN Take 1 tablet (500 mg total) by mouth 2 (two) times daily with a meal.   ondansetron 4 MG tablet Commonly known as: ZOFRAN Take 1 tablet (4 mg total) by mouth every 6 (six) hours as needed for nausea.   oxyCODONE 5 MG immediate release tablet Commonly known as: Oxy IR/ROXICODONE Take 1 tablet (5 mg total) by mouth every 4 (four) hours as needed for severe pain.   pantoprazole 40 MG tablet Commonly known as: PROTONIX Take 1 tablet (40 mg total) by mouth daily.   PARoxetine 20 MG tablet Commonly known as: PAXIL Take 1 tablet by mouth daily.   polyethylene glycol 17 g packet Commonly known as: MiraLax Take 17 g by mouth daily.        Allergies:  Allergies  Allergen Reactions   Codeine Nausea Only    Past Medical History, Surgical history, Social history, and Family  History were reviewed and updated.  Review of Systems: Review of Systems  Constitutional: Negative.   HENT: Negative.    Eyes: Negative.   Respiratory:  Positive for cough and sputum production.   Cardiovascular: Negative.   Gastrointestinal: Negative.   Genitourinary: Negative.   Musculoskeletal: Negative.   Skin: Negative.   Neurological:  Positive for tingling.  Endo/Heme/Allergies: Negative.   Psychiatric/Behavioral: Negative.  Negative for depression.      Physical Exam:  weight is 158 lb (71.7 kg). His oral temperature is 97.8 F (36.6 C). His blood pressure is 89/73 (abnormal) and his pulse is 80. His respiration is 19 and oxygen saturation is 95%.   Wt Readings from Last 3 Encounters:  09/28/21 158 lb (71.7 kg)  09/20/21 154 lb (69.9 kg)  09/06/21 157 lb 1.9 oz (71.3 kg)   Physical Exam Vitals  reviewed.  HENT:     Head: Normocephalic and atraumatic.  Eyes:     Pupils: Pupils are equal, round, and reactive to light.  Cardiovascular:     Rate and Rhythm: Normal rate and regular rhythm.     Heart sounds: Normal heart sounds.  Pulmonary:     Effort: Pulmonary effort is normal.     Breath sounds: Normal breath sounds.  Abdominal:     General: Bowel sounds are normal.     Palpations: Abdomen is soft.  Musculoskeletal:        General: No tenderness or deformity. Normal range of motion.     Cervical back: Normal range of motion.  Lymphadenopathy:     Cervical: No cervical adenopathy.  Skin:    General: Skin is warm and dry.     Findings: No erythema or rash.  Neurological:     Mental Status: He is alert and oriented to person, place, and time.  Psychiatric:        Behavior: Behavior normal.        Thought Content: Thought content normal.        Judgment: Judgment normal.      Lab Results  Component Value Date   WBC 9.9 09/20/2021   HGB 13.3 09/20/2021   HCT 42.5 09/20/2021   MCV 87.4 09/20/2021   PLT 282 09/20/2021   Lab Results  Component Value Date   FERRITIN 637 (H) 05/25/2021   IRON 14 (L) 05/25/2021   TIBC 115 (L) 05/25/2021   UIBC 101 05/25/2021   IRONPCTSAT 12 (L) 05/25/2021   Lab Results  Component Value Date   RBC 4.86 09/20/2021   No results found for: "KPAFRELGTCHN", "LAMBDASER", "KAPLAMBRATIO" No results found for: "IGGSERUM", "IGA", "IGMSERUM" No results found for: "TOTALPROTELP", "ALBUMINELP", "A1GS", "A2GS", "BETS", "BETA2SER", "GAMS", "MSPIKE", "SPEI"   Chemistry      Component Value Date/Time   NA 137 09/20/2021 0837   NA 141 05/23/2019 1542   K 4.3 09/20/2021 0837   CL 100 09/20/2021 0837   CO2 29 09/20/2021 0837   BUN 28 (H) 09/20/2021 0837   BUN 16 05/23/2019 1542   CREATININE 1.46 (H) 09/20/2021 0837      Component Value Date/Time   CALCIUM 11.6 (H) 09/20/2021 0837   ALKPHOS 74 09/20/2021 0837   AST 11 (L) 09/20/2021 0837    ALT 6 09/20/2021 0837   BILITOT 0.3 09/20/2021 0837       Impression and Plan: Lucas Burke is a very pleasant 61 yo caucasian gentleman with stage IV 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   I really have to  say that he is doing much better than I would have thought.  I am happy about this.  There is no problems with hypercalcemia which typically is a marker for disease activity.  His calcium is up a little bit but yet I will think this is much of a problem for him.  We will go ahead and give him Zometa today.  I still think we have to be patient with the immunotherapy.  He will get the immunotherapy today and in 2 weeks.  I will then plan to see him back in 1 month for his third cycle of treatment.  After the third cycle, I will then consider him for scans.   Volanda Napoleon, MD 7/25/20238:22 AM

## 2021-09-28 NOTE — Addendum Note (Signed)
Addended by: Volanda Napoleon on: 09/28/2021 09:43 AM   Modules accepted: Orders

## 2021-09-29 ENCOUNTER — Other Ambulatory Visit: Payer: Self-pay

## 2021-09-29 ENCOUNTER — Other Ambulatory Visit: Payer: Medicaid Other | Admitting: Licensed Clinical Social Worker

## 2021-09-30 ENCOUNTER — Inpatient Hospital Stay: Payer: Medicaid Other

## 2021-09-30 ENCOUNTER — Telehealth: Payer: Self-pay

## 2021-09-30 NOTE — Telephone Encounter (Signed)
CSW phoned patient at 10am as agreed upon during last conversation to assess psychosocial needs.  Left vm. Jasmine Awe, LCSW
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
## 2021-10-04 ENCOUNTER — Other Ambulatory Visit: Payer: Medicaid Other

## 2021-10-04 ENCOUNTER — Ambulatory Visit: Payer: Medicaid Other | Admitting: Hematology & Oncology

## 2021-10-04 ENCOUNTER — Ambulatory Visit: Payer: Medicaid Other

## 2021-10-05 ENCOUNTER — Other Ambulatory Visit: Payer: Self-pay

## 2021-10-07 ENCOUNTER — Inpatient Hospital Stay: Payer: Medicaid Other | Attending: Hematology & Oncology | Admitting: Dietician

## 2021-10-07 ENCOUNTER — Inpatient Hospital Stay: Payer: Medicaid Other

## 2021-10-07 DIAGNOSIS — Z5112 Encounter for antineoplastic immunotherapy: Secondary | ICD-10-CM | POA: Insufficient documentation

## 2021-10-07 DIAGNOSIS — C3492 Malignant neoplasm of unspecified part of left bronchus or lung: Secondary | ICD-10-CM | POA: Insufficient documentation

## 2021-10-07 DIAGNOSIS — Z79899 Other long term (current) drug therapy: Secondary | ICD-10-CM | POA: Insufficient documentation

## 2021-10-07 NOTE — Progress Notes (Signed)
Reached on to patient for scheduled nutrition follow up.  Attempt to reach but got voice mail. Provided my cell# on voice mail and relayed that I would reschedule after next infusion but to return call to set up a nutrition consult sooner if he was having concerns.  Weight was up 4# in a week since previous nutrition consult  April Manson, RDN, LDN Registered Dietitian, [place] (Usual office hours: Tuesday-Thursday) Cell: [phone]

## 2021-10-12 ENCOUNTER — Inpatient Hospital Stay: Payer: Medicaid Other

## 2021-10-14 ENCOUNTER — Inpatient Hospital Stay: Payer: Medicaid Other

## 2021-10-15 ENCOUNTER — Other Ambulatory Visit: Payer: Self-pay | Admitting: *Deleted

## 2021-10-15 DIAGNOSIS — K5900 Constipation, unspecified: Secondary | ICD-10-CM

## 2021-10-15 DIAGNOSIS — C3492 Malignant neoplasm of unspecified part of left bronchus or lung: Secondary | ICD-10-CM

## 2021-10-15 DIAGNOSIS — J90 Pleural effusion, not elsewhere classified: Secondary | ICD-10-CM

## 2021-10-15 DIAGNOSIS — R110 Nausea: Secondary | ICD-10-CM

## 2021-10-15 MED ORDER — POLYETHYLENE GLYCOL 3350 17 G PO PACK: 17.0000 g | PACK | Freq: Every day | ORAL | 6 refills | Status: DC

## 2021-10-15 MED ORDER — ONDANSETRON HCL 4 MG PO TABS: 4.0000 mg | ORAL_TABLET | Freq: Four times a day (QID) | ORAL | 3 refills | Status: AC | PRN

## 2021-10-15 MED ORDER — OXYCODONE HCL 5 MG PO TABS: 5.0000 mg | ORAL_TABLET | ORAL | 0 refills | Status: DC | PRN

## 2021-10-15 MED ORDER — CEFDINIR 300 MG PO CAPS: 600.0000 mg | ORAL_CAPSULE | Freq: Every day | ORAL | 0 refills | Status: DC

## 2021-10-18 NOTE — Progress Notes (Unsigned)
Patient was not treated on 8/10, orders for cycle 2 day 29 deleted per Dr. Antonieta Pert instructions. Zometa re-entered per Dr. Antonieta Pert instructions.

## 2021-10-19 ENCOUNTER — Telehealth: Payer: Self-pay | Admitting: *Deleted

## 2021-10-19 NOTE — Telephone Encounter (Signed)
Per scheduling message Sheria Lang - called and lvm of appointment for 10/22/21 being canceled.- requested call back if had questions.

## 2021-10-20 ENCOUNTER — Encounter: Payer: Medicaid Other | Admitting: Dietician

## 2021-10-21 ENCOUNTER — Other Ambulatory Visit: Payer: Self-pay

## 2021-10-22 ENCOUNTER — Ambulatory Visit: Payer: Medicaid Other

## 2021-10-22 ENCOUNTER — Telehealth: Payer: Self-pay

## 2021-10-22 ENCOUNTER — Inpatient Hospital Stay: Payer: Medicaid Other

## 2021-10-22 NOTE — Telephone Encounter (Signed)
CSW received request from patient's daughter to contact him to assess psychosocial issues.  Left vm.

## 2021-10-25 ENCOUNTER — Inpatient Hospital Stay: Payer: Medicaid Other

## 2021-10-25 NOTE — Progress Notes (Signed)
[place] CSW Progress Note  Clinical Education officer, museum contacted caregiver by phone to discuss her concerns regarding patient.  [name], Lucas Burke, who also goes by Lucas Burke, stated she feels her father is depressed.    He has missed two infusion appointments.  She has attempted to put systems in place to help patient stay organized.  He have received assistance from the [employer].  CSW made referral for Meals on Wheels per daughter's request.  CSW will send request to [facility] for financial assistance and peer support.  Lucas Burke stated her half-siblings are being well taken care of.  CSW to follow up with patient.    [name] [name], LCSW    Patient is participating in a Managed Medicaid Plan:  Yes

## 2021-10-26 ENCOUNTER — Inpatient Hospital Stay: Payer: Medicaid Other

## 2021-10-26 ENCOUNTER — Inpatient Hospital Stay: Payer: Medicaid Other | Admitting: Hematology & Oncology

## 2021-10-26 ENCOUNTER — Encounter: Payer: Self-pay | Admitting: *Deleted

## 2021-10-26 NOTE — Progress Notes (Signed)
Patient didn't show up to today's appointment. Rescheduled to 10/28/2021.  Oncology Nurse Navigator Documentation     10/26/2021    2:30 PM  Oncology Nurse Navigator Flowsheets  Navigator Follow Up Date: 10/28/2021  Navigator Follow Up Reason: Follow-up Appointment;Chemotherapy  Navigator Location [facility]  Navigator Encounter Type Appt/Treatment Plan Review  Patient Visit Type MedOnc  Treatment Phase Active Tx  Barriers/Navigation Needs Coordination of Care;Education;Language/Communication;Personal Conflicts  Interventions None Required  Acuity Level 4-High Needs (Greater Than 4 Barriers Identified)  Support Groups/Services Friends and Family  Time Spent with Patient 15

## 2021-10-27 ENCOUNTER — Other Ambulatory Visit: Payer: Self-pay

## 2021-10-28 ENCOUNTER — Encounter: Payer: Self-pay | Admitting: Hematology & Oncology

## 2021-10-28 ENCOUNTER — Telehealth: Payer: Self-pay | Admitting: *Deleted

## 2021-10-28 ENCOUNTER — Other Ambulatory Visit: Payer: Self-pay | Admitting: *Deleted

## 2021-10-28 ENCOUNTER — Other Ambulatory Visit: Payer: Self-pay

## 2021-10-28 ENCOUNTER — Encounter: Payer: Self-pay | Admitting: *Deleted

## 2021-10-28 ENCOUNTER — Other Ambulatory Visit (HOSPITAL_BASED_OUTPATIENT_CLINIC_OR_DEPARTMENT_OTHER): Payer: Self-pay

## 2021-10-28 ENCOUNTER — Inpatient Hospital Stay: Payer: Medicaid Other

## 2021-10-28 ENCOUNTER — Encounter: Payer: Medicaid Other | Admitting: Dietician

## 2021-10-28 ENCOUNTER — Inpatient Hospital Stay (HOSPITAL_BASED_OUTPATIENT_CLINIC_OR_DEPARTMENT_OTHER): Payer: Medicaid Other | Admitting: Hematology & Oncology

## 2021-10-28 VITALS — BP 105/65 | HR 65 | Resp 17

## 2021-10-28 DIAGNOSIS — C3492 Malignant neoplasm of unspecified part of left bronchus or lung: Secondary | ICD-10-CM | POA: Diagnosis present

## 2021-10-28 DIAGNOSIS — J90 Pleural effusion, not elsewhere classified: Secondary | ICD-10-CM | POA: Diagnosis not present

## 2021-10-28 DIAGNOSIS — Z79899 Other long term (current) drug therapy: Secondary | ICD-10-CM | POA: Diagnosis not present

## 2021-10-28 DIAGNOSIS — Z5112 Encounter for antineoplastic immunotherapy: Secondary | ICD-10-CM | POA: Diagnosis present

## 2021-10-28 LAB — CMP (CANCER CENTER ONLY)
ALT: 6 U/L (ref 0–44)
AST: 11 U/L — ABNORMAL LOW (ref 15–41)
Albumin: 3.8 g/dL (ref 3.5–5.0)
Alkaline Phosphatase: 83 U/L (ref 38–126)
Anion gap: 9 (ref 5–15)
BUN: 34 mg/dL — ABNORMAL HIGH (ref 8–23)
CO2: 27 mmol/L (ref 22–32)
Calcium: 14.6 mg/dL (ref 8.9–10.3)
Chloride: 99 mmol/L (ref 98–111)
Creatinine: 2.12 mg/dL — ABNORMAL HIGH (ref 0.61–1.24)
GFR, Estimated: 35 mL/min — ABNORMAL LOW (ref >60)
Glucose, Bld: 151 mg/dL — ABNORMAL HIGH (ref 70–99)
Potassium: 3.9 mmol/L (ref 3.5–5.1)
Sodium: 135 mmol/L (ref 135–145)
Total Bilirubin: 0.4 mg/dL (ref 0.3–1.2)
Total Protein: 8.5 g/dL — ABNORMAL HIGH (ref 6.5–8.1)

## 2021-10-28 LAB — CBC WITH DIFFERENTIAL (CANCER CENTER ONLY)
Abs Immature Granulocytes: 0.07 10*3/uL (ref 0.00–0.07)
Basophils Absolute: 0.1 10*3/uL (ref 0.0–0.1)
Basophils Relative: 1 %
Eosinophils Absolute: 0.4 10*3/uL (ref 0.0–0.5)
Eosinophils Relative: 3 %
HCT: 43.9 % (ref 39.0–52.0)
Hemoglobin: 13.5 g/dL (ref 13.0–17.0)
Immature Granulocytes: 1 %
Lymphocytes Relative: 12 %
Lymphs Abs: 1.8 10*3/uL (ref 0.7–4.0)
MCH: 26.7 pg (ref 26.0–34.0)
MCHC: 30.8 g/dL (ref 30.0–36.0)
MCV: 86.9 fL (ref 80.0–100.0)
Monocytes Absolute: 1.3 10*3/uL — ABNORMAL HIGH (ref 0.1–1.0)
Monocytes Relative: 9 %
Neutro Abs: 11.1 10*3/uL — ABNORMAL HIGH (ref 1.7–7.7)
Neutrophils Relative %: 74 %
Platelet Count: 465 10*3/uL — ABNORMAL HIGH (ref 150–400)
RBC: 5.05 MIL/uL (ref 4.22–5.81)
RDW: 12.5 % (ref 11.5–15.5)
WBC Count: 14.8 10*3/uL — ABNORMAL HIGH (ref 4.0–10.5)
nRBC: 0 % (ref 0.0–0.2)

## 2021-10-28 LAB — PREALBUMIN: Prealbumin: 17 mg/dL — ABNORMAL LOW (ref 18–38)

## 2021-10-28 LAB — LACTATE DEHYDROGENASE: LDH: 194 U/L — ABNORMAL HIGH (ref 98–192)

## 2021-10-28 LAB — TSH: TSH: 1.93 u[IU]/mL (ref 0.350–4.500)

## 2021-10-28 MED ORDER — NAPROXEN 500 MG PO TABS
500.0000 mg | ORAL_TABLET | Freq: Two times a day (BID) | ORAL | 4 refills | Status: DC
  Filled 2021-10-28: qty 60, 30d supply, fill #0

## 2021-10-28 MED ORDER — FAMOTIDINE IN NACL 20-0.9 MG/50ML-% IV SOLN
20.0000 mg | Freq: Once | INTRAVENOUS | Status: AC
  Administered 2021-10-28: 20 mg via INTRAVENOUS
  Filled 2021-10-28: qty 50

## 2021-10-28 MED ORDER — SODIUM CHLORIDE 0.9 % IV SOLN
1.0000 mg/kg | Freq: Once | INTRAVENOUS | Status: AC
  Administered 2021-10-28: 70 mg via INTRAVENOUS
  Filled 2021-10-28: qty 14

## 2021-10-28 MED ORDER — DIPHENHYDRAMINE HCL 50 MG/ML IJ SOLN
25.0000 mg | Freq: Once | INTRAMUSCULAR | Status: AC
  Administered 2021-10-28: 25 mg via INTRAVENOUS
  Filled 2021-10-28: qty 1

## 2021-10-28 MED ORDER — DRONABINOL 5 MG PO CAPS
5.0000 mg | ORAL_CAPSULE | Freq: Two times a day (BID) | ORAL | 0 refills | Status: DC
  Filled 2021-10-28 – 2021-11-01 (×2): qty 60, 30d supply, fill #0

## 2021-10-28 MED ORDER — SODIUM CHLORIDE 0.9 % IV SOLN: INTRAVENOUS | Status: DC

## 2021-10-28 MED ORDER — SODIUM CHLORIDE 0.9 % IV SOLN: Freq: Once | INTRAVENOUS | Status: DC

## 2021-10-28 MED ORDER — NAPROXEN 500 MG PO TABS
500.0000 mg | ORAL_TABLET | Freq: Two times a day (BID) | ORAL | 2 refills | Status: DC
  Filled 2021-10-28: qty 60, 30d supply, fill #0

## 2021-10-28 MED ORDER — ZOLEDRONIC ACID 4 MG/100ML IV SOLN
4.0000 mg | Freq: Once | INTRAVENOUS | Status: AC
  Administered 2021-10-28: 4 mg via INTRAVENOUS
  Filled 2021-10-28: qty 100

## 2021-10-28 MED ORDER — SODIUM CHLORIDE 0.9 % IV SOLN
200.0000 mg | Freq: Once | INTRAVENOUS | Status: AC
  Administered 2021-10-28: 200 mg via INTRAVENOUS
  Filled 2021-10-28: qty 20

## 2021-10-28 NOTE — Progress Notes (Signed)
Ipilimumab (YERVOY) Patient Monitoring Assessment   Is the patient experiencing any of the following general symptoms?:  [ Y]Difficulty performing normal activities [ Y]Feeling sluggish or cold all the time [N ]Unusual weight gain [ N]Constant or unusual headaches [Y ]Feeling dizzy or faint [ N]Changes in eyesight (blurry vision, double vision, or other vision problems) [N ]Changes in mood or behavior (ex: decreased sex drive, irritability, or forgetfulness) [ N]Starting new medications (ex: steroids, other medications that lower immune response) [ ] Patient is not experiencing any of the general symptoms above.   Gastrointestinal  Patient is having 1 bowel movements each day.  Is this different from baseline? [ ] Yes [ ] XNo Are your stools watery or do they have a foul smell? [ ] Yes [X ]No Have you seen blood in your stools? [ ] Yes [X ]No Are your stools dark, tarry, or sticky? [ ] Yes [ X]No Are you having pain or tenderness in your belly? [X ]Yes [ ] No  Skin Does your skin itch? [ ] Yes [ X]No Do you have a rash? [ ] Yes [X ]No Has your skin blistered and/or peeled? [ ] Yes [X ]No Do you have sores in your mouth? [ ] Yes [ X]No  Hepatic Has your urine been dark or tea colored? [ ] Yes [X ]No Have you noticed that your skin or the whites of your eyes are turning yellow? [ ] Yes [ X]No Are you bleeding or bruising more easily than normal? [ ] Yes [ X]No Are you nauseous and/or vomiting? [ ] Yes [ X]No Do you have pain on the right side of your stomach? [ ] Yes [X ]No  Neurologic  Are you having unusual weakness of legs, arms, or face? [X ]Yes [ ] No Are you having numbness or tingling in your hands or feet? [ ] Yes [ X]No  Melton Krebs

## 2021-10-28 NOTE — Progress Notes (Signed)
This is the patient had to leave before I could see him.  As such, this is not a visit for him.  He will be rescheduled.  Lucas Haw, MD Hematology and Oncology Follow Up Visit  Lucas Burke [id number] [birth year] 61 y.o. 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  He is lost some weight.  He says this is because he got sick.  Apparently, there has been a viral illness in the family.  He said he was having problems with nausea and vomiting for several days.  He is little bit dehydrated.  We will have to give him some IV fluid.  His renal function is down a little bit.  His calcium is quite high.  His calcium is 14.6.  He is not symptomatic with that.  Had to give him some Zometa today.  I know that he has had a hard time getting to see [place].  Because of this, we not be able to keep him on treatment schedule.  I do worry that he might be progressing.  Typically, the calcium is a good indicator for him for progression.  He has had no diarrhea.  He has had no shortness of breath.  He has had some chronic pain issues.  He has pain over on his left side where he has primary tumor.  He has had no bleeding.  He has had no leg swelling.  There has been no headache.  Overall, I would say his performance status is probably ECOG 2.    Medications:  Allergies as of 10/28/2021       Reactions   Codeine Nausea Only        Medication List        Accurate as of October 28, 2021 11:43 AM. If you have any questions, ask your nurse or doctor.          acetaminophen 325 MG tablet Commonly known as: TYLENOL Take 650 mg by mouth every 6 (six) hours as needed.   ALPRAZolam 0.25 MG tablet Commonly known  as: XANAX Take 1 tablet (0.25 mg total) by mouth 3 (three) times daily as needed for anxiety.   alum & mag hydroxide-simeth 200-200-20 MG/5ML suspension Commonly known as: MAALOX/MYLANTA Take 30 mLs by mouth every 4 (four) hours as needed for indigestion.   cefdinir 300 MG capsule Commonly known as: OMNICEF Take 2 capsules (600 mg total) by mouth daily.   dronabinol 5 MG capsule Commonly known as: MARINOL Take 1 capsule (5 mg total) by mouth 2 (two) times daily before a meal. Started by: Volanda Napoleon, MD   feeding supplement Liqd Take 237 mLs by mouth 3 (three) times daily between meals.   gabapentin 300 MG capsule Commonly known as: NEURONTIN TAKE 1 CAPSULE(300 MG) BY MOUTH AT BEDTIME   midodrine 5 MG tablet Commonly known as: PROAMATINE Take 1 tablet (5 mg total) by mouth 3 (three) times daily with meals.   multivitamin with minerals Tabs tablet Take 1 tablet by mouth daily.   naproxen 500 MG tablet Commonly known as: NAPROSYN Take 1 tablet (500 mg total) by mouth 2 (two) times daily with a meal.   ondansetron  4 MG tablet Commonly known as: ZOFRAN Take 1 tablet (4 mg total) by mouth every 6 (six) hours as needed for nausea.   oxyCODONE 5 MG immediate release tablet Commonly known as: Oxy IR/ROXICODONE Take 1 tablet (5 mg total) by mouth every 4 (four) hours as needed for severe pain.   pantoprazole 40 MG tablet Commonly known as: PROTONIX Take 1 tablet (40 mg total) by mouth daily.   PARoxetine 20 MG tablet Commonly known as: PAXIL Take 1 tablet by mouth daily.   polyethylene glycol 17 g packet Commonly known as: MiraLax Take 17 g by mouth daily.        Allergies:  Allergies  Allergen Reactions   Codeine Nausea Only    Past Medical History, Surgical history, Social history, and Family History were reviewed and updated.  Review of Systems: Review of Systems  Constitutional: Negative.   HENT: Negative.    Eyes: Negative.   Respiratory:   Positive for cough and sputum production.   Cardiovascular: Negative.   Gastrointestinal: Negative.   Genitourinary: Negative.   Musculoskeletal: Negative.   Skin: Negative.   Neurological:  Positive for tingling.  Endo/Heme/Allergies: Negative.   Psychiatric/Behavioral: Negative.  Negative for depression.      Physical Exam:  height is 6' 0.05" (1.83 m) and weight is 149 lb 0.6 oz (67.6 kg). His oral temperature is 97.6 F (36.4 C). His blood pressure is 96/70 and his pulse is 73. His respiration is 20 and oxygen saturation is 98%.   Wt Readings from Last 3 Encounters:  10/28/21 149 lb 0.6 oz (67.6 kg)  09/28/21 158 lb (71.7 kg)  09/20/21 154 lb (69.9 kg)   Physical Exam Vitals reviewed.  HENT:     Head: Normocephalic and atraumatic.  Eyes:     Pupils: Pupils are equal, round, and reactive to light.  Cardiovascular:     Rate and Rhythm: Normal rate and regular rhythm.     Heart sounds: Normal heart sounds.  Pulmonary:     Effort: Pulmonary effort is normal.     Breath sounds: Normal breath sounds.  Abdominal:     General: Bowel sounds are normal.     Palpations: Abdomen is soft.  Musculoskeletal:        General: No tenderness or deformity. Normal range of motion.     Cervical back: Normal range of motion.  Lymphadenopathy:     Cervical: No cervical adenopathy.  Skin:    General: Skin is warm and dry.     Findings: No erythema or rash.  Neurological:     Mental Status: He is alert and oriented to person, place, and time.  Psychiatric:        Behavior: Behavior normal.        Thought Content: Thought content normal.        Judgment: Judgment normal.      Lab Results  Component Value Date   WBC 14.8 (H) 10/28/2021   HGB 13.5 10/28/2021   HCT 43.9 10/28/2021   MCV 86.9 10/28/2021   PLT 465 (H) 10/28/2021   Lab Results  Component Value Date   FERRITIN 637 (H) 05/25/2021   IRON 14 (L) 05/25/2021   TIBC 115 (L) 05/25/2021   UIBC 101 05/25/2021    IRONPCTSAT 12 (L) 05/25/2021   Lab Results  Component Value Date   RBC 5.05 10/28/2021   No results found for: "KPAFRELGTCHN", "LAMBDASER", "KAPLAMBRATIO" No results found for: "IGGSERUM", "IGA", "IGMSERUM" No results found for: "TOTALPROTELP", "ALBUMINELP", "  A1GS", "A2GS", "BETS", "BETA2SER", "GAMS", "MSPIKE", "SPEI"   Chemistry      Component Value Date/Time   NA 135 10/28/2021 1006   NA 141 05/23/2019 1542   K 3.9 10/28/2021 1006   CL 99 10/28/2021 1006   CO2 27 10/28/2021 1006   BUN 34 (H) 10/28/2021 1006   BUN 16 05/23/2019 1542   CREATININE 2.12 (H) 10/28/2021 1006      Component Value Date/Time   CALCIUM 14.6 (HH) 10/28/2021 1006   ALKPHOS 83 10/28/2021 1006   AST 11 (L) 10/28/2021 1006   ALT 6 10/28/2021 1006   BILITOT 0.4 10/28/2021 1006       Impression and Plan: Mr. 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   Again, I am little troubled by the calcium.  I do worry that he might be progressing.  Again, we have not been able to really keep him on schedule because of transportation issues.  I will have set him up with a CT scan to see how things look.  [facility] can get [facility] at [facility].  He lives in the [place] area so doing the CT scan [place] would be a lot easier.  I will try to get this set up in about 4 weeks.  I would like to get this set up after he has the third cycle of treatment.  If we do find that he is progressing, then we will get have to talk to him about actual chemotherapy.  I know he is try to avoid this.  He will get his Zometa for the hypercalcemia.  We will give him some IV fluid.  Hopefully, just hydrating him back up will help the hypercalcemia.   Volanda Napoleon, MD 8/24/202311:43 AM

## 2021-10-28 NOTE — Progress Notes (Signed)
Dr. Marin Olp notified of creat-2.12 and calcium-14.6  Order received for pt to get xgeva or zometa today and also an ok to treat with Opdivo and Yervoy today per order of Dr. Marin Olp.

## 2021-10-28 NOTE — Telephone Encounter (Signed)
Dr. Marin Olp notified of Calcium-14.6.  Order received for pt to get Xgeva or Zometa today.

## 2021-10-28 NOTE — Patient Instructions (Signed)
Lucas Burke [facility]  Discharge Instructions: Thank you for choosing [facility] to provide your oncology and hematology care.   If you have a lab appointment with the [facility], please go directly to the [facility] and check in at the registration area.  Wear comfortable clothing and clothing appropriate for easy access to any Portacath or PICC line.   We strive to give you quality time with your provider. You may need to reschedule your appointment if you arrive late (15 or more minutes).  Arriving late affects you and other patients whose appointments are after yours.  Also, if you miss three or more appointments without notifying the office, you may be dismissed from the clinic at the provider's discretion.      For prescription refill requests, have your pharmacy contact our office and allow 72 hours for refills to be completed.    Today you received the following chemotherapy and/or immunotherapy agents Opdivo, Yervoy.      To help prevent nausea and vomiting after your treatment, we encourage you to take your nausea medication as directed.  BELOW ARE SYMPTOMS THAT SHOULD BE REPORTED IMMEDIATELY: *FEVER GREATER THAN 100.4 F (38 C) OR HIGHER *CHILLS OR SWEATING *NAUSEA AND VOMITING THAT IS NOT CONTROLLED WITH YOUR NAUSEA MEDICATION *UNUSUAL SHORTNESS OF BREATH *UNUSUAL BRUISING OR BLEEDING *URINARY PROBLEMS (pain or burning when urinating, or frequent urination) *BOWEL PROBLEMS (unusual diarrhea, constipation, pain near the anus) TENDERNESS IN MOUTH AND THROAT WITH OR WITHOUT PRESENCE OF ULCERS (sore throat, sores in mouth, or a toothache) UNUSUAL RASH, SWELLING OR PAIN  UNUSUAL VAGINAL DISCHARGE OR ITCHING   Items with * indicate a potential emergency and should be followed up as soon as possible or go to the Emergency Department if any problems should occur.  Please show the CHEMOTHERAPY ALERT CARD or IMMUNOTHERAPY ALERT CARD at check-in to  the Emergency Department and triage nurse. Should you have questions after your visit or need to cancel or reschedule your appointment, please contact [facility]  [phone] and follow the prompts.  Office hours are 8:00 a.m. to 4:30 p.m. Monday - Friday. Please note that voicemails left after 4:00 p.m. may not be returned until the following business day.  We are closed weekends and major holidays. You have access to a nurse at all times for urgent questions. Please call the main number to the clinic [phone] and follow the prompts.  For any non-urgent questions, you may also contact your provider using MyChart. We now offer e-Visits for anyone 24 and older to request care online for non-urgent symptoms. For details visit [email or web address].   Also download the MyChart app! Go to the app store, search "MyChart", open the app, select [name], and log in with your MyChart username and password.  Masks are optional in the cancer centers. If you would like for your care team to wear a mask while they are taking care of you, please let them know. You may have one support person who is at least 63 years old accompany you for your appointments.

## 2021-10-28 NOTE — Progress Notes (Signed)
Patient needs a CT scan prior to his next appointment. Scheduled for 11/29/2021.  Reviewed with patient in the infusion room. He is aware of appointment time, date and location. He knows he will need to pick up oral contrast prior to his appointment. He knows to be NPO for 4 hours. Radiology info sheet with above information reviewed and given to patient.   Oncology Nurse Navigator Documentation     10/28/2021   10:30 AM  Oncology Nurse Navigator Flowsheets  Navigator Follow Up Date: 11/29/2021  Navigator Follow Up Reason: Scan Review  Navigator Location [facility]  Navigator Encounter Type Treatment;Appt/Treatment Plan Review  Patient Visit Type MedOnc  Treatment Phase Active Tx  Barriers/Navigation Needs Coordination of Care;Education;Language/Communication;Personal Conflicts  Education Other  Interventions Coordination of Care;Education;Psycho-Social Support  Acuity Level 4-High Needs (Greater Than 4 Barriers Identified)  Coordination of Care Radiology  Education Method Verbal;Written  Support Groups/Services Friends and Family  Time Spent with Patient 30

## 2021-10-29 ENCOUNTER — Other Ambulatory Visit: Payer: Self-pay

## 2021-10-29 ENCOUNTER — Telehealth: Payer: Self-pay | Admitting: *Deleted

## 2021-10-29 ENCOUNTER — Telehealth: Payer: Self-pay

## 2021-10-29 ENCOUNTER — Other Ambulatory Visit (HOSPITAL_BASED_OUTPATIENT_CLINIC_OR_DEPARTMENT_OTHER): Payer: Self-pay

## 2021-10-29 NOTE — Telephone Encounter (Signed)
Message received from patient wanting to know if the Marinol has been approved yet.  Call placed back to patient and patient notified that the Marinol was denied by his insurance and that Dr Marin Olp would like for an appeal to be started which we will attempt to do for him.  Pt is appreciative of call back and has no further questions or concerns at this time.

## 2021-10-29 NOTE — Telephone Encounter (Signed)
CSW attempted to contact patient to assess needs.  Left vm.

## 2021-10-30 ENCOUNTER — Other Ambulatory Visit: Payer: Self-pay

## 2021-10-30 ENCOUNTER — Other Ambulatory Visit: Payer: Self-pay | Admitting: Hematology & Oncology

## 2021-10-30 DIAGNOSIS — M792 Neuralgia and neuritis, unspecified: Secondary | ICD-10-CM

## 2021-10-30 DIAGNOSIS — C3492 Malignant neoplasm of unspecified part of left bronchus or lung: Secondary | ICD-10-CM

## 2021-10-30 LAB — T4: T4, Total: 8 ug/dL (ref 4.5–12.0)

## 2021-10-31 ENCOUNTER — Other Ambulatory Visit: Payer: Self-pay

## 2021-11-01 ENCOUNTER — Other Ambulatory Visit (HOSPITAL_BASED_OUTPATIENT_CLINIC_OR_DEPARTMENT_OTHER): Payer: Self-pay

## 2021-11-01 ENCOUNTER — Encounter: Payer: Self-pay | Admitting: Hematology & Oncology

## 2021-11-01 ENCOUNTER — Other Ambulatory Visit: Payer: Self-pay | Admitting: *Deleted

## 2021-11-02 ENCOUNTER — Other Ambulatory Visit: Payer: Self-pay

## 2021-11-02 MED ORDER — MAGIC MOUTHWASH W/LIDOCAINE: 5.0000 mL | Freq: Four times a day (QID) | ORAL | 0 refills | Status: DC | PRN

## 2021-11-02 NOTE — Telephone Encounter (Signed)
Patient called stating he has a sore throat and is asking for something to help relief the pain, he has tried over the counter medications and they ahvent helped. States it started after treatment last week, had a cough the following day after treatment and then turned into a sore throat. Verbal order for magic mouthwash received. Printed and faxed to patients pharmacy.

## 2021-11-03 ENCOUNTER — Inpatient Hospital Stay: Payer: Medicaid Other | Admitting: Dietician

## 2021-11-03 ENCOUNTER — Encounter: Payer: Self-pay | Admitting: *Deleted

## 2021-11-03 ENCOUNTER — Other Ambulatory Visit: Payer: Self-pay

## 2021-11-03 ENCOUNTER — Other Ambulatory Visit (HOSPITAL_BASED_OUTPATIENT_CLINIC_OR_DEPARTMENT_OTHER): Payer: Self-pay

## 2021-11-03 MED ORDER — DRONABINOL 5 MG PO CAPS: 5.0000 mg | ORAL_CAPSULE | Freq: Two times a day (BID) | ORAL | 0 refills | Status: DC

## 2021-11-03 NOTE — Progress Notes (Signed)
Approval received for Dronabinol 5 mg by [facility] plan from 11/02/21-11/03/22.  Reference # [id number].  Pharmacy aware.

## 2021-11-03 NOTE — Progress Notes (Signed)
Called patient on telephone for scheduled nutrition follow up.  He reports he has been very sick with prolong coughing, severe sore throat where he couldn't swallow.  States "About to throw in the towel, considering stopping treatment."  He states he's not suicidal but considering living out his days without continuing any more treatment.    He reports he can't drink water.  His sister brought him a few Ensure that he tried to swallow and he can get some down.  He states the prescription for MMW wasn't filled because the [facility] has no record of it in their system.   Reached out to RD at [facility] to see if she has any ONS to give.  She has case of Ensure Chocolate.  Patient agreed to going to  [place] to pick up.  RN reached out to [employer] and they assured her that they have everything necessary for MMW and Marinol Rx.  Anthropometrics: lost 9# past month  Height: 72" Weight:  10/28/21  149# 09/28/21  158# 09/20/21  154# 09/06/21    157# 08/23/21  157# 6/5  158# UBW: 175# BMI: 20.89   Will follow up remotely next week.  April Manson, RDN, LDN Registered Dietitian, [place] Part Time Remote (Usual office hours: Tuesday-Thursday) Mobile: [phone] Remote Office: [phone]

## 2021-11-05 ENCOUNTER — Other Ambulatory Visit: Payer: Self-pay | Admitting: *Deleted

## 2021-11-05 ENCOUNTER — Other Ambulatory Visit: Payer: Self-pay

## 2021-11-05 ENCOUNTER — Telehealth: Payer: Self-pay | Admitting: Licensed Clinical Social Worker

## 2021-11-05 DIAGNOSIS — J90 Pleural effusion, not elsewhere classified: Secondary | ICD-10-CM

## 2021-11-05 DIAGNOSIS — C3492 Malignant neoplasm of unspecified part of left bronchus or lung: Secondary | ICD-10-CM

## 2021-11-05 NOTE — Telephone Encounter (Signed)
[place] Work  Clinical Social Work was referred by nurse for pt's questions on home assistance.  Clinical Social Worker attempted to contact patient by phone  to offer support and assess for needs.  No answer. Left VM with direct contact information.  Pt does have a personal care services benefit through his insurance that he may be able to access.      Canaan Holzer E Ahman Dugdale, LCSW  Clinical Social Worker [place]        Patient is participating in a Managed Medicaid Plan:  Yes

## 2021-11-10 ENCOUNTER — Telehealth: Payer: Self-pay | Admitting: Licensed Clinical Social Worker

## 2021-11-10 NOTE — Telephone Encounter (Signed)
[place] Work  2nd attempt to reach pt by phone to discuss home assistance needs. No answer. Left VM with direct contact information.   Irelynn Schermerhorn E Kayton Ripp, LCSW

## 2021-11-11 ENCOUNTER — Inpatient Hospital Stay: Payer: Medicaid Other | Attending: Hematology & Oncology

## 2021-11-11 ENCOUNTER — Inpatient Hospital Stay: Payer: Medicaid Other

## 2021-11-11 ENCOUNTER — Inpatient Hospital Stay: Payer: Medicaid Other | Admitting: Dietician

## 2021-11-11 ENCOUNTER — Encounter: Payer: Medicaid Other | Admitting: Dietician

## 2021-11-11 NOTE — Progress Notes (Signed)
Attempted to reach patient by telephone for nutrition follow up, [facility] on voice mail. RD at [facility] relayed that he did not pick up his case of Ensure last week.    April Manson, RDN, LDN Registered Dietitian, [place] Part Time Remote (Usual office hours: Tuesday-Thursday) Mobile: [phone] Remote Office: [phone]

## 2021-11-22 ENCOUNTER — Other Ambulatory Visit: Payer: Self-pay | Admitting: *Deleted

## 2021-11-22 DIAGNOSIS — J90 Pleural effusion, not elsewhere classified: Secondary | ICD-10-CM

## 2021-11-22 DIAGNOSIS — C3492 Malignant neoplasm of unspecified part of left bronchus or lung: Secondary | ICD-10-CM

## 2021-11-22 DIAGNOSIS — K5900 Constipation, unspecified: Secondary | ICD-10-CM

## 2021-11-22 MED ORDER — OXYCODONE HCL 5 MG PO TABS: 5.0000 mg | ORAL_TABLET | ORAL | 0 refills | Status: AC | PRN

## 2021-11-22 MED ORDER — MIDODRINE HCL 5 MG PO TABS: 5.0000 mg | ORAL_TABLET | Freq: Three times a day (TID) | ORAL | 6 refills | Status: DC

## 2021-11-22 MED ORDER — POLYETHYLENE GLYCOL 3350 17 G PO PACK: 17.0000 g | PACK | Freq: Every day | ORAL | 6 refills | Status: DC

## 2021-11-25 ENCOUNTER — Inpatient Hospital Stay: Payer: Medicaid Other

## 2021-11-26 ENCOUNTER — Encounter: Payer: Self-pay | Admitting: *Deleted

## 2021-11-26 NOTE — Progress Notes (Signed)
Patient has rescheduled his treatment multiple times. He is now due for his CT scan on Monday. The information related to his CT was provided to him in person while he was in the infusion room.  Attempted to call patient to remind him of scan and to review prep information. Voicemail left.  Attempted to call his daughter, [name]. Voicemail left.   Oncology Nurse Navigator Documentation     11/26/2021   10:00 AM  Oncology Nurse Navigator Flowsheets  Navigator Follow Up Date: 11/29/2021  Navigator Follow Up Reason: Scan Review  Navigator Location [facility]  Navigator Encounter Type Telephone  Telephone Outgoing Call  Patient Visit Type MedOnc  Treatment Phase Active Tx  Barriers/Navigation Needs Coordination of Care;Education;Language/Communication;Personal Conflicts  Interventions Education  Acuity Level 4-High Needs (Greater Than 4 Barriers Identified)  Support Groups/Services Friends and Family  Time Spent with Patient 15

## 2021-11-29 ENCOUNTER — Ambulatory Visit: Admission: RE | Admit: 2021-11-29 | Payer: Medicaid Other | Source: Ambulatory Visit

## 2021-11-30 ENCOUNTER — Inpatient Hospital Stay: Payer: Medicaid Other

## 2021-11-30 ENCOUNTER — Encounter: Payer: Self-pay | Admitting: *Deleted

## 2021-11-30 ENCOUNTER — Other Ambulatory Visit: Payer: Self-pay | Admitting: Hematology & Oncology

## 2021-11-30 DIAGNOSIS — M792 Neuralgia and neuritis, unspecified: Secondary | ICD-10-CM

## 2021-11-30 DIAGNOSIS — C3492 Malignant neoplasm of unspecified part of left bronchus or lung: Secondary | ICD-10-CM

## 2021-11-30 NOTE — Progress Notes (Signed)
Patient was a no-show for his CT scan yesterday, and his lab/treatment appointment today.   Oncology Nurse Navigator Documentation     11/30/2021   12:30 PM  Oncology Nurse Navigator Flowsheets  Navigator Location [facility]  Navigator Encounter Type Appt/Treatment Plan Review  Patient Visit Type MedOnc  Treatment Phase Active Tx  Barriers/Navigation Needs Coordination of Care;Education;Language/Communication;Personal Conflicts  Acuity Level 4-High Needs (Greater Than 4 Barriers Identified)  Support Groups/Services Friends and Family  Time Spent with Patient 15

## 2021-12-03 ENCOUNTER — Emergency Department: Payer: Medicaid Other

## 2021-12-03 ENCOUNTER — Inpatient Hospital Stay
Admission: EM | Admit: 2021-12-03 | Discharge: 2021-12-07 | DRG: 640 | Disposition: A | Discharge status: Home / Self Care | Payer: Medicaid Other | Attending: Internal Medicine | Admitting: Internal Medicine

## 2021-12-03 ENCOUNTER — Other Ambulatory Visit: Payer: Self-pay

## 2021-12-03 DIAGNOSIS — C799 Secondary malignant neoplasm of unspecified site: Secondary | ICD-10-CM | POA: Diagnosis present

## 2021-12-03 DIAGNOSIS — G8929 Other chronic pain: Secondary | ICD-10-CM | POA: Diagnosis present

## 2021-12-03 DIAGNOSIS — Z885 Allergy status to narcotic agent status: Secondary | ICD-10-CM

## 2021-12-03 DIAGNOSIS — J189 Pneumonia, unspecified organism: Secondary | ICD-10-CM | POA: Diagnosis present

## 2021-12-03 DIAGNOSIS — Z87891 Personal history of nicotine dependence: Secondary | ICD-10-CM

## 2021-12-03 DIAGNOSIS — E872 Acidosis, unspecified: Secondary | ICD-10-CM | POA: Insufficient documentation

## 2021-12-03 DIAGNOSIS — Z681 Body mass index (BMI) 19 or less, adult: Secondary | ICD-10-CM

## 2021-12-03 DIAGNOSIS — F410 Panic disorder [episodic paroxysmal anxiety] without agoraphobia: Secondary | ICD-10-CM | POA: Diagnosis present

## 2021-12-03 DIAGNOSIS — N179 Acute kidney failure, unspecified: Secondary | ICD-10-CM

## 2021-12-03 DIAGNOSIS — E860 Dehydration: Secondary | ICD-10-CM | POA: Diagnosis present

## 2021-12-03 DIAGNOSIS — M549 Dorsalgia, unspecified: Secondary | ICD-10-CM | POA: Diagnosis present

## 2021-12-03 DIAGNOSIS — A419 Sepsis, unspecified organism: Secondary | ICD-10-CM | POA: Diagnosis present

## 2021-12-03 DIAGNOSIS — R296 Repeated falls: Secondary | ICD-10-CM

## 2021-12-03 DIAGNOSIS — J9601 Acute respiratory failure with hypoxia: Secondary | ICD-10-CM

## 2021-12-03 DIAGNOSIS — E43 Unspecified severe protein-calorie malnutrition: Secondary | ICD-10-CM | POA: Insufficient documentation

## 2021-12-03 DIAGNOSIS — R42 Dizziness and giddiness: Secondary | ICD-10-CM

## 2021-12-03 DIAGNOSIS — F32A Depression, unspecified: Secondary | ICD-10-CM

## 2021-12-03 DIAGNOSIS — E46 Unspecified protein-calorie malnutrition: Secondary | ICD-10-CM

## 2021-12-03 DIAGNOSIS — J910 Malignant pleural effusion: Secondary | ICD-10-CM

## 2021-12-03 DIAGNOSIS — C3492 Malignant neoplasm of unspecified part of left bronchus or lung: Secondary | ICD-10-CM | POA: Diagnosis present

## 2021-12-03 DIAGNOSIS — G9341 Metabolic encephalopathy: Secondary | ICD-10-CM

## 2021-12-03 DIAGNOSIS — G319 Degenerative disease of nervous system, unspecified: Secondary | ICD-10-CM | POA: Diagnosis present

## 2021-12-03 DIAGNOSIS — R64 Cachexia: Secondary | ICD-10-CM | POA: Diagnosis present

## 2021-12-03 DIAGNOSIS — Z79899 Other long term (current) drug therapy: Secondary | ICD-10-CM

## 2021-12-03 LAB — CBC WITH DIFFERENTIAL/PLATELET
Abs Immature Granulocytes: 0.07 10*3/uL (ref 0.00–0.07)
Basophils Absolute: 0.1 10*3/uL (ref 0.0–0.1)
Basophils Relative: 0 %
Eosinophils Absolute: 0.3 10*3/uL (ref 0.0–0.5)
Eosinophils Relative: 2 %
HCT: 41.5 % (ref 39.0–52.0)
Hemoglobin: 12.8 g/dL — ABNORMAL LOW (ref 13.0–17.0)
Immature Granulocytes: 0 %
Lymphocytes Relative: 9 %
Lymphs Abs: 1.5 10*3/uL (ref 0.7–4.0)
MCH: 26.5 pg (ref 26.0–34.0)
MCHC: 30.8 g/dL (ref 30.0–36.0)
MCV: 85.9 fL (ref 80.0–100.0)
Monocytes Absolute: 1.6 10*3/uL — ABNORMAL HIGH (ref 0.1–1.0)
Monocytes Relative: 10 %
Neutro Abs: 12.5 10*3/uL — ABNORMAL HIGH (ref 1.7–7.7)
Neutrophils Relative %: 79 %
Platelets: 474 10*3/uL — ABNORMAL HIGH (ref 150–400)
RBC: 4.83 MIL/uL (ref 4.22–5.81)
RDW: 13 % (ref 11.5–15.5)
WBC: 15.9 10*3/uL — ABNORMAL HIGH (ref 4.0–10.5)
nRBC: 0 % (ref 0.0–0.2)

## 2021-12-03 LAB — COMPREHENSIVE METABOLIC PANEL
ALT: 10 U/L (ref 0–44)
AST: 23 U/L (ref 15–41)
Albumin: 3.1 g/dL — ABNORMAL LOW (ref 3.5–5.0)
Alkaline Phosphatase: 93 U/L (ref 38–126)
Anion gap: 12 (ref 5–15)
BUN: 41 mg/dL — ABNORMAL HIGH (ref 8–23)
CO2: 27 mmol/L (ref 22–32)
Calcium: >15 mg/dL (ref 8.9–10.3)
Chloride: 97 mmol/L — ABNORMAL LOW (ref 98–111)
Creatinine, Ser: 2.37 mg/dL — ABNORMAL HIGH (ref 0.61–1.24)
GFR, Estimated: 30 mL/min — ABNORMAL LOW (ref >60)
Glucose, Bld: 135 mg/dL — ABNORMAL HIGH (ref 70–99)
Potassium: 3.2 mmol/L — ABNORMAL LOW (ref 3.5–5.1)
Sodium: 136 mmol/L (ref 135–145)
Total Bilirubin: 0.6 mg/dL (ref 0.3–1.2)
Total Protein: 8.1 g/dL (ref 6.5–8.1)

## 2021-12-03 LAB — LACTIC ACID, PLASMA: Lactic Acid, Venous: 2.8 mmol/L (ref 0.5–1.9)

## 2021-12-03 LAB — TROPONIN I (HIGH SENSITIVITY): Troponin I (High Sensitivity): 73 ng/L — ABNORMAL HIGH (ref <18)

## 2021-12-03 MED ORDER — LACTATED RINGERS IV BOLUS
1000.0000 mL | Freq: Once | INTRAVENOUS | Status: AC
  Administered 2021-12-03: 1000 mL via INTRAVENOUS

## 2021-12-03 MED ORDER — CALCITONIN (SALMON) 200 UNIT/ML IJ SOLN
4.0000 [IU]/kg | Freq: Two times a day (BID) | INTRAMUSCULAR | Status: AC
  Administered 2021-12-03 – 2021-12-05 (×4): 264 [IU] via INTRAMUSCULAR
  Filled 2021-12-03 (×4): qty 1.32

## 2021-12-03 MED ORDER — ZOLEDRONIC ACID 4 MG/5ML IV CONC
4.0000 mg | Freq: Once | INTRAVENOUS | Status: AC
  Administered 2021-12-04: 4 mg via INTRAVENOUS
  Filled 2021-12-03: qty 5

## 2021-12-03 MED ORDER — SODIUM CHLORIDE 0.9 % IV BOLUS
1000.0000 mL | Freq: Once | INTRAVENOUS | Status: AC
  Administered 2021-12-03: 1000 mL via INTRAVENOUS

## 2021-12-03 NOTE — ED Notes (Signed)
Pt placed on 2L [place]

## 2021-12-03 NOTE — ED Provider Notes (Signed)
[facility] Provider Note    Event Date/Time   First MD Initiated Contact with Patient 12/03/21 2224     (approximate)   History   Lightheadedness  HPI  Lucas Burke is a 61 y.o. male with history of lung cancer who presents to the emergency department today because concerns for lightheadedness.  Patient states symptoms started today.  Felt like when he was getting up that he was going to pass out did have some heart racing when this happened.  The patient denies similar symptoms in the past.  States that he has not had a good appetite and has had decreased oral intake recently.  Denies any recent fevers.      Physical Exam   Triage Vital Signs: ED Triage Vitals [12/03/21 2202]  Enc Vitals Group     BP 90/70     Pulse Rate 65     Resp 18     Temp 98.1 F (36.7 C)     Temp src      SpO2 (!) 89 %     Weight 145 lb (65.8 kg)     Height      Head Circumference      Peak Flow      Pain Score 0     Pain Loc      Pain Edu?      Excl. in [place]?     Most recent vital signs: Vitals:   12/03/21 2202  BP: 90/70  Pulse: 65  Resp: 18  Temp: 98.1 F (36.7 C)  SpO2: (!) 89%    General: Awake, alert, oriented. CV:  Good peripheral perfusion. Regular rate and rhythm. Resp:  Normal effort. Lungs clear. Abd:  No distention. Non tender.    ED Results / Procedures / Treatments   Labs (all labs ordered are listed, but only abnormal results are displayed) Labs Reviewed  COMPREHENSIVE METABOLIC PANEL - Abnormal; Notable for the following components:      Result Value   Potassium 3.2 (*)    Chloride 97 (*)    Glucose, Bld 135 (*)    BUN 41 (*)    Creatinine, Ser 2.37 (*)    Calcium >15.0 (*)    Albumin 3.1 (*)    GFR, Estimated 30 (*)    All other components within normal limits  CBC WITH DIFFERENTIAL/PLATELET - Abnormal; Notable for the following components:   WBC 15.9 (*)    Hemoglobin 12.8 (*)    Platelets 474 (*)    Neutro Abs 12.5 (*)     Monocytes Absolute 1.6 (*)    All other components within normal limits  TROPONIN I (HIGH SENSITIVITY) - Abnormal; Notable for the following components:   Troponin I (High Sensitivity) 73 (*)    All other components within normal limits  CULTURE, BLOOD (ROUTINE X 2)  CULTURE, BLOOD (ROUTINE X 2)  LACTIC ACID, PLASMA  LACTIC ACID, PLASMA  URINALYSIS, ROUTINE W REFLEX MICROSCOPIC     EKG  I, Nance Pear, attending physician, personally viewed and interpreted this EKG  EKG Time: 2214 Rate: 79 Rhythm: sinus rhythm Axis: left axis deviation Intervals: qtc 410 QRS: narrow ST changes: no st elevation Impression: abnormal ekg   RADIOLOGY I independently interpreted and visualized the CXR. My interpretation: White out of left hemithroax Radiology interpretation:  IMPRESSION:  1. Right-sided pulmonary vascular congestion.  2. Unchanged complete opacification of the left hemithorax.    PROCEDURES:  Critical Care performed: Yes, see critical  care procedure note(s)  Procedures  CRITICAL CARE Performed by: Nance Pear   Total critical care time: 30 minutes  Critical care time was exclusive of separately billable procedures and treating other patients.  Critical care was necessary to treat or prevent imminent or life-threatening deterioration.  Critical care was time spent personally by me on the following activities: development of treatment plan with patient and/or surrogate as well as nursing, discussions with consultants, evaluation of patient's response to treatment, examination of patient, obtaining history from patient or surrogate, ordering and performing treatments and interventions, ordering and review of laboratory studies, ordering and review of radiographic studies, pulse oximetry and re-evaluation of patient's condition.   MEDICATIONS ORDERED IN ED: Medications  lactated ringers bolus 1,000 mL (has no administration in time range)     IMPRESSION  / MDM / ASSESSMENT AND PLAN / ED COURSE  I reviewed the triage vital signs and the nursing notes.                              Differential diagnosis includes, but is not limited to, anemia, electrolyte abnormality, intracranial process.   Patient's presentation is most consistent with acute presentation with potential threat to life or bodily function.   Patient presented to the emergency department today because of concerns for lightheadedness.  Patient does have history of stage IV lung cancer.  Patient was found to be hypoxic on room air.  Broad work-up was initiated.  Patient's blood work without any concerning anemia.  Slight leukocytosis.  Patient's chemistry concerning for significantly elevated calcium.  Because of this he was started on IV fluids, calcitonin and bisphosphonate.  Additionally troponin was slightly elevated as well.  Given elevation in troponin and proxy did have concern for possible pulmonary embolism.  Because of this CT angio was obtained.  Awaiting further results at time of signout.  FINAL CLINICAL IMPRESSION(S) / ED DIAGNOSES   Final diagnoses:  Hypercalcemia  Lightheadedness  Acute respiratory failure with hypoxia ([place])     Note:  This document was prepared using Dragon voice recognition software and may include unintentional dictation errors.    Nance Pear, MD 12/03/21 [phone]

## 2021-12-03 NOTE — ED Triage Notes (Signed)
Pt via [facility] from home after pt's daughter called to report that he had a syncopal episode.  A/O x 3 on arrival. Pt believes it is 1985. Stage IV cancer but unsure of what type per EMS report. Pt reports dizziness and lightheadedness and "feels like crap."   BP 109/72 HR 74 O2 100% room air EKG unremarkable

## 2021-12-03 NOTE — ED Triage Notes (Signed)
PT coming from home with [facility] for Dizziness starting tonight. PT arrives alert and oriented to self, place and situation but not year at this time.  Pt stating they feel as though they are about to pass out.   Pt endorsing Stage 4 lung cancer. Pt ot sat 88 on RA in triage.

## 2021-12-04 ENCOUNTER — Inpatient Hospital Stay: Payer: Medicaid Other

## 2021-12-04 DIAGNOSIS — J9601 Acute respiratory failure with hypoxia: Secondary | ICD-10-CM | POA: Diagnosis present

## 2021-12-04 DIAGNOSIS — J189 Pneumonia, unspecified organism: Secondary | ICD-10-CM | POA: Diagnosis present

## 2021-12-04 DIAGNOSIS — F32A Depression, unspecified: Secondary | ICD-10-CM | POA: Diagnosis present

## 2021-12-04 DIAGNOSIS — R6520 Severe sepsis without septic shock: Secondary | ICD-10-CM

## 2021-12-04 DIAGNOSIS — R296 Repeated falls: Secondary | ICD-10-CM

## 2021-12-04 DIAGNOSIS — E43 Unspecified severe protein-calorie malnutrition: Secondary | ICD-10-CM | POA: Diagnosis present

## 2021-12-04 DIAGNOSIS — A419 Sepsis, unspecified organism: Secondary | ICD-10-CM

## 2021-12-04 DIAGNOSIS — G9341 Metabolic encephalopathy: Secondary | ICD-10-CM

## 2021-12-04 DIAGNOSIS — J910 Malignant pleural effusion: Secondary | ICD-10-CM | POA: Diagnosis present

## 2021-12-04 DIAGNOSIS — E872 Acidosis, unspecified: Secondary | ICD-10-CM | POA: Diagnosis present

## 2021-12-04 DIAGNOSIS — C799 Secondary malignant neoplasm of unspecified site: Secondary | ICD-10-CM | POA: Diagnosis present

## 2021-12-04 DIAGNOSIS — G8929 Other chronic pain: Secondary | ICD-10-CM | POA: Diagnosis present

## 2021-12-04 DIAGNOSIS — M549 Dorsalgia, unspecified: Secondary | ICD-10-CM | POA: Diagnosis present

## 2021-12-04 DIAGNOSIS — G319 Degenerative disease of nervous system, unspecified: Secondary | ICD-10-CM | POA: Diagnosis present

## 2021-12-04 DIAGNOSIS — Z87891 Personal history of nicotine dependence: Secondary | ICD-10-CM | POA: Diagnosis not present

## 2021-12-04 DIAGNOSIS — E860 Dehydration: Secondary | ICD-10-CM | POA: Diagnosis present

## 2021-12-04 DIAGNOSIS — Z681 Body mass index (BMI) 19 or less, adult: Secondary | ICD-10-CM | POA: Diagnosis not present

## 2021-12-04 DIAGNOSIS — Z885 Allergy status to narcotic agent status: Secondary | ICD-10-CM | POA: Diagnosis not present

## 2021-12-04 DIAGNOSIS — Z79899 Other long term (current) drug therapy: Secondary | ICD-10-CM | POA: Diagnosis not present

## 2021-12-04 DIAGNOSIS — R42 Dizziness and giddiness: Secondary | ICD-10-CM

## 2021-12-04 DIAGNOSIS — F410 Panic disorder [episodic paroxysmal anxiety] without agoraphobia: Secondary | ICD-10-CM | POA: Diagnosis present

## 2021-12-04 DIAGNOSIS — N179 Acute kidney failure, unspecified: Secondary | ICD-10-CM

## 2021-12-04 DIAGNOSIS — R64 Cachexia: Secondary | ICD-10-CM | POA: Diagnosis present

## 2021-12-04 DIAGNOSIS — C3492 Malignant neoplasm of unspecified part of left bronchus or lung: Secondary | ICD-10-CM | POA: Diagnosis present

## 2021-12-04 LAB — CBC
HCT: 40 % (ref 39.0–52.0)
Hemoglobin: 12.2 g/dL — ABNORMAL LOW (ref 13.0–17.0)
MCH: 26.5 pg (ref 26.0–34.0)
MCHC: 30.5 g/dL (ref 30.0–36.0)
MCV: 86.8 fL (ref 80.0–100.0)
Platelets: 377 10*3/uL (ref 150–400)
RBC: 4.61 MIL/uL (ref 4.22–5.81)
RDW: 13.1 % (ref 11.5–15.5)
WBC: 14 10*3/uL — ABNORMAL HIGH (ref 4.0–10.5)
nRBC: 0 % (ref 0.0–0.2)

## 2021-12-04 LAB — URINALYSIS, ROUTINE W REFLEX MICROSCOPIC
Bilirubin Urine: NEGATIVE
Glucose, UA: NEGATIVE mg/dL
Hgb urine dipstick: NEGATIVE
Ketones, ur: NEGATIVE mg/dL
Leukocytes,Ua: NEGATIVE
Nitrite: NEGATIVE
Protein, ur: 30 mg/dL — AB
Specific Gravity, Urine: 1.01 (ref 1.005–1.030)
pH: 6 (ref 5.0–8.0)

## 2021-12-04 LAB — BASIC METABOLIC PANEL
Anion gap: 9 (ref 5–15)
BUN: 37 mg/dL — ABNORMAL HIGH (ref 8–23)
CO2: 23 mmol/L (ref 22–32)
Calcium: 14.1 mg/dL (ref 8.9–10.3)
Chloride: 104 mmol/L (ref 98–111)
Creatinine, Ser: 2.04 mg/dL — ABNORMAL HIGH (ref 0.61–1.24)
GFR, Estimated: 36 mL/min — ABNORMAL LOW (ref >60)
Glucose, Bld: 122 mg/dL — ABNORMAL HIGH (ref 70–99)
Potassium: 3.3 mmol/L — ABNORMAL LOW (ref 3.5–5.1)
Sodium: 136 mmol/L (ref 135–145)

## 2021-12-04 LAB — CORTISOL-AM, BLOOD: Cortisol - AM: 27.9 ug/dL — ABNORMAL HIGH (ref 6.7–22.6)

## 2021-12-04 LAB — PROTIME-INR
INR: 1.2 (ref 0.8–1.2)
Prothrombin Time: 15.2 seconds (ref 11.4–15.2)

## 2021-12-04 LAB — PROCALCITONIN: Procalcitonin: 0.1 ng/mL

## 2021-12-04 LAB — TROPONIN I (HIGH SENSITIVITY): Troponin I (High Sensitivity): 68 ng/L — ABNORMAL HIGH (ref <18)

## 2021-12-04 LAB — LACTIC ACID, PLASMA: Lactic Acid, Venous: 2.7 mmol/L (ref 0.5–1.9)

## 2021-12-04 MED ORDER — ADULT MULTIVITAMIN W/MINERALS CH
1.0000 | ORAL_TABLET | Freq: Every day | ORAL | Status: DC
  Administered 2021-12-04 – 2021-12-07 (×4): 1 via ORAL
  Filled 2021-12-04 (×4): qty 1

## 2021-12-04 MED ORDER — SODIUM CHLORIDE 0.9 % IV SOLN
2.0000 g | INTRAVENOUS | Status: DC
  Administered 2021-12-04: 2 g via INTRAVENOUS
  Filled 2021-12-04: qty 20

## 2021-12-04 MED ORDER — ALUM & MAG HYDROXIDE-SIMETH 200-200-20 MG/5ML PO SUSP
15.0000 mL | Freq: Four times a day (QID) | ORAL | Status: DC | PRN
  Administered 2021-12-04 – 2021-12-06 (×5): 15 mL via ORAL
  Filled 2021-12-04 (×5): qty 30

## 2021-12-04 MED ORDER — MIDODRINE HCL 5 MG PO TABS
5.0000 mg | ORAL_TABLET | Freq: Three times a day (TID) | ORAL | Status: DC
  Administered 2021-12-04 – 2021-12-07 (×9): 5 mg via ORAL
  Filled 2021-12-04 (×9): qty 1

## 2021-12-04 MED ORDER — ONDANSETRON HCL 4 MG PO TABS: 4.0000 mg | ORAL_TABLET | Freq: Four times a day (QID) | ORAL | Status: DC | PRN

## 2021-12-04 MED ORDER — LACTATED RINGERS IV SOLN: INTRAVENOUS | Status: DC

## 2021-12-04 MED ORDER — ACETAMINOPHEN 650 MG RE SUPP: 650.0000 mg | Freq: Four times a day (QID) | RECTAL | Status: DC | PRN

## 2021-12-04 MED ORDER — SODIUM CHLORIDE 0.9 % IV SOLN
3.0000 g | Freq: Four times a day (QID) | INTRAVENOUS | Status: DC
  Administered 2021-12-04 – 2021-12-06 (×7): 3 g via INTRAVENOUS
  Filled 2021-12-04 (×4): qty 3
  Filled 2021-12-04 (×2): qty 8
  Filled 2021-12-04 (×2): qty 3

## 2021-12-04 MED ORDER — OXYCODONE HCL 5 MG PO TABS
5.0000 mg | ORAL_TABLET | ORAL | Status: DC | PRN
  Administered 2021-12-04 – 2021-12-07 (×9): 5 mg via ORAL
  Filled 2021-12-04 (×9): qty 1

## 2021-12-04 MED ORDER — AMOXICILLIN-POT CLAVULANATE 875-125 MG PO TABS: 1.0000 | ORAL_TABLET | Freq: Two times a day (BID) | ORAL | Status: DC

## 2021-12-04 MED ORDER — ENSURE ENLIVE PO LIQD
237.0000 mL | Freq: Three times a day (TID) | ORAL | Status: DC
  Administered 2021-12-04 – 2021-12-06 (×4): 237 mL via ORAL

## 2021-12-04 MED ORDER — IOHEXOL 350 MG/ML SOLN
75.0000 mL | Freq: Once | INTRAVENOUS | Status: AC | PRN
  Administered 2021-12-04: 75 mL via INTRAVENOUS

## 2021-12-04 MED ORDER — ONDANSETRON HCL 4 MG/2ML IJ SOLN
4.0000 mg | Freq: Four times a day (QID) | INTRAMUSCULAR | Status: DC | PRN
  Administered 2021-12-04 – 2021-12-06 (×3): 4 mg via INTRAVENOUS
  Filled 2021-12-04 (×3): qty 2

## 2021-12-04 MED ORDER — ACETAMINOPHEN 325 MG PO TABS
650.0000 mg | ORAL_TABLET | Freq: Four times a day (QID) | ORAL | Status: DC | PRN
  Administered 2021-12-04 – 2021-12-07 (×6): 650 mg via ORAL
  Filled 2021-12-04 (×6): qty 2

## 2021-12-04 MED ORDER — SODIUM CHLORIDE 0.9 % IV SOLN
500.0000 mg | INTRAVENOUS | Status: DC
  Administered 2021-12-04: 500 mg via INTRAVENOUS
  Filled 2021-12-04: qty 5

## 2021-12-04 MED ORDER — POTASSIUM CHLORIDE CRYS ER 20 MEQ PO TBCR
40.0000 meq | EXTENDED_RELEASE_TABLET | Freq: Once | ORAL | Status: AC
  Administered 2021-12-04: 40 meq via ORAL
  Filled 2021-12-04: qty 2

## 2021-12-04 NOTE — Assessment & Plan Note (Addendum)
Likely due to Stage IV lung cancer  Received zoledronic acid and started on calcitonin in the ED Plan: --cont calcitonin BID for 48 hours Continue aggressive IV fluids

## 2021-12-04 NOTE — ED Notes (Signed)
This RN reported critical lab value - Calcium 14.1 to attending MD at bedside. MD ordered to continue IV fluids.

## 2021-12-04 NOTE — Progress Notes (Signed)
PROGRESS NOTE    Lucas Burke  WGY:[id number] DOB: Nov 25, 1960 DOA: 12/03/2021 PCP: [facility]  116A/116A-AA  LOS: 0 days   Brief hospital course:   Assessment & Plan: Lucas Burke is a 61 y.o. 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  Most of the history is given by the daughter over the phone who states that over the past 2 weeks her father has been very weak and falling a lot.  He has ongoing nausea and he has not been eating and probably getting in just 1 meal a day.  She also noted that he has been very confused.  She states he has a cough but it is no worse than the smoker's cough he has had for several years.  He is also constipated.  He has no fever or chills and has not vomited or complained of abdominal pain.  Daughter is concerned that her father might be very depressed.  He has been missing his oncology appointments.   * Hypercalcemia of malignancy Likely due to Stage IV lung cancer  Received zoledronic acid and calcitonin in the ED Plan: --cont calcitonin BID for 48 hours Continue aggressive IV fluids  Lactic acidosis --not strong evidence of PNA --cont MIVF and trend lactic acid.  Postural dizziness with presyncope Frequent falls Suspect multifactorial secondary to dehydration, malnutrition, hypercalcemia Treat each condition  AKI (acute kidney injury) ([place]) --Cr 2.37 on presentation.  Baseline around 1.4. --likely due to dehydration from hypercalcemia --cont MIVF  Acute respiratory failure with hypoxia (HCC) --O2 sat 88 on RA in triage, pt placed on 2L O2.  Secondary to malignancy, malignant effusion. --was started on abx for PNA, however, no obvious consolidation on CXR, no fever, procal not elevated, WBC elevated similar to the past year. Plan: --Continue supplemental O2 to keep  sats >=90%, wean as tolerated --transition abx to IV Unasyn for empiric/ppx treatment of post-obstructive PNA.    Protein calorie malnutrition, suspect severe ([place]) Secondary to malignancy, nausea and poor oral intake Nutritionist evaluation  Depression --pt was not taking paxil PTA  Acute metabolic encephalopathy Patient confused, disoriented, likely due to hypercalcemia CT head nonacute, showing generalized atrophy  Malignant pleural effusion --CTA chest showed loculated pleural fluid.   --thoracentesis attempted, but not much free fluid to draw.   DVT prophylaxis: SCD/Compression stockings Code Status: Full code  Family Communication: daughter updated on the phone today Level of care: Telemetry Medical Dispo:   The patient is from: home Anticipated d/c is to: home Anticipated d/c date is: 2-3 days   Subjective and Interval History:  No dyspnea at rest.     Objective: Vitals:   12/04/21 0748 12/04/21 1043 12/04/21 1152 12/04/21 1647  BP:  103/73 112/67 (!) 88/57  Pulse:  75 72 76  Resp:  16 18 17   Temp: 98.7 F (37.1 C) 97.9 F (36.6 C) 98.6 F (37 C) 98.2 F (36.8 C)  TempSrc: Oral Oral  Oral  SpO2:  100% 95% 98%  Weight:       No intake or output data in the 24 hours ending 12/04/21 1715 Filed Weights   12/03/21 2202  Weight: 65.8 kg    Examination:   Constitutional: NAD, alert, oriented HEENT: conjunctivae and lids normal, EOMI CV: No cyanosis.   RESP: normal respiratory effort, reduced lung sounds over posterior  left, on RA Extremities: No effusions, edema in BLE SKIN: warm, dry Neuro: II - XII grossly intact.   Psych: Normal mood and affect.     Data Reviewed: I have personally reviewed labs and imaging studies    Enzo Bi, MD [facility] If 7PM-7AM, please contact night-coverage 12/04/2021, 5:15 PM

## 2021-12-04 NOTE — Assessment & Plan Note (Addendum)
--  pt was not taking paxil PTA

## 2021-12-04 NOTE — Assessment & Plan Note (Addendum)
Frequent falls Suspect multifactorial secondary to dehydration, malnutrition, hypercalcemia Treat each condition

## 2021-12-04 NOTE — Progress Notes (Signed)
IR asked to evaluate patient for possible left thoracentesis. Limited ultrasound imaging did  not reveal much free fluid. US imaging shows mostly consolidation/loculation.   No procedure performed. Dr. Earleen Newport and Dr. Billie Ruddy made aware.  Soyla Dryer, [place] [phone] 12/04/2021, 10:10 AM

## 2021-12-04 NOTE — Consult Note (Signed)
Pharmacy Antibiotic Note  Lucas Burke is a 61 y.o. male admitted on 12/03/2021 with post-obstructive PNA.  Pharmacy has been consulted for unasyn dosing.  Plan: Give Unasyn 3g IV every 6 hours  Weight: 65.8 kg (145 lb)  Temp (24hrs), Avg:98.3 F (36.8 C), Min:97.9 F (36.6 C), Max:98.7 F (37.1 C)  Recent Labs  Lab 12/03/21 2211 12/03/21 2306 12/04/21 0059 12/04/21 0404  WBC 15.9*  --   --  14.0*  CREATININE 2.37*  --   --  2.04*  LATICACIDVEN  --  2.8* 2.7*  --     Estimated Creatinine Clearance: 34.9 mL/min (A) (by C-G formula based on SCr of 2.04 mg/dL (H)).    Allergies  Allergen Reactions   Codeine Nausea Only    Antimicrobials this admission: 9/30 Azithromycin + Ceftriaxone x1 ED 9/30 Unasyn >>    Microbiology results: 9/29 BCx: NGTD  Thank you for allowing pharmacy to be a part of this patient's care.  Darrick Penna 12/04/2021 5:18 PM

## 2021-12-04 NOTE — H&P (Signed)
History and Physical    Patient: Lucas Burke KGY:[id number] DOB: Oct 20, 1960 DOA: 12/03/2021 DOS: the patient was seen and examined on 12/04/2021 PCP: [facility]  Patient coming from: Home  Chief Complaint:  Chief Complaint  Patient presents with   Altered Mental Status   Dizziness    HPI: Lucas Burke is a 61 y.o. 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  Most of the history is given by the daughter over the phone who states that over the past 2 weeks her father has been very weak and falling a lot.  He has ongoing nausea and he has not been eating and probably getting in just 1 meal a day.  She also noted that he has been very confused.  She states he has a cough but it is no worse than the smoker's cough he has had for several years.  He is also constipated.  He has no fever or chills and has not vomited or complained of abdominal pain.  Daughter is concerned that her father might be very depressed.  He has been missing his oncology appointments. ED course and data review: BP on arrival 90/70 with O2 sat 89% on room air and otherwise normal vitals.  BP was fluid responsive to 121/78.  Labs significant for WBC of 16,000 with lactic acid 2.8.  Hemoglobin 12.8.  Calcium greater than 15.  Creatinine 2.37, up from 1.3, potassium 3.2.  Troponin 73.  Urinalysis unremarkable EKG, personally viewed and interpreted showing NSR at 79 with nonspecific ST-T wave changes.  CT head with no acute intracranial abnormality and generalized cerebral atrophy chest x-ray with right-sided pulmonary vascular congestion and unchanged complete opacification of the left hemithorax.  CTA PE protocol pending. Patient treated with a 2 L sodium chloride bolus, calcitonin IV, zoledronic acid and hospitalist consulted for admission. Lactic acid  ordered and pending.        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, left ([place]) 06/22/2021   Panic attacks    Past Surgical History:  Procedure Laterality Date   COLONOSCOPY  2010   DECORTICATION Left 06/01/2021   Procedure: DECORTICATION;  Surgeon: Dahlia Byes, MD;  Location: [facility];  Service: Thoracic;  Laterality: Left;   PLEURAL BIOPSY Left 06/01/2021   Procedure: PLEURAL BIOPSY;  Surgeon: Dahlia Byes, MD;  Location: [facility];  Service: Thoracic;  Laterality: Left;   VIDEO ASSISTED THORACOSCOPY (VATS)/EMPYEMA Left 06/01/2021   Procedure: VIDEO ASSISTED THORACOSCOPY (VATS)/EMPYEMA - APPLICATION OF ON-Q PAIN PUMP;  Surgeon: Dahlia Byes, MD;  Location: [facility];  Service: Thoracic;  Laterality: Left;   VIDEO BRONCHOSCOPY N/A 06/01/2021   Procedure: VIDEO BRONCHOSCOPY;  Surgeon: Dahlia Byes, MD;  Location: [facility];  Service: Thoracic;  Laterality: N/A;   Social History:  reports that he quit smoking about 9 months ago. His smoking use included cigarettes. He smoked an average of 2 packs per day. He has never used smokeless tobacco. He reports that he does not currently use alcohol. He reports that he does not use drugs.  Allergies  Allergen Reactions   Codeine Nausea Only    No family history  on file.  Prior to Admission medications   Medication Sig Start Date End Date Taking? Authorizing Provider  acetaminophen (TYLENOL) 325 MG tablet Take 650 mg by mouth every 6 (six) hours as needed.    [provider]  ALPRAZolam Duanne Moron) 0.25 MG tablet Take 1 tablet (0.25 mg total) by mouth 3 (three) times daily as needed for anxiety. 09/24/21   Volanda Napoleon, MD  alum & mag hydroxide-simeth (MAALOX/MYLANTA) 200-200-20 MG/5ML suspension Take 30 mLs by mouth every 4 (four) hours as needed for indigestion.  05/30/21   Ezekiel Slocumb, DO  cefdinir (OMNICEF) 300 MG capsule Take 2 capsules (600 mg total) by mouth daily. 10/15/21   Volanda Napoleon, MD  dronabinol (MARINOL) 5 MG capsule Take 1 capsule (5 mg total) by mouth 2 (two) times daily before a meal. 11/03/21   Ennever, Rudell Cobb, MD  feeding supplement (ENSURE ENLIVE / ENSURE PLUS) LIQD Take 237 mLs by mouth 3 (three) times daily between meals. 05/30/21   Nicole Kindred A, DO  gabapentin (NEURONTIN) 300 MG capsule TAKE 1 CAPSULE(300 MG) BY MOUTH AT BEDTIME 11/30/21   Volanda Napoleon, MD  magic mouthwash w/lidocaine SOLN Take 5 mLs by mouth 4 (four) times daily as needed for mouth pain. 11/02/21   Volanda Napoleon, MD  midodrine (PROAMATINE) 5 MG tablet Take 1 tablet (5 mg total) by mouth 3 (three) times daily with meals. 11/22/21   Volanda Napoleon, MD  Multiple Vitamin (MULTIVITAMIN WITH MINERALS) TABS tablet Take 1 tablet by mouth daily. 05/31/21   Ezekiel Slocumb, DO  naproxen (NAPROSYN) 500 MG tablet Take 1 tablet (500 mg total) by mouth 2 (two) times daily with a meal. 10/28/21   Ennever, Rudell Cobb, MD  ondansetron (ZOFRAN) 4 MG tablet Take 1 tablet (4 mg total) by mouth every 6 (six) hours as needed for nausea. 10/15/21   Volanda Napoleon, MD  oxyCODONE (OXY IR/ROXICODONE) 5 MG immediate release tablet Take 1 tablet (5 mg total) by mouth every 4 (four) hours as needed for severe pain. 11/22/21   Volanda Napoleon, MD  pantoprazole (PROTONIX) 40 MG tablet Take 1 tablet (40 mg total) by mouth daily. 07/16/21   Volanda Napoleon, MD  PARoxetine (PAXIL) 20 MG tablet Take 1 tablet by mouth daily. 09/09/20   [provider]  polyethylene glycol (MIRALAX) 17 g packet Take 17 g by mouth daily. 11/22/21   Volanda Napoleon, MD    Physical Exam: Vitals:   12/03/21 2313 12/04/21 0000 12/04/21 0030 12/04/21 0100  BP: 103/74 113/70 118/76 121/78  Pulse: 73 66 72 76  Resp: 18   16  Temp:      SpO2: 98% 99% 99% 96%  Weight:       Physical Exam Vitals  and nursing note reviewed.  Constitutional:      General: He is not in acute distress.    Appearance: He is cachectic.  HENT:     Head: Normocephalic and atraumatic.  Cardiovascular:     Rate and Rhythm: Normal rate and regular rhythm.     Heart sounds: Normal heart sounds.  Pulmonary:     Effort: Pulmonary effort is normal.     Breath sounds: Examination of the left-upper field reveals decreased breath sounds. Examination of the left-middle field reveals decreased breath sounds. Examination of the left-lower field reveals decreased breath sounds. Decreased breath sounds present.  Abdominal:     Palpations: Abdomen is soft.  Tenderness: There is no abdominal tenderness.  Neurological:     Mental Status: Mental status is at baseline. He is disoriented.     Labs on Admission: I have personally reviewed following labs and imaging studies  CBC: Recent Labs  Lab 12/03/21 2211  WBC 15.9*  NEUTROABS 12.5*  HGB 12.8*  HCT 41.5  MCV 85.9  PLT 272*   Basic Metabolic Panel: Recent Labs  Lab 12/03/21 2211  NA 136  K 3.2*  CL 97*  CO2 27  GLUCOSE 135*  BUN 41*  CREATININE 2.37*  CALCIUM >15.0*   GFR: Estimated Creatinine Clearance: 30.1 mL/min (A) (by C-G formula based on SCr of 2.37 mg/dL (H)). Liver Function Tests: Recent Labs  Lab 12/03/21 2211  AST 23  ALT 10  ALKPHOS 93  BILITOT 0.6  PROT 8.1  ALBUMIN 3.1*   No results for input(s): "LIPASE", "AMYLASE" in the last 168 hours. No results for input(s): "AMMONIA" in the last 168 hours. Coagulation Profile: No results for input(s): "INR", "PROTIME" in the last 168 hours. Cardiac Enzymes: No results for input(s): "CKTOTAL", "CKMB", "CKMBINDEX", "TROPONINI" in the last 168 hours. BNP (last 3 results) No results for input(s): "PROBNP" in the last 8760 hours. HbA1C: No results for input(s): "HGBA1C" in the last 72 hours. CBG: No results for input(s): "GLUCAP" in the last 168 hours. Lipid Profile: No results  for input(s): "CHOL", "HDL", "LDLCALC", "TRIG", "CHOLHDL", "LDLDIRECT" in the last 72 hours. Thyroid Function Tests: No results for input(s): "TSH", "T4TOTAL", "FREET4", "T3FREE", "THYROIDAB" in the last 72 hours. Anemia Panel: No results for input(s): "VITAMINB12", "FOLATE", "FERRITIN", "TIBC", "IRON", "RETICCTPCT" in the last 72 hours. Urine analysis:    Component Value Date/Time   COLORURINE YELLOW (A) 12/04/2021 0059   APPEARANCEUR HAZY (A) 12/04/2021 0059   LABSPEC 1.010 12/04/2021 0059   PHURINE 6.0 12/04/2021 0059   GLUCOSEU NEGATIVE 12/04/2021 0059   HGBUR NEGATIVE 12/04/2021 0059   BILIRUBINUR NEGATIVE 12/04/2021 0059   KETONESUR NEGATIVE 12/04/2021 0059   PROTEINUR 30 (A) 12/04/2021 0059   NITRITE NEGATIVE 12/04/2021 0059   LEUKOCYTESUR NEGATIVE 12/04/2021 0059    Radiological Exams on Admission: CT Head Wo Contrast  Result Date: 12/03/2021 CLINICAL DATA:  Lightheadedness. EXAM: CT HEAD WITHOUT CONTRAST TECHNIQUE: Contiguous axial images were obtained from the base of the skull through the vertex without intravenous contrast. RADIATION DOSE REDUCTION: This exam was performed according to the departmental dose-optimization program which includes automated exposure control, adjustment of the mA and/or kV according to patient size and/or use of iterative reconstruction technique. COMPARISON:  June 05, 2021 FINDINGS: Brain: There is mild cerebral atrophy with widening of the extra-axial spaces and ventricular dilatation. There are areas of decreased attenuation within the white matter tracts of the supratentorial brain, consistent with microvascular disease changes. Vascular: No hyperdense vessel or unexpected calcification. Skull: Normal. Negative for fracture or focal lesion. Sinuses/Orbits: No acute finding. Other: None. IMPRESSION: 1. No acute intracranial abnormality. 2. Generalized cerebral atrophy and microvascular disease changes of the supratentorial brain. Electronically  Signed   By: Virgina Norfolk M.D.   On: 12/03/2021 23:52   DG Chest Portable 1 View  Result Date: 12/03/2021 CLINICAL DATA:  Hypoxia EXAM: PORTABLE CHEST 1 VIEW COMPARISON:  Radiograph 06/07/2021 FINDINGS: Similar complete opacification of the left hemithorax with volume loss and deviation of the trachea to the left. Pulmonary vascular congestion in the right lung. No consolidation, pleural effusion, or pneumothorax on the right. The cardiac silhouette is obscured. No acute osseous abnormality.  IMPRESSION: 1. Right-sided pulmonary vascular congestion. 2. Unchanged complete opacification of the left hemithorax. Electronically Signed   By: Placido Sou M.D.   On: 12/03/2021 22:58     Data Reviewed: Relevant notes from primary care and specialist visits, past discharge summaries as available in EHR, including Care Everywhere. Prior diagnostic testing as pertinent to current admission diagnoses Updated medications and problem lists for reconciliation ED course, including vitals, labs, imaging, treatment and response to treatment Triage notes, nursing and pharmacy notes and ED provider's notes Notable results as noted in HPI   Assessment and Plan: Lactic acidosis, possible sepsis Suspect pneumonia with possible severe sepsis Patient with elevated WBC and lactic acidosis, AKI and acute metabolic encephalopathy Follow procalcitonin Empiric Rocephin and azithromycin Aggressive fluid resuscitation  Postural dizziness with presyncope Frequent falls Suspect multifactorial secondary to dehydration, malnutrition, hypercalcemia Treat each condition as outlined below Neurologic checks with fall precautions  AKI (acute kidney injury) ([place]) Likely ATN secondary to oral intake, possible sepsis Aggressive fluid resuscitation Monitor renal function and avoid nephrotoxins  Acute respiratory failure with hypoxia ([place]) Secondary to malignancy, malignant effusion and possible pneumonia Follow CTA  PE protocol ordered from the ED Supplemental oxygen and wean as tolerated Treat etiologies   Protein calorie malnutrition, suspect severe ([place]) Secondary to malignancy, nausea and poor oral intake Nutritionist evaluation  Depression Consider behavioral health consult.    Acute metabolic encephalopathy Patient confused, disoriented, likely related to dehydration, possible sepsis CT head nonacute, showing generalized atrophy Neurologic checks, fall precautions  Malignant pleural effusion IR consult for thoracentesis  Hypercalcemia of malignancy Stage IV lung cancer with left malignant effusion Received zoledronic acid and IV calcitonin in the ED Continue aggressive IV fluids Continue to monitor        DVT prophylaxis: SCDs  Consults: none  Advance Care Planning:   Code Status: Prior   Family Communication: daughter over the phone  Disposition Plan: Back to previous home environment  Severity of Illness: The appropriate patient status for this patient is INPATIENT. Inpatient status is judged to be reasonable and necessary in order to provide the required intensity of service to ensure the patient's safety. The patient's presenting symptoms, physical exam findings, and initial radiographic and laboratory data in the context of their chronic comorbidities is felt to place them at high risk for further clinical deterioration. Furthermore, it is not anticipated that the patient will be medically stable for discharge from the hospital within 2 midnights of admission.   * I certify that at the point of admission it is my clinical judgment that the patient will require inpatient hospital care spanning beyond 2 midnights from the point of admission due to high intensity of service, high risk for further deterioration and high frequency of surveillance required.*  Author: Athena Masse, MD 12/04/2021 1:33 AM  For on call review [email or web address].

## 2021-12-04 NOTE — Assessment & Plan Note (Signed)
Secondary to malignancy, nausea and poor oral intake Nutritionist evaluation

## 2021-12-04 NOTE — Assessment & Plan Note (Addendum)
--  Cr 2.37 on presentation.  Baseline around 1.4. --likely due to dehydration from hypercalcemia --cont MIVF

## 2021-12-04 NOTE — Assessment & Plan Note (Addendum)
--  CTA chest showed loculated pleural fluid.   --thoracentesis attempted, but not much free fluid to draw.

## 2021-12-04 NOTE — Assessment & Plan Note (Addendum)
--  O2 sat 88 on RA in triage, pt placed on 2L O2.  Secondary to malignancy, malignant effusion. --was started on ceftriaxone and azithromycin for PNA, however, no obvious consolidation on CXR, no fever, procal not elevated, WBC elevated similar to the past year.  transitioned abx to IV Unasyn for empiric/ppx treatment of post-obstructive PNA. --already back on room air --cont Unasyn to finish 5-day course

## 2021-12-04 NOTE — Assessment & Plan Note (Addendum)
--  not strong evidence of PNA --cont MIVF and trend lactic acid.

## 2021-12-04 NOTE — Progress Notes (Signed)
Initial Nutrition Assessment  DOCUMENTATION CODES:   Not applicable  INTERVENTION:   -Ensure Enlive po TID, each supplement provides 350 kcal and 20 grams of protein -Magic cup TID with meals, each supplement provides 290 kcal and 9 grams of protein  -MVI with minerals daily -Spiritual care consult  NUTRITION DIAGNOSIS:   Increased nutrient needs related to cancer and cancer related treatments as evidenced by estimated needs.  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 Suspect pneumonia.   Pt unavailable at time of visit. Attempted to speak with pt via call to hospital room phone, however, unable to reach. RD unable to obtain further nutrition-related history or complete nutrition-focused physical exam at this time.    Per IR notes, thoracentesis not performed due to limited free fluid.    Pt currently on a regular diet. No meal completion data available to assess at this time. Per H&P, pt with very poor oral intake secondary to nausea and typically consumes only one meal per day. Pt also followed by RD at [facility]); pt struggles with food insecurity and receives SNAP benefits. He has been offered a complimentary case of Ensure in the past, but did not pick up. Family also concerned that pt is depressed.   Reviewed wt hx; pt has experienced a 7.6% wt loss over the past 3 months, which is significant for time frame.   Pt is at high risk for malnutrition, however, unable to identify at this time. Pt would greatly benefit from addition of oral nutrition supplements.   Medications reviewed  and include lactated ringers infusion @ 150 ml/hr.   Labs reviewed: K: 3.3. CBGS: 87.    Diet Order:   Diet Order             Diet regular Room service appropriate? Yes; Fluid consistency: Thin  Diet effective now                   EDUCATION NEEDS:   No education needs have been identified at this time  Skin:  Skin Assessment: Reviewed RN Assessment  Last BM:  Unknown  Height:   Ht Readings from Last 1 Encounters:  10/28/21 6' 0.05" (1.83 m)    Weight:   Wt Readings from Last 1 Encounters:  12/03/21 65.8 kg    Ideal Body Weight:  80.9 kg  BMI:  Body mass index is 19.64 kg/m.  Estimated Nutritional Needs:   Kcal:  2300-2500  Protein:  130-145 grams  Fluid:  > 2 L    Loistine Chance, RD, LDN, CDCES Registered Dietitian II Certified Diabetes Care and Education Specialist Please refer to [facility] for RD and/or RD on-call/weekend/after hours pager

## 2021-12-04 NOTE — Assessment & Plan Note (Addendum)
Patient confused, disoriented, likely due to hypercalcemia CT head nonacute, showing generalized atrophy

## 2021-12-04 NOTE — Plan of Care (Signed)
  Problem: Health Behavior/Discharge Planning: Goal: Ability to manage health-related needs will improve Outcome: Progressing   

## 2021-12-05 LAB — BASIC METABOLIC PANEL
Anion gap: 5 (ref 5–15)
BUN: 25 mg/dL — ABNORMAL HIGH (ref 8–23)
CO2: 26 mmol/L (ref 22–32)
Calcium: 11.8 mg/dL — ABNORMAL HIGH (ref 8.9–10.3)
Chloride: 109 mmol/L (ref 98–111)
Creatinine, Ser: 1.77 mg/dL — ABNORMAL HIGH (ref 0.61–1.24)
GFR, Estimated: 43 mL/min — ABNORMAL LOW (ref >60)
Glucose, Bld: 99 mg/dL (ref 70–99)
Potassium: 3.4 mmol/L — ABNORMAL LOW (ref 3.5–5.1)
Sodium: 140 mmol/L (ref 135–145)

## 2021-12-05 LAB — MAGNESIUM: Magnesium: 1.8 mg/dL (ref 1.7–2.4)

## 2021-12-05 LAB — CBC
HCT: 33.7 % — ABNORMAL LOW (ref 39.0–52.0)
Hemoglobin: 10.3 g/dL — ABNORMAL LOW (ref 13.0–17.0)
MCH: 26.7 pg (ref 26.0–34.0)
MCHC: 30.6 g/dL (ref 30.0–36.0)
MCV: 87.3 fL (ref 80.0–100.0)
Platelets: 375 10*3/uL (ref 150–400)
RBC: 3.86 MIL/uL — ABNORMAL LOW (ref 4.22–5.81)
RDW: 13.3 % (ref 11.5–15.5)
WBC: 14.2 10*3/uL — ABNORMAL HIGH (ref 4.0–10.5)
nRBC: 0 % (ref 0.0–0.2)

## 2021-12-05 MED ORDER — POTASSIUM CHLORIDE CRYS ER 20 MEQ PO TBCR
40.0000 meq | EXTENDED_RELEASE_TABLET | Freq: Once | ORAL | Status: AC
  Administered 2021-12-05: 40 meq via ORAL
  Filled 2021-12-05: qty 2

## 2021-12-05 NOTE — Progress Notes (Signed)
Mobility Specialist - Progress Note   12/05/21 0851  Mobility  Activity Ambulated with assistance to bathroom;Ambulated with assistance in room;Dangled on edge of bed;Stood at bedside  Level of Assistance Standby assist, set-up cues, supervision of patient - no hands on  Assistive Device None  Distance Ambulated (ft) 10 ft  Activity Response Tolerated well  $Mobility charge 1 Mobility   Author responds to bed alarm with pt attempting to get OOB on RA. Pt STS and ambulates to and from restroom SBA. Pt returns to bed with needs in reach and bed alarm set.   Gretchen Short  Mobility Specialist  12/05/21 8:53 AM

## 2021-12-05 NOTE — Progress Notes (Signed)
Chaplain responded to consult to support pt. because of possible depression. Pt was resting and Chaplain offered compassionate presence. Please contact Chaplain if requested.

## 2021-12-05 NOTE — Progress Notes (Signed)
PROGRESS NOTE    Lucas Burke  QJF:[id number] DOB: 26-Jun-1960 DOA: 12/03/2021 PCP: [facility] [place]  116A/116A-AA  LOS: 1 day   Brief hospital course:   Assessment & Plan: Lucas Burke is a 61 y.o. 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  Most of the history is given by the daughter over the phone who states that over the past 2 weeks her father has been very weak and falling a lot.  He has ongoing nausea and he has not been eating and probably getting in just 1 meal a day.  She also noted that he has been very confused.  She states he has a cough but it is no worse than the smoker's cough he has had for several years.  Daughter is concerned that her father might be very depressed.  He has been missing his oncology appointments.   * Hypercalcemia of malignancy Likely due to Stage IV lung cancer  Received zoledronic acid and started on calcitonin in the ED Plan: --cont calcitonin BID for 48 hours Continue aggressive IV fluids  Lactic acidosis --not strong evidence of PNA --cont MIVF and trend lactic acid.  Postural dizziness with presyncope Frequent falls Suspect multifactorial secondary to dehydration, malnutrition, hypercalcemia Treat each condition  AKI (acute kidney injury) ([place]) --Cr 2.37 on presentation.  Baseline around 1.4. --likely due to dehydration from hypercalcemia --cont MIVF  Acute respiratory failure with hypoxia (HCC) --O2 sat 88 on RA in triage, pt placed on 2L O2.  Secondary to malignancy, malignant effusion. --was started on ceftriaxone and azithromycin for PNA, however, no obvious consolidation on CXR, no fever, procal not elevated, WBC elevated similar to the past year.  transitioned abx to IV Unasyn for empiric/ppx treatment of post-obstructive PNA. --already back on room  air --cont Unasyn to finish 5-day course    Protein calorie malnutrition, suspect severe (HCC) Secondary to malignancy, nausea and poor oral intake Nutritionist evaluation  Depression --pt was not taking paxil PTA  Acute metabolic encephalopathy Patient confused, disoriented, likely due to hypercalcemia CT head nonacute, showing generalized atrophy  Malignant pleural effusion --CTA chest showed loculated pleural fluid.   --thoracentesis attempted, but not much free fluid to draw.  Lung cancer, primary, with metastasis from lung to other site, left [facility]) --outpatient f/u with primary oncologist at [facility]   DVT prophylaxis: SCD/Compression stockings Code Status: Full code  Family Communication: daughter updated at the bedside today Level of care: Telemetry Medical Dispo:   The patient is from: home Anticipated d/c is to: home Anticipated d/c date is: 2-3 days   Subjective and Interval History:  No new complaint.     Objective: Vitals:   12/05/21 0608 12/05/21 0909 12/05/21 1136 12/05/21 1656  BP: 99/69 101/66 104/65 117/78  Pulse: 72 72 70 73  Resp: 17 17 17 18   Temp: 97.7 F (36.5 C) 97.6 F (36.4 C) 98.6 F (37 C) 98.4 F (36.9 C)  TempSrc:      SpO2: 97% 98% 96% 100%  Weight:        Intake/Output Summary (Last 24 hours) at 12/05/2021 1802 Last data filed at 12/05/2021 1204 Gross per 24 hour  Intake 4275.67 ml  Output --  Net 4275.67 ml   Filed Weights   12/03/21 2202  Weight: 65.8 kg    Examination:   Constitutional:  NAD, alert, oriented HEENT: conjunctivae and lids normal, EOMI CV: No cyanosis.   RESP: normal respiratory effort, on RA Neuro: II - XII grossly intact.     Data Reviewed: I have personally reviewed labs and imaging studies   Enzo Bi, MD [facility] If 7PM-7AM, please contact night-coverage 12/05/2021, 6:02 PM

## 2021-12-05 NOTE — Assessment & Plan Note (Signed)
--  outpatient f/u with primary oncologist at [facility]

## 2021-12-05 NOTE — Plan of Care (Signed)
  Problem: Clinical Measurements: Goal: Diagnostic test results will improve Outcome: Progressing   

## 2021-12-06 ENCOUNTER — Inpatient Hospital Stay: Payer: Medicaid Other

## 2021-12-06 DIAGNOSIS — E43 Unspecified severe protein-calorie malnutrition: Secondary | ICD-10-CM | POA: Insufficient documentation

## 2021-12-06 LAB — BASIC METABOLIC PANEL
Anion gap: 3 — ABNORMAL LOW (ref 5–15)
BUN: 20 mg/dL (ref 8–23)
CO2: 26 mmol/L (ref 22–32)
Calcium: 10.8 mg/dL — ABNORMAL HIGH (ref 8.9–10.3)
Chloride: 111 mmol/L (ref 98–111)
Creatinine, Ser: 1.64 mg/dL — ABNORMAL HIGH (ref 0.61–1.24)
GFR, Estimated: 47 mL/min — ABNORMAL LOW (ref >60)
Glucose, Bld: 91 mg/dL (ref 70–99)
Potassium: 3.1 mmol/L — ABNORMAL LOW (ref 3.5–5.1)
Sodium: 140 mmol/L (ref 135–145)

## 2021-12-06 LAB — CBC
HCT: 30.9 % — ABNORMAL LOW (ref 39.0–52.0)
Hemoglobin: 9.5 g/dL — ABNORMAL LOW (ref 13.0–17.0)
MCH: 26.8 pg (ref 26.0–34.0)
MCHC: 30.7 g/dL (ref 30.0–36.0)
MCV: 87 fL (ref 80.0–100.0)
Platelets: 325 10*3/uL (ref 150–400)
RBC: 3.55 MIL/uL — ABNORMAL LOW (ref 4.22–5.81)
RDW: 13.3 % (ref 11.5–15.5)
WBC: 12.9 10*3/uL — ABNORMAL HIGH (ref 4.0–10.5)
nRBC: 0 % (ref 0.0–0.2)

## 2021-12-06 LAB — LACTIC ACID, PLASMA: Lactic Acid, Venous: 1.2 mmol/L (ref 0.5–1.9)

## 2021-12-06 LAB — MAGNESIUM: Magnesium: 1.8 mg/dL (ref 1.7–2.4)

## 2021-12-06 MED ORDER — ADULT MULTIVITAMIN W/MINERALS CH: 1.0000 | ORAL_TABLET | Freq: Every day | ORAL | Status: DC

## 2021-12-06 MED ORDER — POTASSIUM CHLORIDE CRYS ER 20 MEQ PO TBCR
40.0000 meq | EXTENDED_RELEASE_TABLET | Freq: Once | ORAL | Status: AC
  Administered 2021-12-06: 40 meq via ORAL
  Filled 2021-12-06: qty 2

## 2021-12-06 MED ORDER — MIRTAZAPINE 15 MG PO TBDP
15.0000 mg | ORAL_TABLET | Freq: Every day | ORAL | Status: DC
  Administered 2021-12-06: 15 mg via ORAL
  Filled 2021-12-06 (×2): qty 1

## 2021-12-06 MED ORDER — GABAPENTIN 300 MG PO CAPS
300.0000 mg | ORAL_CAPSULE | Freq: Every day | ORAL | Status: DC
  Administered 2021-12-06: 300 mg via ORAL
  Filled 2021-12-06: qty 1

## 2021-12-06 MED ORDER — AMOXICILLIN-POT CLAVULANATE 875-125 MG PO TABS
1.0000 | ORAL_TABLET | Freq: Two times a day (BID) | ORAL | Status: DC
  Administered 2021-12-06 – 2021-12-07 (×3): 1 via ORAL
  Filled 2021-12-06 (×3): qty 1

## 2021-12-06 MED ORDER — ALPRAZOLAM 0.25 MG PO TABS
0.2500 mg | ORAL_TABLET | Freq: Three times a day (TID) | ORAL | Status: DC | PRN
  Administered 2021-12-06: 0.25 mg via ORAL
  Filled 2021-12-06: qty 1

## 2021-12-06 MED ORDER — ENSURE ENLIVE PO LIQD
237.0000 mL | Freq: Every day | ORAL | Status: DC
  Administered 2021-12-06 – 2021-12-07 (×3): 237 mL via ORAL

## 2021-12-06 NOTE — Evaluation (Signed)
Physical Therapy Evaluation Patient Details Name: Lucas Burke MRN: [id number] DOB: 21-Dec-1960 Today's Date: 12/06/2021  History of Present Illness  presented to ER secondary to AMS, dizziness and syncopal episode; admitted for management of sepsis due to PNA, hypercalcemia.  Clinical Impression  Patient resting in bed upon arrival to room; alert and oriented, follows commands.  Appears generally anxious about mobility and functional status.  Endorses generalized soreness throughout body, but no focal, specific pain.  Generally weak and deconditioned due to both acute/chronic health conditions; no focal weakness appreciated.  Bilat UE/LE strength and ROM grossly symmetrical and WFL for basic transfers and gait.  Able to complete bed mobility with mod indep; sit/stand, basic transfers and gait (200') with RW, cga/close sup.  Demonstrates narrowed BOS, mild forward trunk flexion; slow and deliberate stepping pattern, limited balance reactions.  Do recommend continued use of RW; patient in agreement.  Sats 93-96% on RA with gait efforts. Of note, appox 20-point drop in SBP with transition to upright; mild symptoms reported, but tends to accommodate with sustained upright positioning.  RN/MD informed/aware. Would benefit from skilled PT to address above deficits and promote optimal return to PLOF.; Recommend transition to HHPT upon discharge from acute hospitalization.      Recommendations for follow up therapy are one component of a multi-disciplinary discharge planning process, led by the attending physician.  Recommendations may be updated based on patient status, additional functional criteria and insurance authorization.  Follow Up Recommendations Home health PT      Assistance Recommended at Discharge Intermittent Supervision/Assistance  Patient can return home with the following  A little help with walking and/or transfers;A little help with bathing/dressing/bathroom    Equipment  Recommendations Rolling walker (2 wheels)  Recommendations for Other Services       Functional Status Assessment Patient has had a recent decline in their functional status and demonstrates the ability to make significant improvements in function in a reasonable and predictable amount of time.     Precautions / Restrictions Precautions Precautions: Fall Restrictions Weight Bearing Restrictions: No      Mobility  Bed Mobility Overal bed mobility: Modified Independent                  Transfers Overall transfer level: Needs assistance Equipment used: Rolling walker (2 wheels) Transfers: Sit to/from Stand Sit to Stand: Min guard, Supervision           General transfer comment: cuing for hand placement to prevent pulling on RW    Ambulation/Gait Ambulation/Gait assistance: Min guard, Supervision Gait Distance (Feet): 200 Feet Assistive device: Rolling walker (2 wheels)         General Gait Details: narrowed BOS, mild forward trunk flexion; slow and deliberate stepping pattern, limited balance reactions.  Do recommend continued use of RW; patient in agreement.  Sats 93-96% on RA with gait efforts  Stairs            Wheelchair Mobility    Modified Rankin (Stroke Patients Only)       Balance Overall balance assessment: Needs assistance Sitting-balance support: No upper extremity supported, Feet supported Sitting balance-Leahy Scale: Good     Standing balance support: Bilateral upper extremity supported Standing balance-Leahy Scale: Fair                               Pertinent Vitals/Pain Pain Assessment Pain Assessment: No/denies pain    Home Living Family/patient expects to  be discharged to:: Private residence Living Arrangements: Children Available Help at Discharge: Family Type of Home: House Home Access: Stairs to enter Entrance Stairs-Rails: Can reach both Entrance Stairs-Number of Steps: 3-4   Home Layout: One  level [place] - single point      Prior Function Prior Level of Function : Independent/Modified Independent               ADLs Comments: Pt reports being independent in all aspects of care and has 3 children at home he cares for ages 14,8, and 5. He is at home each day with the 63 year old.     Hand Dominance        Extremity/Trunk Assessment   Upper Extremity Assessment Upper Extremity Assessment: Generalized weakness    Lower Extremity Assessment Lower Extremity Assessment: Generalized weakness (grossly 4-/5 throughout)       Communication   Communication: No difficulties  Cognition Arousal/Alertness: Awake/alert Behavior During Therapy: Anxious Overall Cognitive Status: Within Functional Limits for tasks assessed                                          General Comments      Exercises Other Exercises Other Exercises: Toilet transfer, ambulatory without assist device, cga/close sup; standing balance for urination, sup.  Limited functional reach, but good awareness/use of compensatory strategies as needed. Issued standing LE HEP for LE strength/balance to be performed outside of therapy (holding RW for support); given handout with written/pictorial descriptions.  Patient voiced understanding of all therex.   Assessment/Plan    PT Assessment Patient needs continued PT services  PT Problem List Decreased strength;Decreased activity tolerance;Decreased balance;Decreased mobility;Decreased knowledge of use of DME;Decreased safety awareness;Decreased knowledge of precautions;Cardiopulmonary status limiting activity       PT Treatment Interventions DME instruction;Gait training;Stair training;Functional mobility training;Therapeutic activities;Therapeutic exercise;Balance training;Patient/family education    PT Goals (Current goals can be found in the Care Plan section)  Acute Rehab PT Goals Patient Stated Goal: to get stronger PT Goal  Formulation: With patient Time For Goal Achievement: 12/20/21 Potential to Achieve Goals: Good    Frequency Min 2X/week     Co-evaluation               AM-PAC PT "6 Clicks" Mobility  Outcome Measure Help needed turning from your back to your side while in a flat bed without using bedrails?: None Help needed moving from lying on your back to sitting on the side of a flat bed without using bedrails?: None Help needed moving to and from a bed to a chair (including a wheelchair)?: A Little Help needed standing up from a chair using your arms (e.g., wheelchair or bedside chair)?: A Little Help needed to walk in hospital room?: A Little Help needed climbing 3-5 steps with a railing? : A Little 6 Click Score: 20    End of Session Equipment Utilized During Treatment: Gait belt Activity Tolerance: Patient tolerated treatment well Patient left: in bed;with call bell/phone within reach;with bed alarm set Nurse Communication: Mobility status PT Visit Diagnosis: Muscle weakness (generalized) (M62.81);Difficulty in walking, not elsewhere classified (R26.2)    Time: 7622-6333 PT Time Calculation (min) (ACUTE ONLY): 26 min   Charges:   PT Evaluation $PT Eval Moderate Complexity: 1 Mod PT Treatments $Therapeutic Activity: 8-22 mins       Lariya Kinzie H. Owens Shark, PT, DPT, NCS 12/06/21,  4:58 PM [phone]

## 2021-12-06 NOTE — TOC Initial Note (Signed)
Transition of Care [facility]) - Initial/Assessment Note    Patient Details  Name: Lucas Burke MRN: [id number] Date of Birth: [birth year]  Transition of Care [facility]) CM/SW Contact:    Candie Chroman, LCSW Phone Number: 12/06/2021, 1:32 PM  Clinical Narrative:   CSW met with patient. No supports at bedside. CSW introduced role and explained that discharge planning would be discussed. PCP is [facility]. Patient does not see a specific provider, just whoever is available. Patient typically gets rides from friends to appointments. Discussed issues contributing to not following up at the cancer center. Encouraged him to contact DSS and get set up with Medicaid Transportation. Information added to AVS. No home health prior to admission although patient is interested. Told him we may have difficulty finding someone to accept him due to Medicaid. No DME use prior to admission. MD entered DME order for RW and patient is agreeable. Ordered through [employer]. No further concerns. CSW encouraged patient to contact CSW as needed. CSW will continue to follow patient for support and facilitate return home once stable. Patient said his daughter [name] will likely transport him home at discharge.               Expected Discharge Plan: Home/Self Care Barriers to Discharge: Continued Medical Work up   Patient Goals and CMS Choice Patient states their goals for this hospitalization and ongoing recovery are:: "To get a head start before I get out there." CMS Medicare.gov Compare Post Acute Care list provided to::  (N/A) Choice offered to / list presented to : NA  Expected Discharge Plan and Services Expected Discharge Plan: Home/Self Care     Post Acute Care Choice: Durable Medical Equipment Living arrangements for the past 2 months: Mobile Home                 DME Arranged: Walker rolling DME Agency: [employer] Date DME Agency Contacted: 12/06/21   Representative spoke with at DME  Agency: Suanne Marker            Prior Living Arrangements/Services Living arrangements for the past 2 months: Mobile Home   Patient language and need for interpreter reviewed:: Yes Do you feel safe going back to the place where you live?: Yes      Need for Family Participation in Patient Care: Yes (Comment) Care giver support system in place?: Yes (comment)   Criminal Activity/Legal Involvement Pertinent to Current Situation/Hospitalization: No - Comment as needed  Activities of Daily Living      Permission Sought/Granted                  Emotional Assessment Appearance:: Appears stated age Attitude/Demeanor/Rapport: Engaged, Gracious Affect (typically observed): Accepting, Appropriate, Calm, Pleasant Orientation: : Oriented to Self, Oriented to Place, Oriented to  Time, Oriented to Situation Alcohol / Substance Use: Not Applicable Psych Involvement: No (comment)  Admission diagnosis:  Hypercalcemia [E83.52] Lightheadedness [R42] Acute respiratory failure with hypoxia (HCC) [J96.01] Sepsis ([place]) [A41.9] Patient Active Problem List   Diagnosis Date Noted   Acute respiratory failure with hypoxia ([place]) 12/04/2021   Postural dizziness with presyncope 12/04/2021   Malignant pleural effusion 12/04/2021   Lactic acidosis 12/04/2021   Frequent falls 12/04/2021   AKI (acute kidney injury) ([place]) 09/38/1829   Acute metabolic encephalopathy 93/71/6967   Depression 12/04/2021   Sepsis ([place]) 12/04/2021   Lung cancer, primary, with metastasis from lung to other site, left ([place]) 06/22/2021   Hypercalcemia of malignancy 06/22/2021   Empyema (  [place]) 05/30/2021   Empyema of left pleural space ([place]) 05/29/2021   Hypoparathyroidism ([place]) 05/28/2021   Pleural effusion, left 05/27/2021   Hypercalcemia 05/26/2021   Iron deficiency 05/26/2021   Protein calorie malnutrition, suspect severe ([employer]) 05/25/2021   Tremor 05/23/2021   Pneumonia 05/22/2021   Chronic back pain    Unintentional  weight loss    Inadequate oral intake    Physical debility    Hyperbilirubinemia    Thrombocytosis    Marijuana use 05/28/2019   Chronic pain syndrome 05/23/2019   Pharmacologic therapy 05/23/2019   Disorder of skeletal system 05/23/2019   Problems influencing health status 05/23/2019   Long term prescription benzodiazepine use (Alprazolam) 05/23/2019   Abnormal CT scan, cervical spine (01/30/2019) 05/23/2019   DDD (degenerative disc disease), cervical 05/23/2019   Cervical facet arthropathy (Multilevel) (Bilateral) 05/23/2019   Cervical foraminal stenosis 05/23/2019   Abnormal myelogram of cervical spine (2014) 05/23/2019   Cervical Grade 1 Anterolisthesis of C3/C4 05/23/2019   Abnormal CT myelogram of lumbar spine (2014) 05/23/2019   Chronic low back pain (2ry area of Pain) (Bilateral) (L>R) 05/23/2019   Chronic lower extremity pain (1ry area of Pain) (Bilateral) (L>R) 05/23/2019   Chronic lumbar radiculitis (Left) (L4) 05/23/2019   Chronic neck pain (3ry area of Pain) (Left) 05/23/2019   Cervicalgia 05/23/2019   Chronic thoracic back pain (Midline) 05/23/2019   Chronic hip pain (Left) 05/23/2019   Chronic groin pain (Left) 05/23/2019   PCP:  [facility] [place]:   [facility] [place] [id number] [address] [place] [address] [place] [address] Phone: [phone] Fax: [phone]  [name] [address] [address] Phone: [phone] Fax: [phone]  [id number] [address] [place]Phone: [phone]: [address] [place]Phone: [phone]: [phone]    Social Determinants of Health (SDOH) Interventions    Readmission Risk Interventions    05/23/2021    3:37 PM  Readmission  Risk Prevention Plan  Post Dischage Appt Complete  Medication Screening Complete  Transportation Screening Complete

## 2021-12-06 NOTE — Plan of Care (Signed)
  Problem: Clinical Measurements: Goal: Signs and symptoms of infection will decrease Outcome: Progressing

## 2021-12-06 NOTE — Progress Notes (Signed)
[place] [facility] [place] NAME: Lucas Burke    MR#:  [id number]  DATE OF BIRTH:  1960-05-19  SUBJECTIVE:  patient seen earlier. No family at bedside. Was eating chocolate magic cup. Ambulated with mobility therapist using rolling walker earlier. No fever. Complains of generalized weakness.   VITALS:  Blood pressure 102/66, pulse 70, temperature 98.2 F (36.8 C), temperature source Oral, resp. rate 16, weight 65.8 kg, SpO2 100 %.  PHYSICAL EXAMINATION:   GENERAL:  61 y.o.-year-old patient lying in the bed with no acute distress. Thin cachectic LUNGS: Normal breath sounds bilaterally, no wheezing, rales, rhonchi.  CARDIOVASCULAR: S1, S2 normal. No murmurs, rubs, or gallops.  ABDOMEN: Soft, nontender, nondistended. Bowel sounds present.  EXTREMITIES: No  edema b/l.    NEUROLOGIC: nonfocal  patient is alert and awake  LABORATORY PANEL:  CBC Recent Labs  Lab 12/06/21 0516  WBC 12.9*  HGB 9.5*  HCT 30.9*  PLT 325    Chemistries  Recent Labs  Lab 12/03/21 2211 12/04/21 0404 12/06/21 0516  NA 136   < > 140  K 3.2*   < > 3.1*  CL 97*   < > 111  CO2 27   < > 26  GLUCOSE 135*   < > 91  BUN 41*   < > 20  CREATININE 2.37*   < > 1.64*  CALCIUM >15.0*   < > 10.8*  MG  --    < > 1.8  AST 23  --   --   ALT 10  --   --   ALKPHOS 93  --   --   BILITOT 0.6  --   --    < > = values in this interval not displayed.    Assessment and Plan TYSHAN ENDERLE is a 61 y.o. 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   Most of the history is given by the daughter over the phone who states that over the past 2 weeks her father has been very weak and falling a lot.  He has ongoing nausea and he has not been eating and probably getting in just 1 meal a day.  She also noted that he has been very  confused.  She states he has a cough but it is no worse than the smoker's cough he has had for several years.  Daughter is concerned that her father might be very depressed.  He has been missing his oncology appointments.   Hypercalcemia of malignancy Likely due to Stage IV lung cancer  Received zoledronic acid and started on calcitonin in the ED --cont calcitonin BID for 48 hours Continue aggressive IV fluids Came in with calcium of 15.0--10.8 D/w oncologist Dr.Ennever-- patient has follow-up appointment on October 5. Labs will be checked at that appointment.   Lactic acidosis --not strong evidence of PNA --cont MIVF and trend lactic acid going down.   Postural dizziness with presyncope Frequent falls Suspect multifactorial secondary to dehydration, malnutrition, hypercalcemia Treat each condition patient recommended to keep his hydration.   AKI (acute kidney injury) ([place]) --Cr 2.37 on presentation.  Baseline around 1.4. --likely due to dehydration from hypercalcemia -- received IV fluids. Creatinine down to 1.6   Acute respiratory failure with hypoxia (HCC) --O2 sat 88 on RA in triage, pt placed on 2L O2.  Secondary to malignancy, malignant effusion. --was started on ceftriaxone and azithromycin for PNA, however, no obvious consolidation on CXR, no fever, procal not elevated, WBC elevated similar to the past year.  transitioned abx to IV Unasyn for empiric/ppx treatment of post-obstructive PNA. --already back on room air --cont Unasyn to finish 5-day course -- sats remain more than 92% on room air   Protein calorie malnutrition, severe (HCC) Secondary to malignancy, nausea and poor oral intake Nutritionist evaluation   Depression --pt was not taking paxil PTA -- will start him on low-dose Remeron.   Acute metabolic encephalopathy Patient confused, disoriented, likely due to hypercalcemia CT head nonacute, showing generalized atrophy   Malignant pleural effusion --CTA  chest showed loculated pleural fluid.   --thoracentesis attempted, but not much free fluid to draw.   Lung cancer, primary, with metastasis from lung to other site, left [facility]) --outpatient f/[facility] with primary onc[facility] at [facility]--Dr Ennevar informed of pt's admission     DVT prophylaxis: SCD/Compression stockings Code Status: Full code  Family Communication: daughter updated  by phone today Level of care: Telemetry Medical Dispo:   The patient is from: home Anticipated d/c is to: home with HHPT Anticipated d/c date is: 10/3        TOTAL TIME TAKING CARE OF THIS PATIENT: 35 minutes.  >50% time spent on counselling and coordination of care  Note: This dictation was prepared with Dragon dictation along with smaller phrase technology. Any transcriptional errors that result from this process are unintentional.  Fritzi Mandes M.D    [facility]   CC: Primary care physician; [facility] [place]

## 2021-12-06 NOTE — Progress Notes (Signed)
       CROSS COVER NOTE  NAME: Lucas Burke MRN: [id number] DOB : [birth year]    Date of Service   12/06/2021   HPI/Events of Note   Notified by nursing that IVF was held at patient request for reports of feeling as though he is drowning. RN reports patient lung sounds are clear and diminished. SPO2 100% on RA, RR18.  Interventions   Assessment/Plan:  CXR --> stable, no pulmonary vascular congestion seen Restart IVF at reduced rate    This document was prepared using Dragon voice recognition software and may include unintentional dictation errors.  Neomia Glass DNP, MHA, FNP-BC Nurse Practitioner [facility] Pager [phone]

## 2021-12-06 NOTE — Progress Notes (Signed)
Nutrition Follow-up  DOCUMENTATION CODES:   Severe malnutrition in context of chronic illness  INTERVENTION:   -Increase Ensure Enlive po 6 times daily, each supplement provides 350 kcal and 20 grams of protein -Continue MVI with minerals daily -Continue Magic cup TID with meals, each supplement provides 290 kcal and 9 grams of protein   NUTRITION DIAGNOSIS:   Severe Malnutrition related to chronic illness (stage IV lung cancer) as evidenced by severe fat depletion, severe muscle depletion.  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 He reports feeling a little better today, but admits to nausea. Pt drinking some milk and orange juice without difficulty. Pt reports he pretty much "lives" off of liquids at home- usually consuming 4-6 Ensure supplements per day and some soda. Pt's daughter helps with with purchasing Ensure supplements. RD also provided 2 two grape juices per his request. He states the Magic Cups are "delicious".   Allowed pt to reflect on his hospitalization. Pt shared stories about his 4 kids (8 and 60 year old daughters and 4 and 29 year old sons). Pt very appreciative of chaplain visits.   Medications reviewed and include lactated ringers infusion @ 150 ml/hr.   Labs reviewed. K: 3.1, CBGS: 87.  NUTRITION - FOCUSED PHYSICAL EXAM:  Flowsheet Row Most Recent Value  Orbital Region Severe depletion  Upper Arm Region Severe depletion  Thoracic and Lumbar Region Severe depletion  Buccal Region Severe  depletion  Temple Region Severe depletion  Clavicle Bone Region Severe depletion  Clavicle and Acromion Bone Region Severe depletion  Scapular Bone Region Severe depletion  Dorsal Hand Severe depletion  Patellar Region Severe depletion  Anterior Thigh Region Severe depletion  Posterior Calf Region Severe depletion  Edema (RD Assessment) None  Hair Reviewed  Eyes Reviewed  Mouth Reviewed  Skin Reviewed  Nails Reviewed       Diet Order:   Diet Order             Diet regular Room service appropriate? Yes; Fluid consistency: Thin  Diet effective now                   EDUCATION NEEDS:   Education needs have been addressed  Skin:  Skin Assessment: Reviewed RN Assessment  Last BM:  12/05/21 (type 4)  Height:   Ht Readings from Last 1 Encounters:  10/28/21 6' 0.05" (1.83 m)    Weight:   Wt Readings from Last 1 Encounters:  12/03/21 65.8 kg    Ideal Body Weight:  80.9 kg  BMI:  Body mass index is 19.64 kg/m.  Estimated Nutritional Needs:   Kcal:  2300-2500  Protein:  130-145 grams  Fluid:  > 2 L    Loistine Chance, RD, LDN, CDCES Registered Dietitian II Certified Diabetes Care and Education Specialist Please refer to [facility] for RD and/or RD on-call/weekend/after hours pager

## 2021-12-06 NOTE — Progress Notes (Signed)
   12/06/21 1000  Clinical Encounter Type  Visited With Patient  Visit Type Initial  Referral From Nurse  Consult/Referral To Chaplain   Chaplain followed up from on call Chaplain.Chaplain provided compassionate presence and reflective listening as patient spoke about health challenges. Patient is sad and teary about being in the hospital. Chaplain provided hospitality with soda and ice. Chaplain discussed ways to cope with being in the hospital. Chaplain services are available for follow up as needed.

## 2021-12-06 NOTE — Progress Notes (Signed)
Mobility Specialist - Progress Note  Pre-mobility: SpO2 98% During mobility: SpO2 89%-92% Post-mobility: SPO2 98%   12/06/21 1031  Mobility  Activity Ambulated independently in hallway  Activity Response Tolerated well  Distance Ambulated (ft) 160 ft  $Mobility charge 1 Mobility  Level of Assistance Modified independent, requires aide device or extra time  Assistive Device Front wheel walker  Mobility Referral Yes   Pt supine upon entry, utilizing RA. Pt completed bed mob indep, STS to RW indep. Pt ambulated one lap around NS, tolerated well. Approximately 80 ft into ambulation Pt O2 dropped to 89%, returning to 92% after 2 mins of standing rest break. Pt voiced having some SOB and dizziness ( 2 out of 10), RN notified. Pt left supine with needs in reach and alarm set.   Lucas Burke Mobility Specialist 12/06/21 10:37 AM

## 2021-12-07 LAB — BASIC METABOLIC PANEL
Anion gap: 3 — ABNORMAL LOW (ref 5–15)
BUN: 17 mg/dL (ref 8–23)
CO2: 26 mmol/L (ref 22–32)
Calcium: 11.1 mg/dL — ABNORMAL HIGH (ref 8.9–10.3)
Chloride: 112 mmol/L — ABNORMAL HIGH (ref 98–111)
Creatinine, Ser: 1.68 mg/dL — ABNORMAL HIGH (ref 0.61–1.24)
GFR, Estimated: 46 mL/min — ABNORMAL LOW (ref >60)
Glucose, Bld: 97 mg/dL (ref 70–99)
Potassium: 3.5 mmol/L (ref 3.5–5.1)
Sodium: 141 mmol/L (ref 135–145)

## 2021-12-07 LAB — MAGNESIUM: Magnesium: 1.7 mg/dL (ref 1.7–2.4)

## 2021-12-07 MED ORDER — MIRTAZAPINE 15 MG PO TBDP: 15.0000 mg | ORAL_TABLET | Freq: Every day | ORAL | 0 refills | Status: DC

## 2021-12-07 MED ORDER — POTASSIUM CHLORIDE CRYS ER 20 MEQ PO TBCR: 40.0000 meq | EXTENDED_RELEASE_TABLET | Freq: Once | ORAL | Status: DC

## 2021-12-07 MED ORDER — POTASSIUM CHLORIDE 20 MEQ PO PACK
40.0000 meq | PACK | Freq: Once | ORAL | Status: AC
  Administered 2021-12-07: 40 meq via ORAL
  Filled 2021-12-07: qty 2

## 2021-12-07 MED ORDER — MAGNESIUM SULFATE 2 GM/50ML IV SOLN
2.0000 g | Freq: Once | INTRAVENOUS | Status: AC
  Administered 2021-12-07: 2 g via INTRAVENOUS
  Filled 2021-12-07: qty 50

## 2021-12-07 MED ORDER — AMOXICILLIN-POT CLAVULANATE 875-125 MG PO TABS: 1.0000 | ORAL_TABLET | Freq: Two times a day (BID) | ORAL | 0 refills | Status: AC

## 2021-12-07 NOTE — Progress Notes (Signed)
Mobility Specialist - Progress Note    12/07/21 1453  Mobility  Activity Ambulated independently to bathroom  Activity Response Tolerated well  Distance Ambulated (ft) 8 ft  $Mobility charge 1 Mobility  Level of Assistance Independent  Assistive Device None  Mobility Referral Yes   Pt supine upon entry, utilizing RA. Pt ambulated to/ from restroom without AD, tolerated well. Pt left supine with alarm set and needs within reach. No complaints.   Candie Mile Mobility Specialist 12/07/21 2:54 PM

## 2021-12-07 NOTE — Discharge Summary (Signed)
Physician Discharge Summary   Patient: Lucas Burke MRN: [id number] DOB: 1960/11/06  Admit date:     12/03/2021  Discharge date: 12/07/21  Discharge Physician: Fritzi Mandes   PCP: [facility] [place]   Recommendations at discharge:   follow-up Dr. 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   Most of the history is given by the daughter over the phone who states that over the past 2 weeks her father has been very weak and falling a lot.  He has ongoing nausea and he has not been eating and probably getting in just 1 meal a day.  She also noted that he has been very confused.  She states he has a cough but it is no worse than the smoker's cough he has had for several years.  Daughter is concerned that her father might be very depressed.  He has been missing his oncology appointments.   Hypercalcemia of malignancy Likely due to Stage IV lung cancer  Received zoledronic acid and started on calcitonin in the ED --received calcitonin BID for 48 hours Continue aggressive IV fluids Came in with calcium of 15.0--10.8 D/w oncologist Dr.Ennever-- patient has follow-up appointment  on October 5. Labs will be checked at that appointment.   Lactic acidosis --not strong evidence of PNA --recieved MIVF and trend lactic acid going down.   Postural dizziness with presyncope Frequent falls Suspect multifactorial secondary to dehydration, malnutrition, hypercalcemia patient recommended to keep his hydration. Seen by physical therapy ambulated in the hallways with walker. Recommends home health PT. Patient's Medicaid per TOC will be difficult to get PT at home.   AKI (acute kidney injury) ([place]) --Cr 2.37 on presentation.  Baseline around 1.4. --likely due to dehydration from hypercalcemia -- received IV fluids. Creatinine down to 1.6   Acute respiratory failure with hypoxia (HCC) --O2 sat 88 on RA in triage, pt placed on 2L O2.  Secondary to malignancy, malignant effusion. --was started on ceftriaxone and azithromycin for PNA, however, no obvious consolidation on CXR, no fever, procal not elevated, WBC elevated similar to the past year.  transitioned abx to IV Unasyn for empiric/ppx treatment of post-obstructive PNA. --already back on room air --cont augmentin to finish 5-day course -- sats remain more than 92% on room air   Protein calorie malnutrition, severe (HCC) Secondary to malignancy, nausea and poor oral intake Nutritionist evaluation appreciated.   Depression --pt was not taking paxil PTA -- will start him on low-dose Remeron.   Acute metabolic encephalopathy Patient confused, disoriented, likely due to hypercalcemia CT head nonacute, showing generalized atrophy   abnormal chest x-ray with left sided completed pacification. Patient had similar x-ray in April 2023. Suspected  due to lung mass with lung collapse --CTA chest showed loculated pleural fluid.   --thoracentesis attempted, but not much free fluid to draw.   Lung cancer, primary, with metastasis from lung to other site, left [facility]) --outpatient f/u with primary oncologist at [facility]--Dr Ennevar  informed of pt's admission     DVT prophylaxis: SCD/Compression stockings Code Status: Full code  Family Communication: left message for daughter [place]. Discussed with sister Doylene Canard. Dispo:   The patient is from: home Anticipated d/c is to: home with HHPT Anticipated d/c date is: 10/3  Overall near baseline. Poor prognosis. Will discharged to home.     Pain control - [employer] Controlled Substance Reporting System database was reviewed. and patient was instructed, not to drive, operate heavy machinery, perform activities at heights, swimming or participation in water activities or provide baby-sitting services while on Pain, Sleep and Anxiety Medications; until their outpatient Physician has advised to do so again. Also recommended to not to take more than prescribed Pain, Sleep and Anxiety Medications.   Disposition: Home Diet recommendation:  Discharge Diet Orders (From admission, onward)     Start     Ordered   12/07/21 0000  Diet general        12/07/21 1226           Regular diet DISCHARGE MEDICATION: Allergies as of 12/07/2021       Reactions   Codeine Nausea Only        Medication List     STOP taking these medications    ibuprofen 200 MG tablet Commonly known as: ADVIL   PARoxetine 20 MG tablet Commonly known as: PAXIL       TAKE these medications    acetaminophen 325 MG tablet Commonly known as: TYLENOL Take 650 mg by mouth every 6 (six) hours as needed.   ALPRAZolam 0.25 MG tablet Commonly known as: XANAX Take 1 tablet (0.25 mg total) by mouth 3 (three) times daily as needed for anxiety.   alum & mag hydroxide-simeth 200-200-20 MG/5ML suspension Commonly known as: MAALOX/MYLANTA Take 30 mLs by mouth every 4 (four) hours as needed for indigestion.   amoxicillin-clavulanate 875-125 MG tablet Commonly known as: AUGMENTIN Take 1 tablet by mouth every 12 (twelve) hours for 2 days.   dronabinol 5 MG capsule Commonly known as:  MARINOL Take 1 capsule (5 mg total) by mouth 2 (two) times daily before a meal.   feeding supplement Liqd Take 237 mLs by mouth 3 (three) times daily between meals.   gabapentin 300 MG capsule Commonly known as: NEURONTIN TAKE 1 CAPSULE(300 MG) BY MOUTH AT BEDTIME   midodrine 5 MG tablet Commonly known as: PROAMATINE Take 1 tablet (5 mg total) by mouth 3 (three) times daily with meals.   mirtazapine 15 MG disintegrating tablet Commonly known as: REMERON SOL-TAB Take 1 tablet (15 mg total) by mouth at bedtime.   multivitamin with minerals Tabs tablet Take 1 tablet by mouth daily.   ondansetron 4 MG tablet Commonly known as: ZOFRAN Take 1 tablet (4 mg total) by mouth every 6 (six) hours as needed for nausea.   oxyCODONE 5 MG immediate release tablet Commonly known as: Oxy IR/ROXICODONE Take 1 tablet (5 mg total) by mouth every 4 (four) hours as needed for severe pain.   polyethylene glycol 17 g packet Commonly known as: MiraLax Take 17 g by mouth daily.               Durable Medical Equipment  (  From admission, onward)           Start     Ordered   12/06/21 1033  For home use only DME Walker rolling  Once       Question Answer Comment  Walker: With [place] Wheels   Patient needs a walker to treat with the following condition General weakness      12/06/21 1032            Follow-up Information     Department of Social Services. Call.   Why: Call DSS to arrange/schedule Medicaid Transportation Contact information: [place], [place]. Schedule an appointment as soon as possible for a visit in 1 week(s).   Specialty: General Practice Contact information: [address] [place] [address] [phone]         Volanda Napoleon, MD. Go on 12/09/2021.   Specialty: Oncology Why: on your scheduled appt at the [facility] Contact information: [place]  [id number] [phone]                Discharge Exam: Danley Danker Weights   12/03/21 2202  Weight: 65.8 kg     Condition at discharge: poor  The results of significant diagnostics from this hospitalization (including imaging, microbiology, ancillary and laboratory) are listed below for reference.   Imaging Studies: DG Chest Port 1 View  Result Date: 12/06/2021 CLINICAL DATA:  Dyspnea. EXAM: PORTABLE CHEST 1 VIEW COMPARISON:  March 04, 2022 FINDINGS: There is complete opacification of left hemithorax. Subsequent left-sided volume loss is seen with right to left shift of the mediastinal structures. The heart size and mediastinal contours are subsequently limited in evaluation. The right lung is clear. A chronic fracture deformity is seen involving the distal right clavicle. IMPRESSION: Stable complete opacification of left hemithorax, without acute or active disease seen within the right lung. Electronically Signed   By: Virgina Norfolk M.D.   On: 12/06/2021 21:15   [place] CHEST (PLEURAL EFFUSION)  Result Date: 12/04/2021 CLINICAL DATA:  Evaluate for left pleural effusion. EXAM: CHEST ULTRASOUND COMPARISON:  CT angio chest 12/04/2021. FINDINGS: There is a trace amount of simple fluid identified within the left base. The remaining portions of the imaged left hemithorax appear filled with complex fluid and debris. IMPRESSION: 1. Complex left pleural fluid collection compatible with malignant pleural effusion. Only a trace amount of simple appearing fluid noted at this time. Electronically Signed   By: Kerby Moors M.D.   On: 12/04/2021 10:38   CT Angio Chest PE W and/or Wo Contrast  Result Date: 12/04/2021 CLINICAL DATA:  Hypoxia.  Stage IV lung cancer. EXAM: CT ANGIOGRAPHY CHEST WITH CONTRAST TECHNIQUE: Multidetector CT imaging of the chest was performed using the standard protocol during bolus administration of intravenous contrast. Multiplanar CT image reconstructions and MIPs were obtained  to evaluate the vascular anatomy. RADIATION DOSE REDUCTION: This exam was performed according to the departmental dose-optimization program which includes automated exposure control, adjustment of the mA and/or kV according to patient size and/or use of iterative reconstruction technique. CONTRAST:  71mL OMNIPAQUE IOHEXOL 350 MG/ML SOLN COMPARISON:  05/27/2021 FINDINGS: Cardiovascular: The heart is anteriorly displaced by extensive tumor in the left chest. There is a new masslike filling defect arising from the post wall of the left atrium measuring 3.7 cm. No pulmonary artery filling defect. Shunting versus malignant narrowing of left-sided pulmonary arteries. Mediastinum/Nodes: Confluent adenopathy in the subcarinal mediastinum,  individual nodes are no longer seen. Lungs/Pleura: Extensive tumor in the left lower chest with extensive cavitary features at the lower lobe. Loculated pleural fluid at the left apex. The left upper lobe is completely collapsed with cut off at the left mainstem bronchus where there is tumor. Upper Abdomen: No acute finding. Hepatic venous reflux. Right lobe and infrahepatic caval peripheral contrast is likely from peripheral ization of contrast rather than thrombus. Musculoskeletal: No acute finding.  No bony erosion. Review of the MIP images confirms the above findings. IMPRESSION: 1. Known extensive malignancy in the left chest with mediastinal involvement and left mainstem occlusion. Prominent tumor invasion is now seen in the left atrium, narrowing the chamber. 2. Negative for pulmonary embolism. Electronically Signed   By: Jorje Guild M.D.   On: 12/04/2021 05:22   CT Head Wo Contrast  Result Date: 12/03/2021 CLINICAL DATA:  Lightheadedness. EXAM: CT HEAD WITHOUT CONTRAST TECHNIQUE: Contiguous axial images were obtained from the base of the skull through the vertex without intravenous contrast. RADIATION DOSE REDUCTION: This exam was performed according to the departmental  dose-optimization program which includes automated exposure control, adjustment of the mA and/or kV according to patient size and/or use of iterative reconstruction technique. COMPARISON:  June 05, 2021 FINDINGS: Brain: There is mild cerebral atrophy with widening of the extra-axial spaces and ventricular dilatation. There are areas of decreased attenuation within the white matter tracts of the supratentorial brain, consistent with microvascular disease changes. Vascular: No hyperdense vessel or unexpected calcification. Skull: Normal. Negative for fracture or focal lesion. Sinuses/Orbits: No acute finding. Other: None. IMPRESSION: 1. No acute intracranial abnormality. 2. Generalized cerebral atrophy and microvascular disease changes of the supratentorial brain. Electronically Signed   By: Virgina Norfolk M.D.   On: 12/03/2021 23:52   DG Chest Portable 1 View  Result Date: 12/03/2021 CLINICAL DATA:  Hypoxia EXAM: PORTABLE CHEST 1 VIEW COMPARISON:  Radiograph 06/07/2021 FINDINGS: Similar complete opacification of the left hemithorax with volume loss and deviation of the trachea to the left. Pulmonary vascular congestion in the right lung. No consolidation, pleural effusion, or pneumothorax on the right. The cardiac silhouette is obscured. No acute osseous abnormality. IMPRESSION: 1. Right-sided pulmonary vascular congestion. 2. Unchanged complete opacification of the left hemithorax. Electronically Signed   By: Placido Sou M.D.   On: 12/03/2021 22:58    Microbiology: Results for orders placed or performed during the hospital encounter of 12/03/21  Blood culture (routine x 2)     Status: None (Preliminary result)   Collection Time: 12/03/21 11:06 PM   Specimen: BLOOD  Result Value Ref Range Status   Specimen Description BLOOD BLOOD RIGHT ARM  Final   Special Requests   Final    BOTTLES DRAWN AEROBIC AND ANAEROBIC Blood Culture adequate volume   Culture   Final    NO GROWTH 4 DAYS Performed at  [facility], [address], [place], [place] [address]    Report Status PENDING  Incomplete  Blood culture (routine x 2)     Status: None (Preliminary result)   Collection Time: 12/03/21 11:06 PM   Specimen: BLOOD  Result Value Ref Range Status   Specimen Description BLOOD BLOOD RIGHT ARM  Final   Special Requests   Final    BOTTLES DRAWN AEROBIC AND ANAEROBIC Blood Culture results may not be optimal due to an inadequate volume of blood received in culture bottles   Culture   Final    NO GROWTH 4 DAYS Performed at [facility]  [facility], [address] [place], [place] [address]    Report Status PENDING  Incomplete    Labs: CBC: Recent Labs  Lab 12/03/21 2211 12/04/21 0404 12/05/21 0635 12/06/21 0516  WBC 15.9* 14.0* 14.2* 12.9*  NEUTROABS 12.5*  --   --   --   HGB 12.8* 12.2* 10.3* 9.5*  HCT 41.5 40.0 33.7* 30.9*  MCV 85.9 86.8 87.3 87.0  PLT 474* 377 375 614   Basic Metabolic Panel: Recent Labs  Lab 12/03/21 2211 12/04/21 0404 12/05/21 0635 12/06/21 0516 12/07/21 0538  NA 136 136 140 140 141  K 3.2* 3.3* 3.4* 3.1* 3.5  CL 97* 104 109 111 112*  CO2 27 23 26 26 26   GLUCOSE 135* 122* 99 91 97  BUN 41* 37* 25* 20 17  CREATININE 2.37* 2.04* 1.77* 1.64* 1.68*  CALCIUM >15.0* 14.1* 11.8* 10.8* 11.1*  MG  --   --  1.8 1.8 1.7   Liver Function Tests: Recent Labs  Lab 12/03/21 2211  AST 23  ALT 10  ALKPHOS 93  BILITOT 0.6  PROT 8.1  ALBUMIN 3.1*   CBG: No results for input(s): "GLUCAP" in the last 168 hours.  Discharge time spent: greater than 30 minutes.  Signed: Fritzi Mandes, MD [facility] 12/07/2021

## 2021-12-07 NOTE — Evaluation (Addendum)
Clinical/Bedside Swallow Evaluation Patient Details  Name: Lucas Burke MRN: [id number] Date of Birth: May 31, 1960  Today's Date: 12/07/2021 Time: SLP Start Time (ACUTE ONLY): 33 SLP Stop Time (ACUTE ONLY): 1135 SLP Time Calculation (min) (ACUTE ONLY): 45 min  Past Medical History:  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, left ([place]) 06/22/2021   Panic attacks    Past Surgical History:  Past Surgical History:  Procedure Laterality Date   COLONOSCOPY  2010   DECORTICATION Left 06/01/2021   Procedure: DECORTICATION;  Surgeon: Dahlia Byes, MD;  Location: [place];  Service: Thoracic;  Laterality: Left;   PLEURAL BIOPSY Left 06/01/2021   Procedure: PLEURAL BIOPSY;  Surgeon: Dahlia Byes, MD;  Location: [place];  Service: Thoracic;  Laterality: Left;   VIDEO ASSISTED THORACOSCOPY (VATS)/EMPYEMA Left 06/01/2021   Procedure: VIDEO ASSISTED THORACOSCOPY (VATS)/EMPYEMA - APPLICATION OF ON-Q PAIN PUMP;  Surgeon: Dahlia Byes, MD;  Location: [place];  Service: Thoracic;  Laterality: Left;   VIDEO BRONCHOSCOPY N/A 06/01/2021   Procedure: VIDEO BRONCHOSCOPY;  Surgeon: Dahlia Byes, MD;  Location: [place];  Service: Thoracic;  Laterality: N/A;   HPI:  Pt  is a 61 y.o. 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  Most of the history is given by the daughter over the phone who states that over the past 2 weeks her father has been very weak and falling a lot.  He has ongoing nausea and he has not been eating and probably getting in just 1 meal a day.  She also noted that he has been very confused.  She states he has a cough but it is no worse than the smoker's cough he has had for  several years.  He is also constipated.  He has no fever or chills and has not vomited or complained of abdominal pain.  Daughter is concerned that her father might be very depressed.  He has been missing his oncology appointments.   CXR: Stable complete opacification of left hemithorax, without acute nor active disease seen within the right lung.    Assessment / Plan / Recommendation  Clinical Impression   Pt seen for BSE this morning post report of difficulty swallowing Pills w/ water to MD. Pt reported same at home also. Pt stated he takes more Pills "now"; noted recent hospitalizations, dx of Lung Cancer.  Pt awake, verbal and engaged easily w/ w/ this Clinician.  Pt on RA; afebrile. He is on a Regular consistency diet.    Pt appears to present w/ adequate oropharyngeal phase swallow function w/ No gross overt oropharyngeal phase dysphagia noted, No neuromuscular deficits noted when following general aspiration precautions including monitoring Straw use when drinking. Pt consumed po trials w/ No immediate, overt clinical s/s of aspiration during po trials. Pt appears at reduced risk for aspiration following general aspiration precautions and reducing distractions during meals. Discussed increasing focus to the eating/drinking task at hand using Single sips slowly. Pt demonstrated understanding of precautions and exhibited good follow through w/ instructions.  ALSO: pt has been having N/V recently which may increase Esophageal irritation and Dysmotility. Recommend f/u w/ GI, Oncologist re: this as any  Esophageal phase Dysmotility can impact safety of swallowing d/t the Regurgitation impact on pharyngo-esophageal tissue and sensation.   During po trials, pt consumed all consistencies w/ no immediate, overt coughing, decline in vocal quality, or change in respiratory presentation during/post trials. Mild, delayed throat clearing noted x1 w/ straw use - this did not increase in intensity. Oral phase  appeared grossly [facility] w/ timely bolus management, mashing/gumming of foods, and control of bolus propulsion for A-P transfer for swallowing. Oral clearing achieved w/ all trial consistencies -- moistened, soft foods given. Pt is Edentulous at baseline and required min extra Time w/ increased textured foods during the oral phase.  OM Exam appeared [facility] w/ no unilateral weakness noted. Speech Clear. Pt fed self w/ setup support.  Education was given on Pills CRUSHED vs Whole in Puree for safer swallowing than w/ liquids as pt has had swallowing difficulty w/ Pills w/ liquids. NSG has CRUSHED the Pills and mixed in a Puree which pt seems to prefer -- will send home Pill Crusher. NSG to f/u on crushability of his meds. Pt seemed pleased w/ this new option for swallowing Pills more safely.   Recommend continue a mech soft/regular consistency diet w/ well-Cut meats, moistened foods(more options); Thin liquids. Monitor straw use. Recommend general aspiration precautions, Reflux precautions. Pills CRUSHED in Puree for safer, easier swallowing. Recommend monitoring fatigue during meals and choose foods easy to eat for conservation of energy and d/t Edentulous status. Thorough Education given on Pills in Puree; food consistencies and easy to eat options; general aspiration precautions and reducing distractions during meals for increased focus to swallowing for pt. NSG to reconsult if any new needs arise. NSG updated, agreed. MD updated.  SLP Visit Diagnosis: Dysphagia, unspecified (R13.10) (Edentulous at Baseline)    Aspiration Risk  Mild aspiration risk;Risk for inadequate nutrition/hydration (reduced following general aspiration precautions)    Diet Recommendation   mech soft/regular consistency diet w/ well-Cut meats, moistened foods(more options); Thin liquids. Monitor straw use. Recommend general aspiration precautions, Reflux precautions. Recommend monitoring fatigue during meals and choose foods easy to eat  for conservation of energy and d/t Edentulous status.  Medication Administration: Crushed with puree (for ease of swallowing)    Other  Recommendations Recommended Consults:  ([place] f/u) Oral Care Recommendations: Oral care BID;Oral care before and after PO;Staff/trained caregiver to provide oral care (setup/support) Other Recommendations:  (n/a)    Recommendations for follow up therapy are one component of a multi-disciplinary discharge planning process, led by the attending physician.  Recommendations may be updated based on patient status, additional functional criteria and insurance authorization.  Follow up Recommendations No SLP follow up      Assistance Recommended at Discharge Set up Supervision/Assistance  Functional Status Assessment Patient has had a recent decline in their functional status and demonstrates the ability to make significant improvements in function in a reasonable and predictable amount of time.  Frequency and Duration  (n/a)   (n/a)       Prognosis Prognosis for Safe Diet Advancement: Fair Barriers to Reach Goals: Severity of deficits;Time post onset Barriers/Prognosis Comment: baseline Lunch Cancer; deconditioned status      Swallow Study   General Date of Onset: 12/04/21 HPI: Pt  is a 61 y.o. 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  Most of the history is given by the daughter over  the phone who states that over the past 2 weeks her father has been very weak and falling a lot.  He has ongoing nausea and he has not been eating and probably getting in just 1 meal a day.  She also noted that he has been very confused.  She states he has a cough but it is no worse than the smoker's cough he has had for several years.  He is also constipated.  He has no fever or chills and has not vomited or complained of  abdominal pain.  Daughter is concerned that her father might be very depressed.  He has been missing his oncology appointments.   CXR: Stable complete opacification of left hemithorax, without acute nor active disease seen within the right lung. Type of Study: Bedside Swallow Evaluation Previous Swallow Assessment: 05/2021 x2; mech soft/regular diet w/ thins recommended Diet Prior to this Study: Regular;Thin liquids Temperature Spikes Noted: No Respiratory Status: Room air History of Recent Intubation: No Behavior/Cognition: Alert;Cooperative;Pleasant mood Oral Cavity Assessment: Within Functional Limits Oral Care Completed by SLP: Recent completion by staff Oral Cavity - Dentition: Edentulous (baseline) Vision: Functional for self-feeding Self-Feeding Abilities: Able to feed self;Needs set up Patient Positioning: Upright in bed (propped more upright) Baseline Vocal Quality: Normal Volitional Cough: Strong Volitional Swallow: Able to elicit    Oral/Motor/Sensory Function Overall Oral Motor/Sensory Function: Within functional limits   Ice Chips Ice chips: Not tested   Thin Liquid Thin Liquid: Within functional limits Presentation: Cup;Self Fed;Straw (5 trials via each) Other Comments: delayed throat clear x1 post straw drinking    Nectar Thick Nectar Thick Liquid: Not tested   Honey Thick Honey Thick Liquid: Not tested   Puree Puree: Within functional limits Presentation: Self Fed;Spoon (6-7 trials)   Solid     Solid: Within functional limits (w/ small, moistened pieces d/t edentulous status) Presentation: Self Fed (5 trials)         Lucas Kenner, MS, CCC-SLP Speech Language Pathologist Rehab Services; [place] [phone] (ascom) Lucas Burke 12/07/2021,12:47 PM

## 2021-12-08 LAB — CULTURE, BLOOD (ROUTINE X 2)
Culture: NO GROWTH
Culture: NO GROWTH
Special Requests: ADEQUATE

## 2021-12-09 ENCOUNTER — Inpatient Hospital Stay: Payer: Medicaid Other

## 2021-12-09 ENCOUNTER — Inpatient Hospital Stay: Payer: Medicaid Other | Attending: Hematology & Oncology | Admitting: Hematology & Oncology

## 2021-12-15 ENCOUNTER — Encounter: Payer: Self-pay | Admitting: *Deleted

## 2021-12-15 NOTE — Progress Notes (Signed)
Patient has been a no-show to his last several appointments and his most recent CT scan. Attempts to make contact with him and his daughter have been unsuccessful. He will be due to refills on his controlled substances shortly.  Discussed with Dr Marin Olp and his desk nurse. All refills will be placed on hold until patient is seen in the office.   Oncology Nurse Navigator Documentation     12/15/2021    1:00 PM  Oncology Nurse Navigator Flowsheets  Navigator Location [facility]  Navigator Encounter Type Other:  Patient Visit Type MedOnc  Treatment Phase Active Tx  Barriers/Navigation Needs Coordination of Care;Education;Language/Communication;Personal Conflicts  Interventions Coordination of Care  Acuity Level 4-High Needs (Greater Than 4 Barriers Identified)  Coordination of Care Other  Support Groups/Services Friends and Family  Time Spent with Patient 15

## 2021-12-16 ENCOUNTER — Telehealth: Payer: Self-pay

## 2021-12-16 NOTE — Telephone Encounter (Signed)
CSW attempted to contact patient to assess psychosocial needs.  Left vm.

## 2021-12-17 ENCOUNTER — Inpatient Hospital Stay
Admission: EM | Admit: 2021-12-17 | Discharge: 2021-12-21 | DRG: 640 | Disposition: A | Discharge status: Hospice | Payer: Medicaid Other | Attending: Hospitalist | Admitting: Hospitalist

## 2021-12-17 ENCOUNTER — Emergency Department: Payer: Medicaid Other

## 2021-12-17 DIAGNOSIS — I2489 Other forms of acute ischemic heart disease: Secondary | ICD-10-CM | POA: Diagnosis present

## 2021-12-17 DIAGNOSIS — E43 Unspecified severe protein-calorie malnutrition: Secondary | ICD-10-CM | POA: Diagnosis present

## 2021-12-17 DIAGNOSIS — Z23 Encounter for immunization: Secondary | ICD-10-CM

## 2021-12-17 DIAGNOSIS — Z885 Allergy status to narcotic agent status: Secondary | ICD-10-CM

## 2021-12-17 DIAGNOSIS — C3492 Malignant neoplasm of unspecified part of left bronchus or lung: Secondary | ICD-10-CM | POA: Diagnosis present

## 2021-12-17 DIAGNOSIS — Z20822 Contact with and (suspected) exposure to covid-19: Secondary | ICD-10-CM | POA: Diagnosis present

## 2021-12-17 DIAGNOSIS — Z66 Do not resuscitate: Secondary | ICD-10-CM | POA: Diagnosis present

## 2021-12-17 DIAGNOSIS — I959 Hypotension, unspecified: Secondary | ICD-10-CM | POA: Diagnosis not present

## 2021-12-17 DIAGNOSIS — F410 Panic disorder [episodic paroxysmal anxiety] without agoraphobia: Secondary | ICD-10-CM | POA: Diagnosis present

## 2021-12-17 DIAGNOSIS — E860 Dehydration: Secondary | ICD-10-CM | POA: Diagnosis present

## 2021-12-17 DIAGNOSIS — Z79899 Other long term (current) drug therapy: Secondary | ICD-10-CM | POA: Diagnosis not present

## 2021-12-17 DIAGNOSIS — G8929 Other chronic pain: Secondary | ICD-10-CM | POA: Diagnosis present

## 2021-12-17 DIAGNOSIS — E876 Hypokalemia: Secondary | ICD-10-CM | POA: Diagnosis present

## 2021-12-17 DIAGNOSIS — Z7189 Other specified counseling: Secondary | ICD-10-CM | POA: Diagnosis not present

## 2021-12-17 DIAGNOSIS — R531 Weakness: Secondary | ICD-10-CM | POA: Diagnosis not present

## 2021-12-17 DIAGNOSIS — N179 Acute kidney failure, unspecified: Secondary | ICD-10-CM | POA: Diagnosis present

## 2021-12-17 DIAGNOSIS — Z515 Encounter for palliative care: Secondary | ICD-10-CM

## 2021-12-17 DIAGNOSIS — J910 Malignant pleural effusion: Secondary | ICD-10-CM | POA: Diagnosis present

## 2021-12-17 DIAGNOSIS — Z87891 Personal history of nicotine dependence: Secondary | ICD-10-CM

## 2021-12-17 DIAGNOSIS — I9589 Other hypotension: Secondary | ICD-10-CM | POA: Diagnosis present

## 2021-12-17 DIAGNOSIS — M549 Dorsalgia, unspecified: Secondary | ICD-10-CM | POA: Diagnosis present

## 2021-12-17 DIAGNOSIS — Z681 Body mass index (BMI) 19 or less, adult: Secondary | ICD-10-CM

## 2021-12-17 DIAGNOSIS — C3490 Malignant neoplasm of unspecified part of unspecified bronchus or lung: Secondary | ICD-10-CM | POA: Diagnosis not present

## 2021-12-17 LAB — URINE DRUG SCREEN, QUALITATIVE (ARMC ONLY)
Amphetamines, Ur Screen: NOT DETECTED
Barbiturates, Ur Screen: NOT DETECTED
Benzodiazepine, Ur Scrn: NOT DETECTED
Cannabinoid 50 Ng, Ur ~~LOC~~: NOT DETECTED
Cocaine Metabolite,Ur ~~LOC~~: NOT DETECTED
MDMA (Ecstasy)Ur Screen: NOT DETECTED
Methadone Scn, Ur: NOT DETECTED
Opiate, Ur Screen: POSITIVE — AB
Phencyclidine (PCP) Ur S: NOT DETECTED
Tricyclic, Ur Screen: NOT DETECTED

## 2021-12-17 LAB — COMPREHENSIVE METABOLIC PANEL
ALT: 18 U/L (ref 0–44)
AST: 53 U/L — ABNORMAL HIGH (ref 15–41)
Albumin: 2.7 g/dL — ABNORMAL LOW (ref 3.5–5.0)
Alkaline Phosphatase: 96 U/L (ref 38–126)
Anion gap: 8 (ref 5–15)
BUN: 25 mg/dL — ABNORMAL HIGH (ref 8–23)
CO2: 28 mmol/L (ref 22–32)
Calcium: >15 mg/dL (ref 8.9–10.3)
Chloride: 104 mmol/L (ref 98–111)
Creatinine, Ser: 2.12 mg/dL — ABNORMAL HIGH (ref 0.61–1.24)
GFR, Estimated: 35 mL/min — ABNORMAL LOW (ref >60)
Glucose, Bld: 108 mg/dL — ABNORMAL HIGH (ref 70–99)
Potassium: 3.3 mmol/L — ABNORMAL LOW (ref 3.5–5.1)
Sodium: 140 mmol/L (ref 135–145)
Total Bilirubin: 0.6 mg/dL (ref 0.3–1.2)
Total Protein: 8 g/dL (ref 6.5–8.1)

## 2021-12-17 LAB — URINALYSIS, ROUTINE W REFLEX MICROSCOPIC
Bacteria, UA: NONE SEEN
Bilirubin Urine: NEGATIVE
Glucose, UA: NEGATIVE mg/dL
Ketones, ur: NEGATIVE mg/dL
Leukocytes,Ua: NEGATIVE
Nitrite: NEGATIVE
Protein, ur: 100 mg/dL — AB
Specific Gravity, Urine: 1.017 (ref 1.005–1.030)
pH: 5 (ref 5.0–8.0)

## 2021-12-17 LAB — SARS CORONAVIRUS 2 BY RT PCR: SARS Coronavirus 2 by RT PCR: NEGATIVE

## 2021-12-17 LAB — CBC WITH DIFFERENTIAL/PLATELET
Abs Immature Granulocytes: 0.15 10*3/uL — ABNORMAL HIGH (ref 0.00–0.07)
Basophils Absolute: 0.1 10*3/uL (ref 0.0–0.1)
Basophils Relative: 0 %
Eosinophils Absolute: 0 10*3/uL (ref 0.0–0.5)
Eosinophils Relative: 0 %
HCT: 41.7 % (ref 39.0–52.0)
Hemoglobin: 12.6 g/dL — ABNORMAL LOW (ref 13.0–17.0)
Immature Granulocytes: 1 %
Lymphocytes Relative: 6 %
Lymphs Abs: 1.2 10*3/uL (ref 0.7–4.0)
MCH: 26.6 pg (ref 26.0–34.0)
MCHC: 30.2 g/dL (ref 30.0–36.0)
MCV: 88 fL (ref 80.0–100.0)
Monocytes Absolute: 1.3 10*3/uL — ABNORMAL HIGH (ref 0.1–1.0)
Monocytes Relative: 7 %
Neutro Abs: 16.4 10*3/uL — ABNORMAL HIGH (ref 1.7–7.7)
Neutrophils Relative %: 86 %
Platelets: 471 10*3/uL — ABNORMAL HIGH (ref 150–400)
RBC: 4.74 MIL/uL (ref 4.22–5.81)
RDW: 13.5 % (ref 11.5–15.5)
WBC: 19.1 10*3/uL — ABNORMAL HIGH (ref 4.0–10.5)
nRBC: 0 % (ref 0.0–0.2)

## 2021-12-17 LAB — AMMONIA: Ammonia: 14 umol/L (ref 9–35)

## 2021-12-17 LAB — LACTIC ACID, PLASMA
Lactic Acid, Venous: 2.2 mmol/L (ref 0.5–1.9)
Lactic Acid, Venous: 2 mmol/L (ref 0.5–1.9)

## 2021-12-17 LAB — CK: Total CK: 775 U/L — ABNORMAL HIGH (ref 49–397)

## 2021-12-17 LAB — MAGNESIUM: Magnesium: 2.5 mg/dL — ABNORMAL HIGH (ref 1.7–2.4)

## 2021-12-17 LAB — TROPONIN I (HIGH SENSITIVITY)
Troponin I (High Sensitivity): 834 ng/L (ref <18)
Troponin I (High Sensitivity): 978 ng/L (ref <18)

## 2021-12-17 LAB — CALCIUM: Calcium: 12.2 mg/dL — ABNORMAL HIGH (ref 8.9–10.3)

## 2021-12-17 LAB — ETHANOL: Alcohol, Ethyl (B): <10 mg/dL (ref <10)

## 2021-12-17 MED ORDER — DOCUSATE SODIUM 100 MG PO CAPS: 100.0000 mg | ORAL_CAPSULE | Freq: Two times a day (BID) | ORAL | Status: DC | PRN

## 2021-12-17 MED ORDER — POLYETHYLENE GLYCOL 3350 17 G PO PACK: 17.0000 g | PACK | Freq: Two times a day (BID) | ORAL | Status: DC | PRN

## 2021-12-17 MED ORDER — ENSURE ENLIVE PO LIQD
237.0000 mL | Freq: Three times a day (TID) | ORAL | Status: DC
  Administered 2021-12-17 – 2021-12-21 (×11): 237 mL via ORAL

## 2021-12-17 MED ORDER — OXYCODONE HCL 5 MG PO TABS
5.0000 mg | ORAL_TABLET | ORAL | Status: DC | PRN
  Administered 2021-12-19 – 2021-12-21 (×5): 5 mg via ORAL
  Filled 2021-12-17 (×5): qty 1

## 2021-12-17 MED ORDER — ZOLEDRONIC ACID 4 MG/5ML IV CONC
4.0000 mg | Freq: Once | INTRAVENOUS | Status: AC
  Administered 2021-12-17: 4 mg via INTRAVENOUS
  Filled 2021-12-17: qty 5

## 2021-12-17 MED ORDER — ONDANSETRON 4 MG PO TBDP: 4.0000 mg | ORAL_TABLET | Freq: Three times a day (TID) | ORAL | Status: DC | PRN

## 2021-12-17 MED ORDER — GABAPENTIN 300 MG PO CAPS
300.0000 mg | ORAL_CAPSULE | Freq: Every day | ORAL | Status: DC
  Administered 2021-12-17 – 2021-12-20 (×4): 300 mg via ORAL
  Filled 2021-12-17 (×4): qty 1

## 2021-12-17 MED ORDER — SODIUM CHLORIDE 0.9 % IV SOLN: INTRAVENOUS | Status: DC

## 2021-12-17 MED ORDER — SODIUM CHLORIDE 0.9 % IV BOLUS
1000.0000 mL | Freq: Once | INTRAVENOUS | Status: AC
  Administered 2021-12-17: 1000 mL via INTRAVENOUS

## 2021-12-17 MED ORDER — ENOXAPARIN SODIUM 40 MG/0.4ML IJ SOSY
40.0000 mg | PREFILLED_SYRINGE | INTRAMUSCULAR | Status: DC
  Administered 2021-12-17 – 2021-12-19 (×3): 40 mg via SUBCUTANEOUS
  Filled 2021-12-17 (×3): qty 0.4

## 2021-12-17 MED ORDER — CALCITONIN (SALMON) 200 UNIT/ML IJ SOLN
4.0000 [IU]/kg | Freq: Two times a day (BID) | INTRAMUSCULAR | Status: AC
  Administered 2021-12-17 – 2021-12-18 (×4): 260 [IU] via INTRAMUSCULAR
  Filled 2021-12-17 (×5): qty 1.3

## 2021-12-17 MED ORDER — TETANUS-DIPHTH-ACELL PERTUSSIS 5-2.5-18.5 LF-MCG/0.5 IM SUSY
0.5000 mL | PREFILLED_SYRINGE | Freq: Once | INTRAMUSCULAR | Status: AC
  Administered 2021-12-17: 0.5 mL via INTRAMUSCULAR
  Filled 2021-12-17: qty 0.5

## 2021-12-17 MED ORDER — ONDANSETRON HCL 4 MG/2ML IJ SOLN
4.0000 mg | Freq: Four times a day (QID) | INTRAMUSCULAR | Status: DC | PRN
  Administered 2021-12-18 – 2021-12-20 (×2): 4 mg via INTRAVENOUS
  Filled 2021-12-17 (×2): qty 2

## 2021-12-17 MED ORDER — ACETAMINOPHEN 500 MG PO TABS
1000.0000 mg | ORAL_TABLET | Freq: Three times a day (TID) | ORAL | Status: DC | PRN
  Administered 2021-12-17: 1000 mg via ORAL
  Filled 2021-12-17: qty 2

## 2021-12-17 MED ORDER — MIRTAZAPINE 15 MG PO TBDP
15.0000 mg | ORAL_TABLET | Freq: Every day | ORAL | Status: DC
  Administered 2021-12-17 – 2021-12-20 (×4): 15 mg via ORAL
  Filled 2021-12-17 (×5): qty 1

## 2021-12-17 MED ORDER — MIDODRINE HCL 5 MG PO TABS
5.0000 mg | ORAL_TABLET | Freq: Three times a day (TID) | ORAL | Status: DC
  Administered 2021-12-17 – 2021-12-21 (×11): 5 mg via ORAL
  Filled 2021-12-17 (×10): qty 1

## 2021-12-17 NOTE — ED Triage Notes (Signed)
61 y/o M arrives via [employer].  Lives at home with 69 y/o daughter who states she found him on the floor, unwitnessed fall and AMS.  EMS states he is a stage 4 lung cancer patient.   Has not ate much over the past week per daughter, but has drank lemonade and soda.

## 2021-12-17 NOTE — ED Notes (Signed)
[facility] DSS to bedside to interview pt.

## 2021-12-17 NOTE — H&P (Signed)
History and Physical    [name]:[id number] DOB: 02-Mar-1961 DOA: 12/17/2021  [facility]P: [facility] [name]  Patient coming from: home  I have personally briefly reviewed patient's old medical records in Lucas Burke  Chief Complaint: found down  HPI: Lucas Burke is a 61 y.o. 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 daughter who called EMS.    Pt said he has been feeling weak, admitted to not eating much both due to lack of appetite and not having his dentures (his separated wife took them).  Reported of chest pain on both sides.  No fever, dyspnea, abdominal pain, N/V/D, dysuria.  Still having urine output.  Pt said at baseline he gets around on his own and that his 69 y.o daughter can take care of him.  Of note, pt had a recent hospitalization for hypercalcemia and was discharged on 12/07/21 with Ca of 11.1.  ED Course: initial vitals: afebrile, pulse 85, BP 88/63, RR 16, sating 100% on room air.  Labs notable for Cr 2.12, Calcium >15, WBC 19.1, trop 834 and 978.  CXR showed Complete opacification of the left hemithorax, unchanged from the prior chest radiograph of 12/06/2021.  Pt received NS x2 L in the ED before admission.   Assessment/Plan * Hypercalcemia of malignancy --Calcium >15 on presentation, similar to last hospitalization.  Due to Stage IV lung cancer. Plan: --zoledronic acid x1  --IM calcitonin BID for 48 hours --cont aggressive IVF @125  ml/hr  AKI --Cr 2.12 on presentation.  Baseline around 1.4.  Likely due to dehydration from hypercalcemia. --s/p 2L NS in the ED --cont MIVF@125    Hypotension, chronic --cont home midodrine --cont MIVF  Weakness 2/2 Malignancy and poor nutrition --PT/OT --Ensure TID   Troponin elevation 2/2 demand ischemia --trop 800's and flat.  ED  provider discussed with oncall cardiology, who deemed it demand ischemia.  Protein calorie malnutrition, severe ([place]) Secondary to malignancy and poor oral intake --supplements   Lung cancer, primary, with metastasis from lung to other site, left [facility]) --outpatient f/u with primary oncologist [facility])   DVT prophylaxis: Lovenox SQ Code Status: Full code Pt confirmed he wanted everything done. Family Communication:   Disposition Plan: to be determined  Consults called: none Level of care: Med-Surg   Review of Systems: As per HPI otherwise complete review of systems negative.   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, left ([place]) 06/22/2021   Panic attacks     Past Surgical History:  Procedure Laterality Date   COLONOSCOPY  2010   DECORTICATION Left 06/01/2021   Procedure: DECORTICATION;  Surgeon: Dahlia Byes, MD;  Location: [place];  Service: Thoracic;  Laterality: Left;   PLEURAL BIOPSY Left 06/01/2021   Procedure: PLEURAL BIOPSY;  Surgeon: Dahlia Byes, MD;  Location: [place];  Service: Thoracic;  Laterality: Left;   VIDEO ASSISTED THORACOSCOPY (VATS)/EMPYEMA Left 06/01/2021   Procedure: VIDEO ASSISTED THORACOSCOPY (VATS)/EMPYEMA - APPLICATION OF ON-Q PAIN PUMP;  Surgeon: Dahlia Byes, MD;  Location: [place];  Service: Thoracic;  Laterality: Left;   VIDEO BRONCHOSCOPY N/A 06/01/2021   Procedure: VIDEO BRONCHOSCOPY;  Surgeon: Dahlia Byes, MD;  Location: [place];  Service: Thoracic;  Laterality: N/A;     reports that he quit smoking about 10 months ago. His smoking use included cigarettes. He smoked an average of 2 packs per day. He has never used smokeless tobacco. He reports that he does not currently use alcohol. He reports that he does not use drugs.  Allergies  Allergen Reactions   Codeine  Nausea Only    No family history on file.   Prior to Admission medications   Medication Sig Start Date End Date Taking? Authorizing Provider  acetaminophen (TYLENOL) 325 MG tablet Take 650 mg by mouth every 6 (six) hours as needed.    [provider]  ALPRAZolam Duanne Moron) 0.25 MG tablet Take 1 tablet (0.25 mg total) by mouth 3 (three) times daily as needed for anxiety. 09/24/21   Volanda Napoleon, MD  alum & mag hydroxide-simeth (MAALOX/MYLANTA) 200-200-20 MG/5ML suspension Take 30 mLs by mouth every 4 (four) hours as needed for indigestion. 05/30/21   Ezekiel Slocumb, DO  dronabinol (MARINOL) 5 MG capsule Take 1 capsule (5 mg total) by mouth 2 (two) times daily before a meal. 11/03/21   Ennever, Rudell Cobb, MD  feeding supplement (ENSURE ENLIVE / ENSURE PLUS) LIQD Take 237 mLs by mouth 3 (three) times daily between meals. 05/30/21   Nicole Kindred A, DO  gabapentin (NEURONTIN) 300 MG capsule TAKE 1 CAPSULE(300 MG) BY MOUTH AT BEDTIME 11/30/21   Volanda Napoleon, MD  midodrine (PROAMATINE) 5 MG tablet Take 1 tablet (5 mg total) by mouth 3 (three) times daily with meals. 11/22/21   Volanda Napoleon, MD  mirtazapine (REMERON SOL-TAB) 15 MG disintegrating tablet Take 1 tablet (15 mg total) by mouth at bedtime. 12/07/21   Fritzi Mandes, MD  Multiple Vitamin (MULTIVITAMIN WITH MINERALS) TABS tablet Take 1 tablet by mouth daily. 05/31/21   Nicole Kindred A, DO  ondansetron (ZOFRAN) 4 MG tablet Take 1 tablet (4 mg total) by mouth every 6 (six) hours as needed for nausea. 10/15/21   Volanda Napoleon, MD  oxyCODONE (OXY IR/ROXICODONE) 5 MG immediate release tablet Take 1 tablet (5 mg total) by mouth every 4 (four) hours as needed for severe pain. 11/22/21   Volanda Napoleon, MD  polyethylene glycol (MIRALAX) 17 g packet Take 17 g by mouth daily. 11/22/21   Volanda Napoleon, MD    Physical Exam: Vitals:   12/17/21 1100 12/17/21 1115 12/17/21 1130 12/17/21 1145  BP:  (!) 87/60 (!) 88/58 (!) 89/63  Pulse:   74 76 77  Resp: 17 17 17 18   Temp:      TempSrc:      SpO2:  96% 94% 94%  Weight:        Constitutional: NAD, AAOx3, cachetic HEENT: conjunctivae and lids normal, EOMI CV: No cyanosis.   RESP: normal respiratory effort, on RA SKIN: warm, dry Neuro: II - XII grossly intact.   Psych: depressed mood and affect.    Labs on Admission: I have personally reviewed labs and imaging studies  Time spent: 75 minutes  Enzo Bi MD [facility]  If 7PM-7AM, please contact night-coverage 12/17/2021, 2:00 PM

## 2021-12-17 NOTE — ED Provider Notes (Signed)
[facility] Provider Note    Event Date/Time   First MD Initiated Contact with Patient 12/17/21 [phone]     (approximate)   History   Fall   HPI  YAW ESCOTO is a 61 y.o. male with history of stage IV lung cancer with malignant pleural effusion who comes in with concerns for a fall.  Patient was found down by daughter.  It sounds over the past week patient has not been eating or drinking has had increasing failure to thrive.  There was concern of oxygen levels in the 70s patient was placed on 3 L of oxygen.  Patient is alert and oriented x2.  He states that he is not sure why he fell.  He denies being on any blood thinner.  I reviewed the records where patient was in the hospital the end of September for hypercalcemia.  He was also placed on antibiotics for possible pneumonia.   Physical Exam   Triage Vital Signs: Blood pressure (!) 88/62, pulse 85, temperature 97.7 F (36.5 C), temperature source Axillary, resp. rate 16, weight 65 kg, SpO2 94 %.   Most recent vital signs: Vitals:   12/17/21 0827  SpO2: 100%     General: Awake, no distress.  Alert and oriented x2 CV:  Good peripheral perfusion.  Resp:  Normal effort.  Abd:  No distention.  Soft and nontender Other:  No swelling the legs.  No calf tenderness Patient is cachectic appearing.  Patient has bruising on his forehead.  He has a blood noted on his anterior part of his nose.  ED Results / Procedures / Treatments   Labs (all labs ordered are listed, but only abnormal results are displayed) Labs Reviewed  SARS CORONAVIRUS 2 BY RT PCR  CBC WITH DIFFERENTIAL/PLATELET  COMPREHENSIVE METABOLIC PANEL  AMMONIA  ETHANOL  URINE DRUG SCREEN, QUALITATIVE ([facility] ONLY)  URINALYSIS, ROUTINE W REFLEX MICROSCOPIC  MAGNESIUM  CK  TROPONIN I (HIGH SENSITIVITY)     EKG  My interpretation of EKG:  Normal sinus rate of 83 without any ST elevation or T wave inversions, normal intervals but a lot  of artifact in V2 therefore will get repeat  Normal sinus rate of 78 without any significant ST elevation or T wave inversions, normal intervals.  They are calling some ST elevation in inferior leads but I do not really appreciate that.   RADIOLOGY I have reviewed the xray personally and interpreted and patient does have opacified left hemothorax baseline for patient  PROCEDURES:  Critical Care performed: Yes, see critical care procedure note(s)  .1-3 Lead EKG Interpretation  Performed by: Vanessa [place], MD Authorized by: Vanessa [place], MD     Interpretation: normal     ECG rate:  80   ECG rate assessment: normal     Rhythm: sinus rhythm     Ectopy: none     Conduction: normal   .Critical Care  Performed by: Vanessa [place], MD Authorized by: Vanessa [place], MD   Critical care provider statement:    Critical care time (minutes):  30   Critical care was necessary to treat or prevent imminent or life-threatening deterioration of the following conditions:  Endocrine crisis   Critical care was time spent personally by me on the following activities:  Development of treatment plan with patient or surrogate, discussions with consultants, evaluation of patient's response to treatment, examination of patient, ordering and review of laboratory studies, ordering and review of radiographic studies,  ordering and performing treatments and interventions, pulse oximetry, re-evaluation of patient's condition and review of old [place] ED: Medications  sodium chloride 0.9 % bolus 1,000 mL (has no administration in time range)  sodium chloride 0.9 % bolus 1,000 mL (1,000 mLs Intravenous New Bag/Given 12/17/21 0900)  Tdap (BOOSTRIX) injection 0.5 mL (0.5 mLs Intramuscular Given 12/17/21 0939)     IMPRESSION / MDM / ASSESSMENT AND PLAN / ED COURSE  I reviewed the triage vital signs and the nursing notes.   Patient's presentation is most consistent with acute presentation  with potential threat to life or bodily function.   Differential includes failure to thrive from dehydration hypercalcemia, UTI.  Altered mental status most likely secondary to these but given trauma noted to the head will get CT imaging to evaluate for any intracranial hemorrhage, cervical fracture, facial fracture.  Patient just recently had a CT PE that was negative for pulmonary embolism and his abdomen is soft and nontender.  Chest x-ray unchanged from prior with whited out lung secondary to cancer.  CT head face and neck negative for acute findings.  Ethanol negative.  Ammonia normal.  White count elevated.  Still waiting on urine.  Will get blood cultures and lactate but does not meet SIRS criteria.  He does have low blood pressure but I suspect this is more likely secondary to dehydration.  His calcium is significantly elevated greater than 15 with a new AKI so patient getting 2 L of fluid.  Patient still mentating well.  His troponin was incidentally noted to be elevated 834.  Review evaluated patient he denies any chest pain or shortness of breath.  Discussed the case with Dr. Rockey Situ given the abnormal EKGs but he felt like they looked like his prior EKGs and did not feel that this represented a STEMI.  I will discuss with hospital team for admission  The patient is on the cardiac monitor to evaluate for evidence of arrhythmia and/or significant heart rate changes.      FINAL CLINICAL IMPRESSION(S) / ED DIAGNOSES   Final diagnoses:  Hypotension, unspecified hypotension type  Hypercalcemia  AKI (acute kidney injury) ([place])     Rx / DC Orders   ED Discharge Orders     None        Note:  This document was prepared using Dragon voice recognition software and may include unintentional dictation errors.   Vanessa [place], MD 12/17/21 1018

## 2021-12-17 NOTE — ED Notes (Signed)
Critical lab Lactic acid 2.2 Dr. Billie Ruddy notified 1200

## 2021-12-17 NOTE — ED Notes (Signed)
Informed RN bed assigned 

## 2021-12-17 NOTE — ED Notes (Signed)
Request made for transport to the floor ?

## 2021-12-18 LAB — BASIC METABOLIC PANEL
Anion gap: 6 (ref 5–15)
BUN: 23 mg/dL (ref 8–23)
CO2: 24 mmol/L (ref 22–32)
Calcium: 11.9 mg/dL — ABNORMAL HIGH (ref 8.9–10.3)
Chloride: 111 mmol/L (ref 98–111)
Creatinine, Ser: 1.46 mg/dL — ABNORMAL HIGH (ref 0.61–1.24)
GFR, Estimated: 54 mL/min — ABNORMAL LOW (ref >60)
Glucose, Bld: 98 mg/dL (ref 70–99)
Potassium: 3 mmol/L — ABNORMAL LOW (ref 3.5–5.1)
Sodium: 141 mmol/L (ref 135–145)

## 2021-12-18 LAB — CBC
HCT: 33.6 % — ABNORMAL LOW (ref 39.0–52.0)
Hemoglobin: 10.2 g/dL — ABNORMAL LOW (ref 13.0–17.0)
MCH: 26.1 pg (ref 26.0–34.0)
MCHC: 30.4 g/dL (ref 30.0–36.0)
MCV: 85.9 fL (ref 80.0–100.0)
Platelets: 412 10*3/uL — ABNORMAL HIGH (ref 150–400)
RBC: 3.91 MIL/uL — ABNORMAL LOW (ref 4.22–5.81)
RDW: 13.7 % (ref 11.5–15.5)
WBC: 14.7 10*3/uL — ABNORMAL HIGH (ref 4.0–10.5)
nRBC: 0 % (ref 0.0–0.2)

## 2021-12-18 LAB — PHOSPHORUS: Phosphorus: 2 mg/dL — ABNORMAL LOW (ref 2.5–4.6)

## 2021-12-18 LAB — MAGNESIUM: Magnesium: 2.1 mg/dL (ref 1.7–2.4)

## 2021-12-18 MED ORDER — POTASSIUM PHOSPHATES 15 MMOLE/5ML IV SOLN
30.0000 mmol | Freq: Once | INTRAVENOUS | Status: AC
  Administered 2021-12-18: 30 mmol via INTRAVENOUS
  Filled 2021-12-18: qty 10

## 2021-12-18 MED ORDER — POTASSIUM CHLORIDE CRYS ER 20 MEQ PO TBCR
40.0000 meq | EXTENDED_RELEASE_TABLET | Freq: Once | ORAL | Status: AC
  Administered 2021-12-18: 40 meq via ORAL
  Filled 2021-12-18: qty 2

## 2021-12-18 MED ORDER — STERILE WATER FOR INJECTION IJ SOLN
INTRAMUSCULAR | Status: AC
  Filled 2021-12-18: qty 10

## 2021-12-18 NOTE — Plan of Care (Signed)

## 2021-12-18 NOTE — Progress Notes (Signed)
  PROGRESS NOTE    [name]  [id number] DOB: Mar 21, 1963 DOA: 12/17/2021 PCP: [facility] [place]  150A/150A-AA  LOS: 1 day   Brief hospital course:   Assessment & Plan: Lucas Burke is a 61 y.o. 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 daughter who called EMS.     Pt said he has been feeling weak, admitted to not eating much both due to lack of appetite and not having his dentures.  Of note, pt had a similar recent hospitalization for hypercalcemia and was discharged on 12/07/21 with Ca of 11.1.  * Hypercalcemia of malignancy --Calcium >15 on presentation, similar to last hospitalization.  Due to Stage IV lung cancer. --s/p zoledronic acid x1 f/b IM Calcitonin.  Ca improved to 11.9 the next day Plan: --cont IM calcitonin BID for 48 hours total --cont IVF @100  ml/hr   AKI --Cr 2.12 on presentation.  Baseline around 1.4.  Likely due to dehydration from hypercalcemia. --s/p 2L NS in the ED --improved with IVF Plan: --cont MIVF@100    Hypotension, chronic --cont home midodrine --cont MIVF@100    Weakness 2/2 Malignancy and poor nutrition --PT/OT --Ensure TID   Troponin elevation 2/2 demand ischemia --trop 800's and flat.  ED provider discussed with oncall cardiology, who deemed it demand ischemia.   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  Was lethargic and wasn't interactive.    Ca trending down.   Objective: Vitals:   12/17/21 2217 12/18/21 0346 12/18/21 0738 12/18/21 1510  BP: 95/62 104/66 92/69 111/78  Pulse: 72 78 94 81  Resp: 16 16 18 17   Temp:  98.3 F (36.8 C) 97.6 F (36.4 C) 98.6 F (37 C)  TempSrc:  Oral Oral   SpO2: 100% 98% 98% 100%  Weight:        Intake/Output Summary (Last 24 hours) at 12/18/2021 2005 Last data filed at 12/18/2021 1830 Gross per 24 hour  Intake 3256.44 ml  Output 675 ml  Net 2581.44 ml   Filed Weights   12/17/21 0832  Weight: 65 kg    Examination:   Constitutional: NAD, lethargic, cachetic HEENT: conjunctivae and lids normal, EOMI CV: No cyanosis.   RESP: normal respiratory effort, on RA SKIN: warm, dry   Data Reviewed: I have personally reviewed labs and imaging studies  Time spent: 50 minutes  Enzo Bi, MD [facility] If 7PM-7AM, please contact night-coverage 12/18/2021, 8:05 PM

## 2021-12-19 LAB — MAGNESIUM: Magnesium: 2.1 mg/dL (ref 1.7–2.4)

## 2021-12-19 LAB — BASIC METABOLIC PANEL
Anion gap: 5 (ref 5–15)
BUN: 20 mg/dL (ref 8–23)
CO2: 25 mmol/L (ref 22–32)
Calcium: 11.5 mg/dL — ABNORMAL HIGH (ref 8.9–10.3)
Chloride: 112 mmol/L — ABNORMAL HIGH (ref 98–111)
Creatinine, Ser: 1.42 mg/dL — ABNORMAL HIGH (ref 0.61–1.24)
GFR, Estimated: 56 mL/min — ABNORMAL LOW (ref >60)
Glucose, Bld: 96 mg/dL (ref 70–99)
Potassium: 3.3 mmol/L — ABNORMAL LOW (ref 3.5–5.1)
Sodium: 142 mmol/L (ref 135–145)

## 2021-12-19 LAB — CBC
HCT: 33.3 % — ABNORMAL LOW (ref 39.0–52.0)
Hemoglobin: 10.5 g/dL — ABNORMAL LOW (ref 13.0–17.0)
MCH: 27.3 pg (ref 26.0–34.0)
MCHC: 31.5 g/dL (ref 30.0–36.0)
MCV: 86.7 fL (ref 80.0–100.0)
Platelets: 441 10*3/uL — ABNORMAL HIGH (ref 150–400)
RBC: 3.84 MIL/uL — ABNORMAL LOW (ref 4.22–5.81)
RDW: 13.7 % (ref 11.5–15.5)
WBC: 15.3 10*3/uL — ABNORMAL HIGH (ref 4.0–10.5)
nRBC: 0 % (ref 0.0–0.2)

## 2021-12-19 LAB — PHOSPHORUS: Phosphorus: 2.3 mg/dL — ABNORMAL LOW (ref 2.5–4.6)

## 2021-12-19 MED ORDER — POTASSIUM CHLORIDE CRYS ER 20 MEQ PO TBCR
40.0000 meq | EXTENDED_RELEASE_TABLET | Freq: Every day | ORAL | Status: DC
  Administered 2021-12-19 – 2021-12-20 (×2): 40 meq via ORAL
  Filled 2021-12-19 (×2): qty 2

## 2021-12-19 MED ORDER — CALCITONIN (SALMON) 200 UNIT/ACT NA SOLN
1.0000 | Freq: Every day | NASAL | Status: DC
  Administered 2021-12-19 – 2021-12-20 (×2): 1 via NASAL
  Filled 2021-12-19: qty 3.7

## 2021-12-19 MED ORDER — FUROSEMIDE 10 MG/ML IJ SOLN
40.0000 mg | Freq: Once | INTRAMUSCULAR | Status: AC
  Administered 2021-12-19: 40 mg via INTRAVENOUS
  Filled 2021-12-19: qty 4

## 2021-12-19 MED ORDER — QUETIAPINE FUMARATE 25 MG PO TABS
25.0000 mg | ORAL_TABLET | Freq: Every day | ORAL | Status: DC
  Administered 2021-12-19 – 2021-12-20 (×2): 25 mg via ORAL
  Filled 2021-12-19 (×2): qty 1

## 2021-12-19 MED ORDER — POTASSIUM PHOSPHATES 15 MMOLE/5ML IV SOLN
30.0000 mmol | Freq: Once | INTRAVENOUS | Status: AC
  Administered 2021-12-19: 30 mmol via INTRAVENOUS
  Filled 2021-12-19: qty 10

## 2021-12-19 NOTE — Plan of Care (Signed)
  Problem: Respiratory: Goal: Ability to maintain adequate ventilation will improve 12/19/2021 0442 by Suzette Battiest, LPN Outcome: Not Progressing   Problem: Education: Goal: Knowledge of General Education information will improve Description: Including pain rating scale, medication(s)/side effects and non-pharmacologic comfort measures 12/19/2021 0441 by Suzette Battiest, LPN Outcome: Progressing   Problem: Health Behavior/Discharge Planning: Goal: Ability to manage health-related needs will improve 12/19/2021 0442 by Suzette Battiest, LPN Outcome: Not Progressing   Problem: Clinical Measurements: Goal: Ability to maintain clinical measurements within normal limits will improve 12/19/2021 0442 by Suzette Battiest, LPN Outcome: Not Progressing   Problem: Clinical Measurements: Goal: Will remain free from infection 12/19/2021 0441 by Suzette Battiest, LPN Outcome: Progressing   Problem: Clinical Measurements: Goal: Respiratory complications will improve 12/19/2021 0442 by Suzette Battiest, LPN Outcome: Not Progressing   Problem: Clinical Measurements: Goal: Cardiovascular complication will be avoided 12/19/2021 0441 by Suzette Battiest, LPN Outcome: Progressing   Problem: Activity: Goal: Risk for activity intolerance will decrease 12/19/2021 0441 by Suzette Battiest, LPN Outcome: Progressing   Problem: Coping: Goal: Level of anxiety will decrease 12/19/2021 0441 by Suzette Battiest, LPN Outcome: Progressing   Problem: Safety: Goal: Ability to remain free from injury will improve 12/19/2021 0442 by Suzette Battiest, LPN Outcome: Not Progressing   Problem: Skin Integrity: Goal: Risk for impaired skin integrity will decrease 12/19/2021 0442 by Suzette Battiest, LPN Outcome: Not Progressing

## 2021-12-19 NOTE — Progress Notes (Signed)
  PROGRESS NOTE    Lucas Burke  WCB:[id number] DOB: [birth year] DOA: 12/17/2021 PCP: [facility] [place]  150A/150A-AA  LOS: 2 days   Brief hospital course:   Assessment & Plan: Lucas Burke is a 61 y.o. 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 daughter who called EMS.     Pt said he has been feeling weak, admitted to not eating much both due to lack of appetite and not having his dentures.  Of note, pt had a similar recent hospitalization for hypercalcemia and was discharged on 12/07/21 with Ca of 11.1.  * Hypercalcemia of malignancy --Calcium >15 on presentation, similar to last hospitalization.  Due to Stage IV lung cancer. --s/p zoledronic acid x1 f/b IM Calcitonin x48 hours.  Ca improved to 11.9 the next day Plan: --start nasal calcitonin spray daily --cont IVF @100  ml/hr --IV lasix 40 x1   AKI --Cr 2.12 on presentation.  Baseline around 1.4.  Likely due to dehydration from hypercalcemia. --s/p 2L NS in the ED --improved with IVF Plan: --cont MIVF@100    Hypotension, chronic --cont home midodrine --cont MIVF@100    Weakness 2/2 Malignancy and poor nutrition --PT/OT --Ensure TID   Troponin elevation 2/2 demand ischemia --trop 800's and flat.  ED provider discussed with oncall cardiology, who deemed it demand ischemia.   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  Drinking Ensure but didn't eat his meals.   Objective: Vitals:   12/18/21 0346 12/18/21 0738 12/18/21 1510 12/19/21 0008  BP: 104/66 92/69 111/78 116/71  Pulse: 78 94 81 87  Resp: 16 18 17 20   Temp: 98.3 F (36.8 C) 97.6 F (36.4 C) 98.6 F (37 C) 98 F (36.7 C)  TempSrc: Oral Oral    SpO2: 98% 98% 100% 97%  Weight:        Intake/Output Summary (Last 24 hours) at 12/19/2021 1513 Last data filed at 12/19/2021 0438 Gross per 24 hour  Intake 1249.18 ml  Output --  Net 1249.18 ml   Filed Weights   12/17/21 0832  Weight: 65 kg    Examination:   Constitutional: NAD, lethargic but responsive, cachectic  HEENT: conjunctivae and lids normal, EOMI CV: No cyanosis.   RESP: normal respiratory effort, on RA SKIN: warm, dry Neuro: II - XII grossly intact.   Psych: depressed mood and affect.    Data Reviewed: I have personally reviewed labs and imaging studies  Time spent: 35 minutes  Enzo Bi, MD [facility] If 7PM-7AM, please contact night-coverage 12/19/2021, 3:13 PM

## 2021-12-19 NOTE — Plan of Care (Signed)

## 2021-12-20 DIAGNOSIS — I959 Hypotension, unspecified: Secondary | ICD-10-CM

## 2021-12-20 DIAGNOSIS — Z515 Encounter for palliative care: Secondary | ICD-10-CM

## 2021-12-20 DIAGNOSIS — Z66 Do not resuscitate: Secondary | ICD-10-CM

## 2021-12-20 DIAGNOSIS — Z7189 Other specified counseling: Secondary | ICD-10-CM

## 2021-12-20 DIAGNOSIS — C3490 Malignant neoplasm of unspecified part of unspecified bronchus or lung: Secondary | ICD-10-CM

## 2021-12-20 DIAGNOSIS — N179 Acute kidney failure, unspecified: Secondary | ICD-10-CM

## 2021-12-20 DIAGNOSIS — R531 Weakness: Secondary | ICD-10-CM

## 2021-12-20 LAB — BASIC METABOLIC PANEL
Anion gap: 6 (ref 5–15)
BUN: 21 mg/dL (ref 8–23)
CO2: 24 mmol/L (ref 22–32)
Calcium: 10.9 mg/dL — ABNORMAL HIGH (ref 8.9–10.3)
Chloride: 113 mmol/L — ABNORMAL HIGH (ref 98–111)
Creatinine, Ser: 1.36 mg/dL — ABNORMAL HIGH (ref 0.61–1.24)
GFR, Estimated: 59 mL/min — ABNORMAL LOW (ref >60)
Glucose, Bld: 86 mg/dL (ref 70–99)
Potassium: 3.3 mmol/L — ABNORMAL LOW (ref 3.5–5.1)
Sodium: 143 mmol/L (ref 135–145)

## 2021-12-20 LAB — CBC
HCT: 30.6 % — ABNORMAL LOW (ref 39.0–52.0)
Hemoglobin: 9.4 g/dL — ABNORMAL LOW (ref 13.0–17.0)
MCH: 26.6 pg (ref 26.0–34.0)
MCHC: 30.7 g/dL (ref 30.0–36.0)
MCV: 86.7 fL (ref 80.0–100.0)
Platelets: 358 10*3/uL (ref 150–400)
RBC: 3.53 MIL/uL — ABNORMAL LOW (ref 4.22–5.81)
RDW: 14 % (ref 11.5–15.5)
WBC: 14 10*3/uL — ABNORMAL HIGH (ref 4.0–10.5)
nRBC: 0 % (ref 0.0–0.2)

## 2021-12-20 LAB — MAGNESIUM: Magnesium: 1.9 mg/dL (ref 1.7–2.4)

## 2021-12-20 LAB — PHOSPHORUS: Phosphorus: 2.4 mg/dL — ABNORMAL LOW (ref 2.5–4.6)

## 2021-12-20 MED ORDER — HALOPERIDOL 0.5 MG PO TABS: 0.5000 mg | ORAL_TABLET | ORAL | Status: DC | PRN

## 2021-12-20 MED ORDER — FUROSEMIDE 10 MG/ML IJ SOLN
40.0000 mg | Freq: Once | INTRAMUSCULAR | Status: AC
  Administered 2021-12-20: 40 mg via INTRAVENOUS
  Filled 2021-12-20: qty 4

## 2021-12-20 MED ORDER — POLYVINYL ALCOHOL 1.4 % OP SOLN: 1.0000 [drp] | Freq: Four times a day (QID) | OPHTHALMIC | Status: DC | PRN

## 2021-12-20 MED ORDER — HALOPERIDOL LACTATE 2 MG/ML PO CONC: 0.5000 mg | ORAL | Status: DC | PRN

## 2021-12-20 MED ORDER — HALOPERIDOL LACTATE 5 MG/ML IJ SOLN: 0.5000 mg | INTRAMUSCULAR | Status: DC | PRN

## 2021-12-20 MED ORDER — GLYCOPYRROLATE 0.2 MG/ML IJ SOLN: 0.2000 mg | INTRAMUSCULAR | Status: DC | PRN

## 2021-12-20 MED ORDER — GLYCOPYRROLATE 1 MG PO TABS
1.0000 mg | ORAL_TABLET | ORAL | Status: DC | PRN
  Administered 2021-12-20: 1 mg via ORAL
  Filled 2021-12-20 (×2): qty 1

## 2021-12-20 MED ORDER — SODIUM CHLORIDE 0.9 % IV SOLN: INTRAVENOUS | Status: DC

## 2021-12-20 MED ORDER — POTASSIUM PHOSPHATES 15 MMOLE/5ML IV SOLN
30.0000 mmol | Freq: Once | INTRAVENOUS | Status: AC
  Administered 2021-12-20: 30 mmol via INTRAVENOUS
  Filled 2021-12-20: qty 10

## 2021-12-20 MED ORDER — GUAIFENESIN-DM 100-10 MG/5ML PO SYRP
5.0000 mL | ORAL_SOLUTION | ORAL | Status: DC | PRN
  Administered 2021-12-20: 5 mL via ORAL
  Filled 2021-12-20: qty 10

## 2021-12-20 MED ORDER — BIOTENE DRY MOUTH MT LIQD: 15.0000 mL | OROMUCOSAL | Status: DC | PRN

## 2021-12-20 NOTE — Progress Notes (Signed)
Manufacturing engineer [facility]) Hospital Liaison Note  Received request from Transitions of Care Manager [place] for family interest in [place]. Visited patient at bedside and spoke with patient & daughter/[name] to confirm interest and explain services.  Approval for Hospice Home is determined by [facility] MD. Once [facility] MD has determined Hospice Home eligibility, [place] will update hospital staff and family. Patient is approved.  Please do not hesitate to call with any hospice related questions.    Thank you for the opportunity to participate in this patient's care.  Daphene Calamity, MSW [facility] Liaison  [phone]

## 2021-12-20 NOTE — Plan of Care (Signed)

## 2021-12-20 NOTE — Consult Note (Signed)
Consultation Note Date: 12/20/2021   Patient Name: Lucas Burke  DOB: September 26, 1960  MRN: [id number]  Age / Sex: 61 y.o., male  PCP: [facility] [place] Referring Physician: Enzo Bi, MD  Reason for Consultation: Establishing goals of care  HPI/Patient Profile: 61 y.o. 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 intake after being found down by 72 year old daughter.  Patient being treated for hypercalcemia of malignancy, AKI, and hypokalemia with hypophosphatemia.  Of note, patient was hospitalized for hypercalcemia and discharged on 12/07/2021 with CEA of 11.1.  As of today, CEA is 10.9.   Given the patient's poor functional status and poor p.o. intake, PMT was consulted to discuss goals of care.  Clinical Assessment and Goals of Care: I have reviewed medical records including EPIC notes, labs and imaging, assessed the patient and then met with patient, his daughter [name], and her friend at bedside to discuss diagnosis prognosis, [place], EOL wishes, disposition and options.  Patient is resting in no apparent distress during our discussion.  He is easily arousable, alert and oriented x4 and able to participate in decision-making.  However, patient's daughter is provider at the majority of information during our discussion.  I introduced Palliative Medicine as specialized medical care for people living with serious illness. It focuses on providing relief from the symptoms and stress of a serious illness. The goal is to improve quality of life for both the patient and the family.  We discussed a brief life review of the patient.  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  He has 7 children.  He is primary caregiver of 3 adolescence in his home (now with CPS as their mother is incarcerated d/t drug abuse).  [name] shares he is "always a fun time" and recalls he had a huge afro years ago and has always enjoyed a variety of music - from [place] to [place].  As far as functional and nutritional status [name] notes a significant decline in patient over the last several weeks. She she shares he has not taken his medications correctly and has been unable to eat anything more than a few bites of applesauce over the last week and a half. She also states that he would c/o pain consistently but over the last week has become more quiet and has no c/o pain.   We discussed patient's current illness and what it means in the larger context of patient's on-going co-morbidities.I attempted to elicit values and goals of care important to the patient. Patient verbalized he is "tired" and "worn out". He shares he would like to go home but knows it is not a good option for him anymore. He is worried about his last day and wants to make sure he does not have a painful passing. We discussed aggressive medical management to  relieve suffering at EOL. Patient shares it "sounds good, or as good as it can sound" and that he wants to do "what [name] wants". Therapeutic silence, active listening, and emotional support given.    Advance directives, concepts specific to code status, artificial feeding and hydration, and rehospitalization were considered and discussed.  When asked in the event that he is unable to speak for himself who would he want his decision maker to be.  Patient clearly stated his daughter Kief.  Daughter shares she has POA paperwork and plans to send it electronically today.  CODE STATUS reviewed.  DNR and allowing a natural death discussed.  Patient and daughter in agreement to shift CODE STATUS from full to DNR.  The difference between aggressive medical  intervention and comfort care was considered in light of patient's goals of care.  Hospice inpatient unit and hospice philosophy discussed.  Patient in agreement to be evaluated for hospice inpatient unit. Pt to shift to comfort measures to include discontinuing procedures, labs, or tests not focused strictly on comfort. Medications focused on relieving suffering and IVF remain. Family and patient in agreement.    Questions and concerns were addressed. The family was encouraged to call with questions or concerns.  Goals clear - evaluated for hospice inpatient unit, comfort measures while hospitalized, DNR, and dtr [name] is NOK/surrogate decision maker if needed.   Attending Dr. Billie Ruddy, RN, and [facility] made aware of family's wishes to pursue hospice inpatient unit.   Adjustments made in [facility] to reflect DNR and comfort measures. Many PO medications remain (midodrine, gabapentin) and are appropriate to continue to give unless patient refuses or is unable to tolerate PO.   PMT will shadow patient's chart and continue to monitor the patient throughout his hospitalization. PMT will re-engage at patient/family's request, if goals change, or if patient's health deteriorates during hospitalization.   Primary Decision Maker PATIENT  Code Status/Advance Care Planning: DNR  Prognosis:   < 2 weeks  Discharge Planning: Hospice facility  Primary Diagnoses: Present on Admission:  Hypercalcemia   Physical Exam Vitals reviewed.  Constitutional:      Comments: Frail, thin  HENT:     Head: Normocephalic.     Mouth/Throat:     Mouth: Mucous membranes are moist.  Eyes:     Pupils: Pupils are equal, round, and reactive to light.  Cardiovascular:     Rate and Rhythm: Normal rate.     Pulses: Normal pulses.  Pulmonary:     Effort: Pulmonary effort is normal.  Abdominal:     Palpations: Abdomen is soft.  Musculoskeletal:     Comments: Generalized weakness  Skin:    General: Skin is warm and dry.   Neurological:     Mental Status: He is alert and oriented to person, place, and time.  Psychiatric:        Mood and Affect: Mood normal.        Behavior: Behavior normal.        Thought Content: Thought content normal.        Judgment: Judgment normal.     Palliative Assessment/Data: 20% d/t almost no PO intake     Thank you for this consult. Palliative medicine will continue to follow and assist holistically.   Time Total: 90 minutes Greater than 50%  of this time was spent counseling and coordinating care related to the above assessment and plan.  Signed by: Jordan Hawks, DNP, FNP-BC Palliative Medicine    Please contact Palliative Medicine Team  phone at [phone] for questions and concerns.  For individual provider: See Shea Evans

## 2021-12-20 NOTE — Progress Notes (Signed)
  PROGRESS NOTE    Lucas Burke  URK:[id number] DOB: 11/21/1960 DOA: 12/17/2021 PCP: [facility] [place]  150A/150A-AA  LOS: 3 days   Brief hospital course:   Assessment & Plan: Lucas Burke is a 61 y.o. 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 daughter who called EMS.     Lucas Burke said he has been feeling weak, admitted to not eating much both due to lack of appetite and not having his dentures.  Of note, Lucas Burke had a similar recent hospitalization for hypercalcemia and was discharged on 12/07/21 with Ca of 11.1.  * Hypercalcemia of malignancy --Calcium >15 on presentation, similar to last hospitalization.  Due to Stage IV lung cancer. --s/p zoledronic acid x1 f/b IM Calcitonin x48 hours.  Ca improved to 11.9 the next day Plan: --cont nasal calcitonin spray daily --cont MIVF@50  --IV lasix 40 x1   AKI --Cr 2.12 on presentation.  Baseline around 1.4.  Likely due to dehydration from hypercalcemia. --s/p 2L NS in the ED --improved with IVF Plan: --cont MIVF@50    Hypotension, chronic --cont home midodrine --cont MIVF@50    Weakness 2/2 Malignancy and poor nutrition --Lucas Burke/OT --Ensure TID   Troponin elevation 2/2 demand ischemia --trop 800's and flat.  ED provider discussed with oncall cardiology, who deemed it demand ischemia.   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, left [facility]) --outpatient f/u with primary oncologist at [facility] --palliative care consult today, decision made for hospice facility referral  Hypokalemia Hypophos --2/2 poor nutrition    DVT prophylaxis: Lovenox SQ Code Status: Full code  Family Communication:  Level of care: Med-Surg Dispo:   The  patient is from: home Anticipated d/c is to: hospice facility Anticipated d/c date is: whenever bed available   Subjective and Interval History:  Palliative care provider discussed with Lucas Burke and daughter (POA), and decision made for hospice facility referral.   Objective: Vitals:   12/19/21 1935 12/19/21 2141 12/20/21 0221 12/20/21 0721  BP: 90/69 98/66 92/64  96/67  Pulse: 78 79 86 87  Resp: 18  17   Temp: 98.9 F (37.2 C)   98.1 F (36.7 C)  TempSrc: Oral   Oral  SpO2: 97%  98% 98%  Weight:        Intake/Output Summary (Last 24 hours) at 12/20/2021 1957 Last data filed at 12/20/2021 1522 Gross per 24 hour  Intake 980.69 ml  Output 600 ml  Net 380.69 ml   Filed Weights   12/17/21 0832  Weight: 65 kg    Examination:   Constitutional: NAD, alert, oriented, cachetic HEENT: conjunctivae and lids normal, EOMI CV: No cyanosis.   RESP: normal respiratory effort, on RA Extremities: No effusions, edema in BLE SKIN: warm, dry Neuro: II - XII grossly intact.   Psych: depressed mood and affect.     Data Reviewed: I have personally reviewed labs and imaging studies  Time spent: 35 minutes  Enzo Bi, MD [facility] If 7PM-7AM, please contact night-coverage 12/20/2021, 7:57 PM

## 2021-12-20 NOTE — Progress Notes (Signed)
Nutrition Brief Note  Chart reviewed. Pt now transitioning to comfort care. Plan to transition to residential hospice.  No further nutrition interventions planned at this time.  Please re-consult as needed.   Loistine Chance, RD, LDN, [place] Registered Dietitian II Certified Diabetes Care and Education Specialist Please refer to [facility] for RD and/or RD on-call/weekend/after hours pager

## 2021-12-21 MED ORDER — MORPHINE SULFATE (PF) 2 MG/ML IV SOLN
1.0000 mg | Freq: Once | INTRAVENOUS | Status: AC
  Administered 2021-12-21: 1 mg via INTRAVENOUS
  Filled 2021-12-21: qty 1

## 2021-12-21 NOTE — Progress Notes (Signed)
[facility])   Consent forms have been completed.  EMS notified of patient D/C and transport arranged. TOC/Deliliah and Attending Physician/Dr. Billie Ruddy also notified of transport arrangement.    Please send signed DNR form with patient and RN call report to [phone].    Daphene Calamity, MSW [facility] Liaison [phone]

## 2021-12-21 NOTE — Discharge Summary (Signed)
Physician Discharge Summary   Lucas Burke  male DOB: [birth year]  TIR:[id number]  PCP: [facility] [place] date: 12/17/2021 Discharge date: 12/21/2021  Admitted From: home Disposition:  hospice facility CODE STATUS: DNR   Hospital Course:  For full details, please see H&P, progress notes, consult notes and ancillary notes.  Briefly,  Lucas Burke is a 61 y.o. 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 daughter who called EMS.     Pt said he has been feeling weak, admitted to not eating much both due to lack of appetite and not having his dentures.  Of note, pt had a similar recent hospitalization for hypercalcemia and was discharged on 12/07/21 with Ca of 11.1.   Comfort care and hospice status --Per POA daughter, pt has been declining and stopped doing anything for himself, including going to his oncology appointment.  Daughter requested hospice involvement.  After palliative care consult, discussion with pt and family, pt was made comfort care and accepted by hospice facility.  * Hypercalcemia of malignancy --Calcium >15 on presentation, similar to last hospitalization.  Due to Stage IV lung cancer. --s/p zoledronic acid x1 f/b IM Calcitonin x48 hours, along with IVF.  Ca improved to 11.9 the next day --was not discharged on nasal calcitonin spray since pt decided to forgo treatment and was comfort care.   AKI --Cr 2.12 on presentation.  Baseline around 1.4.  Likely due to dehydration from hypercalcemia.  Received IVF.   Hypotension, chronic --home midodrine not continued since comfort care status   Weakness 2/2 Malignancy and poor nutrition --comfort care now   Troponin elevation 2/2 demand ischemia --trop 800's and flat.  ED provider discussed with oncall cardiology, who deemed it  demand ischemia.   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 The score is based on dignosis, age, lab data, medications, orders, and past utilization.   Low:  0-14.9   Medium: 15-21.9   High: 22-29.9   Extreme: 30 and above         Discharge Instructions:  Allergies as of 12/21/2021       Reactions   Codeine Nausea Only        Medication List     STOP taking these medications    acetaminophen 325 MG tablet Commonly known as: TYLENOL   alum & mag hydroxide-simeth 676-195-09 MG/5ML suspension Commonly known as: MAALOX/MYLANTA   dronabinol 5 MG capsule Commonly known as: MARINOL   feeding supplement Liqd   midodrine 5 MG tablet Commonly known as: PROAMATINE   mirtazapine 15 MG disintegrating tablet Commonly known as: REMERON SOL-TAB   multivitamin with minerals Tabs tablet   polyethylene glycol 17 g packet Commonly  known as: MiraLax       TAKE these medications    ALPRAZolam 0.25 MG tablet Commonly known as: XANAX Take 1 tablet (0.25 mg total) by mouth 3 (three) times daily as needed for anxiety.   gabapentin 300 MG capsule Commonly known as: NEURONTIN TAKE 1 CAPSULE(300 MG) BY MOUTH AT BEDTIME   ondansetron 4 MG tablet Commonly known as: ZOFRAN Take 1 tablet (4 mg total) by mouth every 6 (six) hours  as needed for nausea.   oxyCODONE 5 MG immediate release tablet Commonly known as: Oxy IR/ROXICODONE Take 1 tablet (5 mg total) by mouth every 4 (four) hours as needed for severe pain.          Allergies  Allergen Reactions   Codeine Nausea Only     The results of significant diagnostics from this hospitalization (including imaging, microbiology, ancillary and laboratory) are listed below for reference.   Consultations:   Procedures/Studies: DG Chest Portable 1 View  Result Date: 12/17/2021 CLINICAL DATA:  Provided history: Shortness of breath. Patient found down, presumed unwitnessed fall, stage IV lung cancer. EXAM: PORTABLE CHEST 1 VIEW COMPARISON:  Prior chest radiographs 12/06/2021 and earlier. FINDINGS: Complete opacification of the left hemithorax, unchanged from the prior chest radiograph of 12/06/2021. The cardiac margins are largely obscured, precluding evaluation of heart size. No appreciable airspace consolidation on the right. No evidence of pneumothorax. Dextrocurvature in spondylosis of the thoracic spine. IMPRESSION: Complete opacification of the left hemithorax, unchanged from the prior chest radiograph of 12/06/2021. Electronically Signed   By: Kellie Simmering D.O.   On: 12/17/2021 09:31   CT HEAD WO CONTRAST (5MM)  Result Date: 12/17/2021 CLINICAL DATA:  Provided history: Head trauma, abnormal mental status. Facial trauma, blunt. Ataxia, cervical trauma. Additional history provided: Patient found down, presumed unwitnessed fall, history of stage IV lung cancer. EXAM: CT HEAD WITHOUT CONTRAST CT MAXILLOFACIAL WITHOUT CONTRAST CT CERVICAL SPINE WITHOUT CONTRAST TECHNIQUE: Multidetector CT imaging of the head, cervical spine, and maxillofacial structures were performed using the standard protocol without intravenous contrast. Multiplanar CT image reconstructions of the cervical spine and maxillofacial structures were also generated. RADIATION DOSE REDUCTION: This exam was  performed according to the departmental dose-optimization program which includes automated exposure control, adjustment of the mA and/or kV according to patient size and/or use of iterative reconstruction technique. COMPARISON:  Head CT 12/03/2021. Cervical spine CT 01/30/2019. Prior chest radiograph 12/06/2021. FINDINGS: CT HEAD FINDINGS Brain: Mild generalized parenchymal atrophy. There is no acute intracranial hemorrhage. No demarcated cortical infarct. No extra-axial fluid collection. No evidence of an intracranial mass. No midline shift. Vascular: No hyperdense vessel. Atherosclerotic calcifications. Skull: No fracture or aggressive osseous lesion. CT MAXILLOFACIAL FINDINGS Osseous: No acute maxillofacial fracture is identified. Orbits: No acute orbital finding. Sinuses: No significant paranasal sinus disease. Soft tissues: No maxillofacial hematoma appreciable by CT. CT CERVICAL SPINE FINDINGS Alignment: Slight grade 1 anterolisthesis at C2-C3. 2 mm grade 1 anterolisthesis at C3-C4. 3 mm grade 1 anterolisthesis at C7-T1. Skull base and vertebrae: The basion-dental and atlanto-dental intervals are maintained.No evidence of acute fracture to the cervical spine. Soft tissues and spinal canal: No prevertebral fluid or swelling. No visible canal hematoma. Disc levels: Cervical spondylosis with multilevel disc space narrowing, disc bulges, posterior disc osteophyte complexes, uncovertebral hypertrophy and facet arthrosis. Disc space narrowing is greatest at C5-C6, C6-C7 and T1-T2 (advanced at these levels). No appreciable high-grade spinal canal stenosis. Multilevel bony neural foraminal narrowing. Multilevel ventral osteophytes. Upper chest: Complete opacification of the partially  imaged left lung apex. Emphysema. No airspace consolidation within the imaged right lung apex. No visible pneumothorac. IMPRESSION: CT head: 1. No evidence of acute intracranial abnormality. 2. Mild generalized parenchymal atrophy. CT  maxillofacial: No evidence of acute maxillofacial fracture. CT cervical spine: 1. No evidence of acute fracture to the cervical spine. 2. Cervical spondylosis, as described. 3. Grade 1 anterolisthesis at C2-C3, C3-C4 and C7-T1. 4. Complete opacification of the imaged left lung apex in this patient with a reported history of stage IV lung cancer. Correlate with findings on same day chest radiograph. Electronically Signed   By: Kellie Simmering D.O.   On: 12/17/2021 09:26   CT Cervical Spine Wo Contrast  Result Date: 12/17/2021 CLINICAL DATA:  Provided history: Head trauma, abnormal mental status. Facial trauma, blunt. Ataxia, cervical trauma. Additional history provided: Patient found down, presumed unwitnessed fall, history of stage IV lung cancer. EXAM: CT HEAD WITHOUT CONTRAST CT MAXILLOFACIAL WITHOUT CONTRAST CT CERVICAL SPINE WITHOUT CONTRAST TECHNIQUE: Multidetector CT imaging of the head, cervical spine, and maxillofacial structures were performed using the standard protocol without intravenous contrast. Multiplanar CT image reconstructions of the cervical spine and maxillofacial structures were also generated. RADIATION DOSE REDUCTION: This exam was performed according to the departmental dose-optimization program which includes automated exposure control, adjustment of the mA and/or kV according to patient size and/or use of iterative reconstruction technique. COMPARISON:  Head CT 12/03/2021. Cervical spine CT 01/30/2019. Prior chest radiograph 12/06/2021. FINDINGS: CT HEAD FINDINGS Brain: Mild generalized parenchymal atrophy. There is no acute intracranial hemorrhage. No demarcated cortical infarct. No extra-axial fluid collection. No evidence of an intracranial mass. No midline shift. Vascular: No hyperdense vessel. Atherosclerotic calcifications. Skull: No fracture or aggressive osseous lesion. CT MAXILLOFACIAL FINDINGS Osseous: No acute maxillofacial fracture is identified. Orbits: No acute orbital  finding. Sinuses: No significant paranasal sinus disease. Soft tissues: No maxillofacial hematoma appreciable by CT. CT CERVICAL SPINE FINDINGS Alignment: Slight grade 1 anterolisthesis at C2-C3. 2 mm grade 1 anterolisthesis at C3-C4. 3 mm grade 1 anterolisthesis at C7-T1. Skull base and vertebrae: The basion-dental and atlanto-dental intervals are maintained.No evidence of acute fracture to the cervical spine. Soft tissues and spinal canal: No prevertebral fluid or swelling. No visible canal hematoma. Disc levels: Cervical spondylosis with multilevel disc space narrowing, disc bulges, posterior disc osteophyte complexes, uncovertebral hypertrophy and facet arthrosis. Disc space narrowing is greatest at C5-C6, C6-C7 and T1-T2 (advanced at these levels). No appreciable high-grade spinal canal stenosis. Multilevel bony neural foraminal narrowing. Multilevel ventral osteophytes. Upper chest: Complete opacification of the partially imaged left lung apex. Emphysema. No airspace consolidation within the imaged right lung apex. No visible pneumothorac. IMPRESSION: CT head: 1. No evidence of acute intracranial abnormality. 2. Mild generalized parenchymal atrophy. CT maxillofacial: No evidence of acute maxillofacial fracture. CT cervical spine: 1. No evidence of acute fracture to the cervical spine. 2. Cervical spondylosis, as described. 3. Grade 1 anterolisthesis at C2-C3, C3-C4 and C7-T1. 4. Complete opacification of the imaged left lung apex in this patient with a reported history of stage IV lung cancer. Correlate with findings on same day chest radiograph. Electronically Signed   By: Kellie Simmering D.O.   On: 12/17/2021 09:26   CT Maxillofacial Wo Contrast  Result Date: 12/17/2021 CLINICAL DATA:  Provided history: Head trauma, abnormal mental status. Facial trauma, blunt. Ataxia, cervical trauma. Additional history provided: Patient found down, presumed unwitnessed fall, history of stage IV lung cancer. EXAM: CT HEAD  WITHOUT CONTRAST CT MAXILLOFACIAL WITHOUT CONTRAST CT CERVICAL  SPINE WITHOUT CONTRAST TECHNIQUE: Multidetector CT imaging of the head, cervical spine, and maxillofacial structures were performed using the standard protocol without intravenous contrast. Multiplanar CT image reconstructions of the cervical spine and maxillofacial structures were also generated. RADIATION DOSE REDUCTION: This exam was performed according to the departmental dose-optimization program which includes automated exposure control, adjustment of the mA and/or kV according to patient size and/or use of iterative reconstruction technique. COMPARISON:  Head CT 12/03/2021. Cervical spine CT 01/30/2019. Prior chest radiograph 12/06/2021. FINDINGS: CT HEAD FINDINGS Brain: Mild generalized parenchymal atrophy. There is no acute intracranial hemorrhage. No demarcated cortical infarct. No extra-axial fluid collection. No evidence of an intracranial mass. No midline shift. Vascular: No hyperdense vessel. Atherosclerotic calcifications. Skull: No fracture or aggressive osseous lesion. CT MAXILLOFACIAL FINDINGS Osseous: No acute maxillofacial fracture is identified. Orbits: No acute orbital finding. Sinuses: No significant paranasal sinus disease. Soft tissues: No maxillofacial hematoma appreciable by CT. CT CERVICAL SPINE FINDINGS Alignment: Slight grade 1 anterolisthesis at C2-C3. 2 mm grade 1 anterolisthesis at C3-C4. 3 mm grade 1 anterolisthesis at C7-T1. Skull base and vertebrae: The basion-dental and atlanto-dental intervals are maintained.No evidence of acute fracture to the cervical spine. Soft tissues and spinal canal: No prevertebral fluid or swelling. No visible canal hematoma. Disc levels: Cervical spondylosis with multilevel disc space narrowing, disc bulges, posterior disc osteophyte complexes, uncovertebral hypertrophy and facet arthrosis. Disc space narrowing is greatest at C5-C6, C6-C7 and T1-T2 (advanced at these levels). No appreciable  high-grade spinal canal stenosis. Multilevel bony neural foraminal narrowing. Multilevel ventral osteophytes. Upper chest: Complete opacification of the partially imaged left lung apex. Emphysema. No airspace consolidation within the imaged right lung apex. No visible pneumothorac. IMPRESSION: CT head: 1. No evidence of acute intracranial abnormality. 2. Mild generalized parenchymal atrophy. CT maxillofacial: No evidence of acute maxillofacial fracture. CT cervical spine: 1. No evidence of acute fracture to the cervical spine. 2. Cervical spondylosis, as described. 3. Grade 1 anterolisthesis at C2-C3, C3-C4 and C7-T1. 4. Complete opacification of the imaged left lung apex in this patient with a reported history of stage IV lung cancer. Correlate with findings on same day chest radiograph. Electronically Signed   By: Kellie Simmering D.O.   On: 12/17/2021 09:26   DG Chest Port 1 View  Result Date: 12/06/2021 CLINICAL DATA:  Dyspnea. EXAM: PORTABLE CHEST 1 VIEW COMPARISON:  March 04, 2022 FINDINGS: There is complete opacification of left hemithorax. Subsequent left-sided volume loss is seen with right to left shift of the mediastinal structures. The heart size and mediastinal contours are subsequently limited in evaluation. The right lung is clear. A chronic fracture deformity is seen involving the distal right clavicle. IMPRESSION: Stable complete opacification of left hemithorax, without acute or active disease seen within the right lung. Electronically Signed   By: Virgina Norfolk M.D.   On: 12/06/2021 21:15   [place] CHEST (PLEURAL EFFUSION)  Result Date: 12/04/2021 CLINICAL DATA:  Evaluate for left pleural effusion. EXAM: CHEST ULTRASOUND COMPARISON:  CT angio chest 12/04/2021. FINDINGS: There is a trace amount of simple fluid identified within the left base. The remaining portions of the imaged left hemithorax appear filled with complex fluid and debris. IMPRESSION: 1. Complex left pleural fluid collection  compatible with malignant pleural effusion. Only a trace amount of simple appearing fluid noted at this time. Electronically Signed   By: Kerby Moors M.D.   On: 12/04/2021 10:38   CT Angio Chest PE W and/or Wo Contrast  Result Date: 12/04/2021 CLINICAL DATA:  Hypoxia.  Stage IV lung cancer. EXAM: CT ANGIOGRAPHY CHEST WITH CONTRAST TECHNIQUE: Multidetector CT imaging of the chest was performed using the standard protocol during bolus administration of intravenous contrast. Multiplanar CT image reconstructions and MIPs were obtained to evaluate the vascular anatomy. RADIATION DOSE REDUCTION: This exam was performed according to the departmental dose-optimization program which includes automated exposure control, adjustment of the mA and/or kV according to patient size and/or use of iterative reconstruction technique. CONTRAST:  27mL OMNIPAQUE IOHEXOL 350 MG/ML SOLN COMPARISON:  05/27/2021 FINDINGS: Cardiovascular: The heart is anteriorly displaced by extensive tumor in the left chest. There is a new masslike filling defect arising from the post wall of the left atrium measuring 3.7 cm. No pulmonary artery filling defect. Shunting versus malignant narrowing of left-sided pulmonary arteries. Mediastinum/Nodes: Confluent adenopathy in the subcarinal mediastinum, individual nodes are no longer seen. Lungs/Pleura: Extensive tumor in the left lower chest with extensive cavitary features at the lower lobe. Loculated pleural fluid at the left apex. The left upper lobe is completely collapsed with cut off at the left mainstem bronchus where there is tumor. Upper Abdomen: No acute finding. Hepatic venous reflux. Right lobe and infrahepatic caval peripheral contrast is likely from peripheral ization of contrast rather than thrombus. Musculoskeletal: No acute finding.  No bony erosion. Review of the MIP images confirms the above findings. IMPRESSION: 1. Known extensive malignancy in the left chest with mediastinal  involvement and left mainstem occlusion. Prominent tumor invasion is now seen in the left atrium, narrowing the chamber. 2. Negative for pulmonary embolism. Electronically Signed   By: Jorje Guild M.D.   On: 12/04/2021 05:22   CT Head Wo Contrast  Result Date: 12/03/2021 CLINICAL DATA:  Lightheadedness. EXAM: CT HEAD WITHOUT CONTRAST TECHNIQUE: Contiguous axial images were obtained from the base of the skull through the vertex without intravenous contrast. RADIATION DOSE REDUCTION: This exam was performed according to the departmental dose-optimization program which includes automated exposure control, adjustment of the mA and/or kV according to patient size and/or use of iterative reconstruction technique. COMPARISON:  June 05, 2021 FINDINGS: Brain: There is mild cerebral atrophy with widening of the extra-axial spaces and ventricular dilatation. There are areas of decreased attenuation within the white matter tracts of the supratentorial brain, consistent with microvascular disease changes. Vascular: No hyperdense vessel or unexpected calcification. Skull: Normal. Negative for fracture or focal lesion. Sinuses/Orbits: No acute finding. Other: None. IMPRESSION: 1. No acute intracranial abnormality. 2. Generalized cerebral atrophy and microvascular disease changes of the supratentorial brain. Electronically Signed   By: Virgina Norfolk M.D.   On: 12/03/2021 23:52   DG Chest Portable 1 View  Result Date: 12/03/2021 CLINICAL DATA:  Hypoxia EXAM: PORTABLE CHEST 1 VIEW COMPARISON:  Radiograph 06/07/2021 FINDINGS: Similar complete opacification of the left hemithorax with volume loss and deviation of the trachea to the left. Pulmonary vascular congestion in the right lung. No consolidation, pleural effusion, or pneumothorax on the right. The cardiac silhouette is obscured. No acute osseous abnormality. IMPRESSION: 1. Right-sided pulmonary vascular congestion. 2. Unchanged complete opacification of the left  hemithorax. Electronically Signed   By: Placido Sou M.D.   On: 12/03/2021 22:58      Labs: BNP (last 3 results) Recent Labs    06/02/21 0436 06/03/21 0328 06/04/21 0421  BNP 513.7* 450.0* 578.4*   Basic Metabolic Panel: Recent Labs  Lab 12/17/21 0841 12/17/21 2311 12/18/21 0549 12/19/21 0457 12/20/21 0539  NA 140  --  141 142 143  K 3.3*  --  3.0* 3.3* 3.3*  CL 104  --  111 112* 113*  CO2 28  --  24 25 24   GLUCOSE 108*  --  98 96 86  BUN 25*  --  23 20 21   CREATININE 2.12*  --  1.46* 1.42* 1.36*  CALCIUM >15.0* 12.2* 11.9* 11.5* 10.9*  MG 2.5*  --  2.1 2.1 1.9  PHOS  --   --  2.0* 2.3* 2.4*   Liver Function Tests: Recent Labs  Lab 12/17/21 0841  AST 53*  ALT 18  ALKPHOS 96  BILITOT 0.6  PROT 8.0  ALBUMIN 2.7*   No results for input(s): "LIPASE", "AMYLASE" in the last 168 hours. Recent Labs  Lab 12/17/21 0939  AMMONIA 14   CBC: Recent Labs  Lab 12/17/21 0841 12/18/21 0549 12/19/21 0457 12/20/21 0539  WBC 19.1* 14.7* 15.3* 14.0*  NEUTROABS 16.4*  --   --   --   HGB 12.6* 10.2* 10.5* 9.4*  HCT 41.7 33.6* 33.3* 30.6*  MCV 88.0 85.9 86.7 86.7  PLT 471* 412* 441* 358   Cardiac Enzymes: Recent Labs  Lab 12/17/21 0841  CKTOTAL 775*   BNP: Invalid input(s): "POCBNP" CBG: No results for input(s): "GLUCAP" in the last 168 hours. D-Dimer No results for input(s): "DDIMER" in the last 72 hours. Hgb A1c No results for input(s): "HGBA1C" in the last 72 hours. Lipid Profile No results for input(s): "CHOL", "HDL", "LDLCALC", "TRIG", "CHOLHDL", "LDLDIRECT" in the last 72 hours. Thyroid function studies No results for input(s): "TSH", "T4TOTAL", "T3FREE", "THYROIDAB" in the last 72 hours.  Invalid input(s): "FREET3" Anemia work up No results for input(s): "VITAMINB12", "FOLATE", "FERRITIN", "TIBC", "IRON", "RETICCTPCT" in the last 72 hours. Urinalysis    Component Value Date/Time   COLORURINE YELLOW (A) 12/17/2021 1128   APPEARANCEUR HAZY (A)  12/17/2021 1128   LABSPEC 1.017 12/17/2021 1128   PHURINE 5.0 12/17/2021 1128   GLUCOSEU NEGATIVE 12/17/2021 1128   HGBUR MODERATE (A) 12/17/2021 1128   BILIRUBINUR NEGATIVE 12/17/2021 1128   KETONESUR NEGATIVE 12/17/2021 1128   PROTEINUR 100 (A) 12/17/2021 1128   NITRITE NEGATIVE 12/17/2021 1128   LEUKOCYTESUR NEGATIVE 12/17/2021 1128   Sepsis Labs Recent Labs  Lab 12/17/21 0841 12/18/21 0549 12/19/21 0457 12/20/21 0539  WBC 19.1* 14.7* 15.3* 14.0*   Microbiology Recent Results (from the past 240 hour(s))  SARS Coronavirus 2 by RT PCR (hospital order, performed in [place] hospital lab) *cepheid single result test* Anterior Nasal Swab     Status: None   Collection Time: 12/17/21  8:41 AM   Specimen: Anterior Nasal Swab  Result Value Ref Range Status   SARS Coronavirus 2 by RT PCR NEGATIVE NEGATIVE Final    Comment: (NOTE) SARS-CoV-2 target nucleic acids are NOT DETECTED.  The SARS-CoV-2 RNA is generally detectable in upper and lower respiratory specimens during the acute phase of infection. The lowest concentration of SARS-CoV-2 viral copies this assay can detect is 250 copies / mL. A negative result does not preclude SARS-CoV-2 infection and should not be used as the sole basis for treatment or other patient management decisions.  A negative result may occur with improper specimen collection / handling, submission of specimen other than nasopharyngeal swab, presence of viral mutation(s) within the areas targeted by this assay, and inadequate number of viral copies (<250 copies / mL). A negative result must be combined with clinical observations, patient history, and epidemiological information.  Fact Sheet for Patients:   https://www.patel.info/  Fact Sheet for Healthcare Providers: https://hall.com/  This test is not yet approved or  cleared by the [place] and has been authorized for detection and/or diagnosis  of SARS-CoV-2 by FDA under an Emergency Use Authorization (EUA).  This EUA will remain in effect (meaning this test can be used) for the duration of the COVID-19 declaration under Section 564(b)(1) of the Act, 21 U.S.C. section 360bbb-3(b)(1), unless the authorization is terminated or revoked sooner.  Performed at [facility], [address] [place], [place] [address]   Blood culture (routine x 2)     Status: None (Preliminary result)   Collection Time: 12/17/21 11:19 AM   Specimen: BLOOD  Result Value Ref Range Status   Specimen Description   Final    BLOOD Blood Culture results may not be optimal due to an inadequate volume of blood received in culture bottles   Special Requests   Final    BOTTLES DRAWN AEROBIC AND ANAEROBIC BLOOD LEFT HAND   Culture   Final    NO GROWTH 3 DAYS Performed at [facility], [address] [place], [place] [address]    Report Status PENDING  Incomplete  Culture, blood (Routine X 2) w Reflex to ID Panel     Status: None (Preliminary result)   Collection Time: 12/17/21 11:11 PM   Specimen: BLOOD  Result Value Ref Range Status   Specimen Description BLOOD BLOOD LEFT HAND  Final   Special Requests   Final    BOTTLES DRAWN AEROBIC AND ANAEROBIC Blood Culture results may not be optimal due to an excessive volume of blood received in culture bottles   Culture   Final    NO GROWTH 3 DAYS Performed at [facility], [address] [place], [place] [address]    Report Status PENDING  Incomplete     Total time spend on discharging this patient, including the last patient exam, discussing the hospital stay, instructions for ongoing care as it relates to all pertinent caregivers, as well as preparing the medical discharge records, prescriptions, and/or referrals as applicable, is 25 minutes.    Enzo Bi, MD  [facility] 12/21/2021, 9:36 AM

## 2021-12-21 NOTE — Plan of Care (Signed)
  Problem: Fluid Volume: Goal: Hemodynamic stability will improve Outcome: Progressing   Problem: Clinical Measurements: Goal: Diagnostic test results will improve Outcome: Progressing Goal: Signs and symptoms of infection will decrease Outcome: Progressing   Problem: Respiratory: Goal: Ability to maintain adequate ventilation will improve Outcome: Progressing   Problem: Education: Goal: Knowledge of General Education information will improve Description: Including pain rating scale, medication(s)/side effects and non-pharmacologic comfort measures Outcome: Progressing   Problem: Health Behavior/Discharge Planning: Goal: Ability to manage health-related needs will improve Outcome: Progressing   Problem: Clinical Measurements: Goal: Ability to maintain clinical measurements within normal limits will improve Outcome: Progressing Goal: Will remain free from infection Outcome: Progressing Goal: Diagnostic test results will improve Outcome: Progressing Goal: Respiratory complications will improve Outcome: Progressing Goal: Cardiovascular complication will be avoided Outcome: Progressing   Problem: Activity: Goal: Risk for activity intolerance will decrease Outcome: Progressing   Problem: Nutrition: Goal: Adequate nutrition will be maintained Outcome: Progressing   Problem: Coping: Goal: Level of anxiety will decrease Outcome: Progressing   Problem: Elimination: Goal: Will not experience complications related to bowel motility Outcome: Progressing Goal: Will not experience complications related to urinary retention Outcome: Progressing   Problem: Pain Managment: Goal: General experience of comfort will improve Outcome: Progressing   Problem: Safety: Goal: Ability to remain free from injury will improve Outcome: Progressing   Problem: Skin Integrity: Goal: Risk for impaired skin integrity will decrease Outcome: Progressing   Problem: Education: Goal:  Knowledge of the prescribed therapeutic regimen will improve Outcome: Progressing   Problem: Coping: Goal: Ability to identify and develop effective coping behavior will improve Outcome: Progressing   Problem: Clinical Measurements: Goal: Quality of life will improve Outcome: Progressing   Problem: Respiratory: Goal: Verbalizations of increased ease of respirations will increase Outcome: Progressing   Problem: Role Relationship: Goal: Family's ability to cope with current situation will improve Outcome: Progressing Goal: Ability to verbalize concerns, feelings, and thoughts to partner or family member will improve Outcome: Progressing   Problem: Pain Management: Goal: Satisfaction with pain management regimen will improve Outcome: Progressing   

## 2021-12-21 NOTE — Progress Notes (Signed)
                                                     Palliative Care Progress Note, Assessment & Plan   Patient Name: Lucas Burke       Date: 12/21/2021 DOB: 1960-05-06  Age: 61 y.o. MRN#: [id number] Attending Physician: Enzo Bi, MD Primary Care Physician: [facility] Date: 12/17/2021  Reason for Consultation/Follow-up: Establishing goals of care  Subjective: Patient is lying in bed in no apparent distress.  He does not awaken to my presence for light touch.  His respirations are even and unlabored.  No signs of distress or agitation noted.  No family present at side.  HPI: 61 y.o. 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 intake after being found down by 60 year old daughter.   Patient being treated for hypercalcemia of malignancy, AKI, and hypokalemia with hypophosphatemia.   Of note, patient was hospitalized for hypercalcemia and discharged on 12/07/2021 with CEA of 11.1.   Given the patient's poor functional status and poor p.o. intake, PMT was consulted to discuss goals of care. After Kelly discussions yesterday, patient and family agreed to evaluation by hospice inpatient unit. Pt was deemed eligible and bed is available at facility for transfer today, 10/17.  Summary of counseling/coordination of care: After reviewing the patient's chart I assessed the patient at bedside for symptom management.  He is resting comfortably with no brow furrowing, grimacing, fidgeting, or signs of distress noted.  His skin is warm and dry to touch.  His pulses are palpable in upper and lower extremities.  No mottling is noted.   Symptoms appear appropriately managed at  this time.  Plan is for patient to discharge to hospice inpatient facility today.  DNR remains.  PMT will remain available to patient and family throughout the rest of his hospitalization.  Please reengage if discharge to inpatient unit does not occur (hospital death anticipated) or if patient's health deteriorates during hospitalization.  Code Status: DNR  Prognosis: < 2 weeks  Discharge Planning: Hospice facility  Physical Exam Vitals reviewed.  Constitutional:      General: He is not in acute distress.    Appearance: He is not toxic-appearing.  HENT:     Head: Normocephalic.     Mouth/Throat:     Mouth: Mucous membranes are moist.  Cardiovascular:     Rate and Rhythm: Normal rate.  Pulmonary:     Effort: Pulmonary effort is normal.  Abdominal:     Palpations: Abdomen is soft.  Musculoskeletal:     Comments: Generalized weakness  Skin:    General: Skin is warm and dry.     Coloration: Skin is pale.            Palliative Assessment/Data: 20%    Total Time 25 minutes  Greater than 50%  of this time was spent counseling and coordinating care related to the above assessment and plan.  Thank you for allowing the Palliative Medicine Team to assist in the care of this patient.  Rodessa Ilsa Iha, FNP-BC Palliative Medicine Team Team Phone # [phone]

## 2021-12-22 LAB — CULTURE, BLOOD (ROUTINE X 2)
Culture: NO GROWTH
Culture: NO GROWTH

## 2022-01-05 DEATH — deceased

## 2022-01-16 ENCOUNTER — Other Ambulatory Visit: Payer: Self-pay | Admitting: Hematology & Oncology

## 2022-01-16 DIAGNOSIS — M792 Neuralgia and neuritis, unspecified: Secondary | ICD-10-CM

## 2022-01-16 DIAGNOSIS — C3492 Malignant neoplasm of unspecified part of left bronchus or lung: Secondary | ICD-10-CM

## 2024-01-18 IMAGING — DX DG CHEST 1V PORT
1 series · 1 of 1 positions shown · non-contrast
Comparison: Chest x-ray 06/06/2021.

CLINICAL DATA: 60-year-old male with history of shortness of
breath.

EXAM:
PORTABLE CHEST 1 VIEW

[chest]
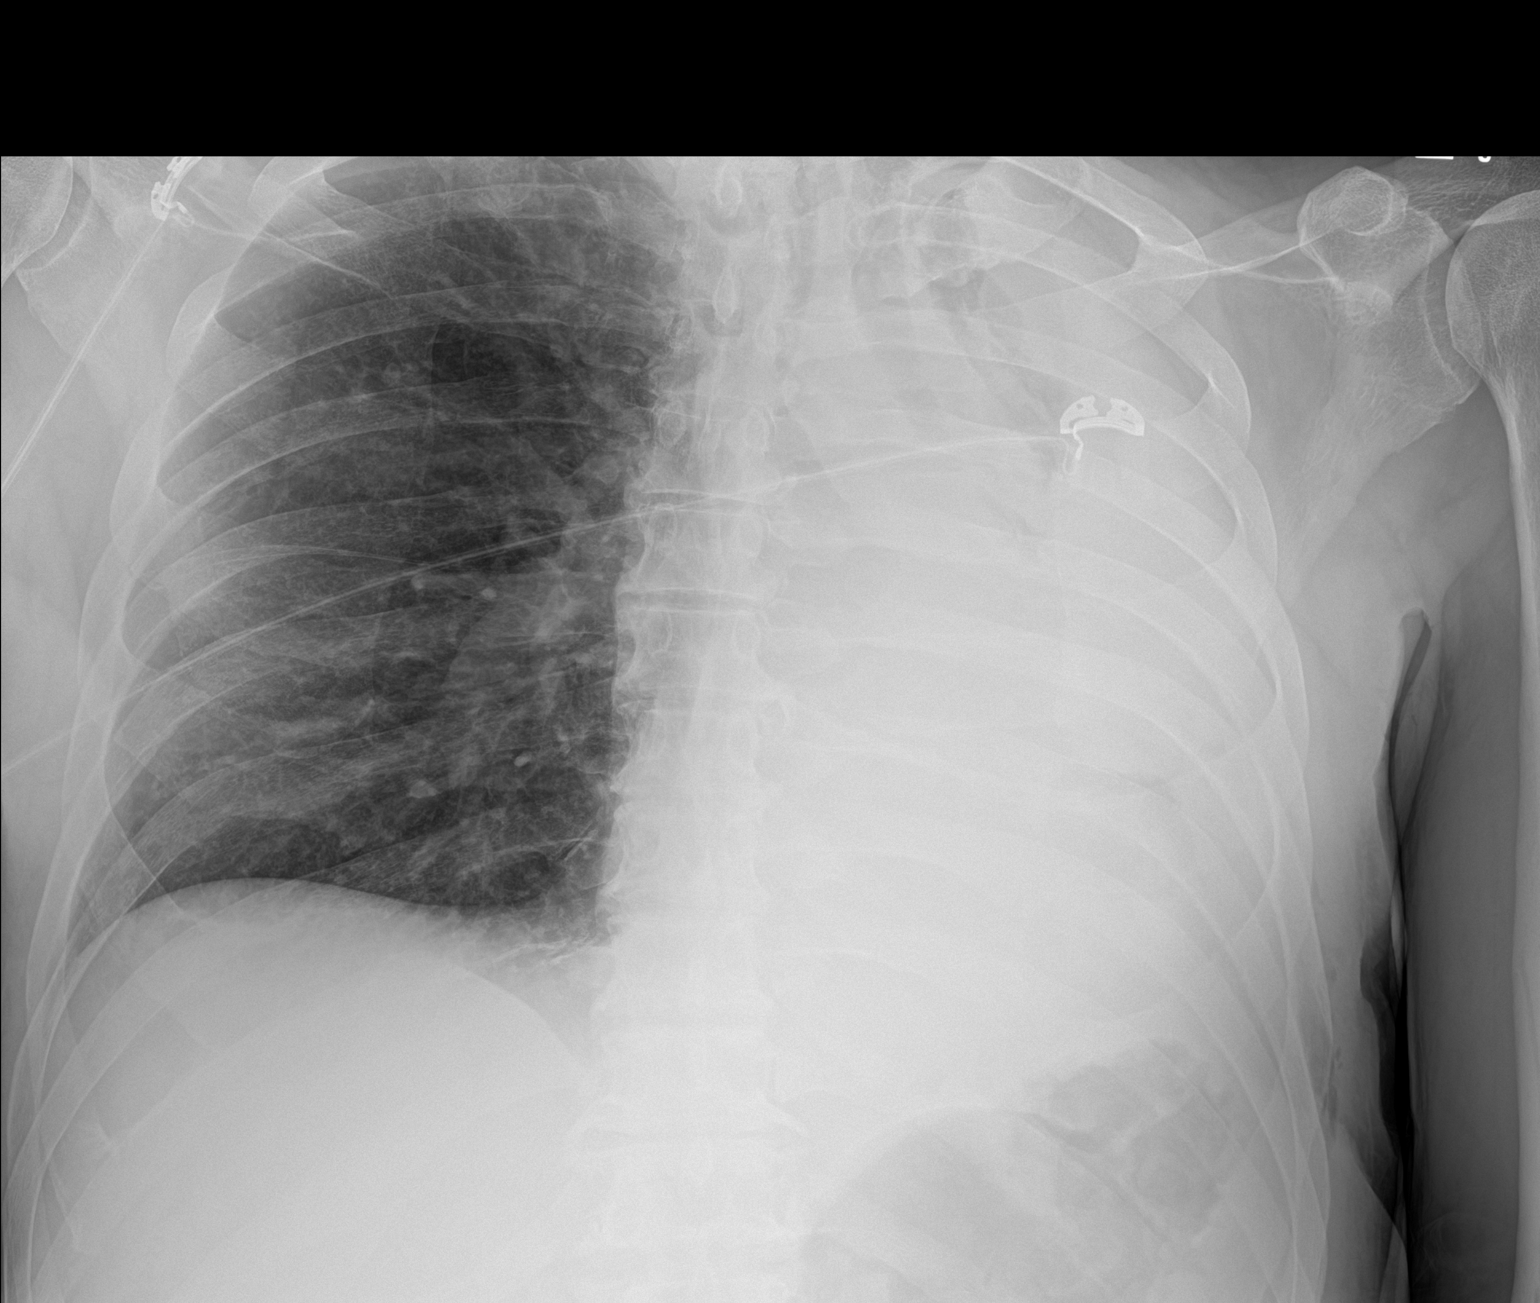

[1 of 1 positions shown; findings below may reference images not displayed]

FINDINGS: Near complete opacification in the left hemithorax with minimal
aerated lung in the upper left hemithorax, similar to the prior
study. Right lung is clear. No right pleural effusion. No
pneumothorax. No evidence of pulmonary edema. Cardiac silhouette is
completely obscured.
IMPRESSION: 1. Unchanged radiographic appearance of the chest, as above. Patient
is status post left-sided decortication on 06/01/2021.
